# Patient Record
Sex: Female | Born: 1990 | Race: Black or African American | Hispanic: No | Marital: Single | Smoking: Never smoker
Health system: Southern US, Community
[De-identification: ages and names within clinical notes are randomized; demographics above are authoritative.]

## PROBLEM LIST (undated history)

## (undated) ENCOUNTER — Inpatient Hospital Stay (HOSPITAL_COMMUNITY): Payer: Self-pay

## (undated) ENCOUNTER — Emergency Department (HOSPITAL_COMMUNITY)

## (undated) DIAGNOSIS — F32A Depression, unspecified: Secondary | ICD-10-CM

## (undated) DIAGNOSIS — R51 Headache: Secondary | ICD-10-CM

## (undated) DIAGNOSIS — F419 Anxiety disorder, unspecified: Secondary | ICD-10-CM

## (undated) DIAGNOSIS — R55 Syncope and collapse: Secondary | ICD-10-CM

## (undated) DIAGNOSIS — D649 Anemia, unspecified: Secondary | ICD-10-CM

## (undated) DIAGNOSIS — G4700 Insomnia, unspecified: Secondary | ICD-10-CM

## (undated) DIAGNOSIS — Z3490 Encounter for supervision of normal pregnancy, unspecified, unspecified trimester: Secondary | ICD-10-CM

## (undated) DIAGNOSIS — R519 Headache, unspecified: Secondary | ICD-10-CM

## (undated) DIAGNOSIS — S62109A Fracture of unspecified carpal bone, unspecified wrist, initial encounter for closed fracture: Secondary | ICD-10-CM

## (undated) DIAGNOSIS — F329 Major depressive disorder, single episode, unspecified: Secondary | ICD-10-CM

## (undated) DIAGNOSIS — B999 Unspecified infectious disease: Secondary | ICD-10-CM

## (undated) DIAGNOSIS — G43909 Migraine, unspecified, not intractable, without status migrainosus: Secondary | ICD-10-CM

## (undated) HISTORY — PX: NO PAST SURGERIES: SHX2092

## (undated) HISTORY — DX: Anxiety disorder, unspecified: F41.9

## (undated) HISTORY — DX: Syncope and collapse: R55

## (undated) HISTORY — DX: Anemia, unspecified: D64.9

## (undated) HISTORY — DX: Fracture of unspecified carpal bone, unspecified wrist, initial encounter for closed fracture: S62.109A

## (undated) HISTORY — DX: Migraine, unspecified, not intractable, without status migrainosus: G43.909

## (undated) HISTORY — DX: Insomnia, unspecified: G47.00

## (undated) HISTORY — DX: Unspecified infectious disease: B99.9

---
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
## 2010-05-31 ENCOUNTER — Emergency Department (HOSPITAL_COMMUNITY)
Admission: EM | Admit: 2010-05-31 | Discharge: 2010-05-31 | Payer: Self-pay | Source: Home / Self Care | Admitting: Emergency Medicine

## 2010-06-07 LAB — POCT PREGNANCY, URINE: Preg Test, Ur: NEGATIVE

## 2011-02-15 LAB — POCT PREGNANCY, URINE
Operator id: 235561
Preg Test, Ur: NEGATIVE

## 2012-03-09 ENCOUNTER — Encounter (HOSPITAL_COMMUNITY): Payer: Self-pay | Admitting: *Deleted

## 2012-03-09 ENCOUNTER — Emergency Department (HOSPITAL_COMMUNITY)
Admission: EM | Admit: 2012-03-09 | Discharge: 2012-03-09 | Disposition: A | Discharge status: Home / Self Care | Payer: Medicaid Other | Attending: Emergency Medicine | Admitting: Emergency Medicine

## 2012-03-09 ENCOUNTER — Emergency Department (HOSPITAL_COMMUNITY): Payer: Medicaid Other

## 2012-03-09 DIAGNOSIS — O200 Threatened abortion: Secondary | ICD-10-CM

## 2012-03-09 DIAGNOSIS — O4690 Antepartum hemorrhage, unspecified, unspecified trimester: Secondary | ICD-10-CM | POA: Insufficient documentation

## 2012-03-09 HISTORY — DX: Encounter for supervision of normal pregnancy, unspecified, unspecified trimester: Z34.90

## 2012-03-09 LAB — POCT I-STAT, CHEM 8
BUN: 10 mg/dL (ref 6–23)
Calcium, Ion: 1.24 mmol/L — ABNORMAL HIGH (ref 1.12–1.23)
Chloride: 106 mEq/L (ref 96–112)
Creatinine, Ser: 0.9 mg/dL (ref 0.50–1.10)
Glucose, Bld: 90 mg/dL (ref 70–99)
HCT: 36 % (ref 36.0–46.0)
Hemoglobin: 12.2 g/dL (ref 12.0–15.0)
Potassium: 3.7 mEq/L (ref 3.5–5.1)
Sodium: 140 mEq/L (ref 135–145)
TCO2: 22 mmol/L (ref 0–100)

## 2012-03-09 LAB — WET PREP, GENITAL
Trich, Wet Prep: NONE SEEN
WBC, Wet Prep HPF POC: NONE SEEN
Yeast Wet Prep HPF POC: NONE SEEN

## 2012-03-09 LAB — CBC WITH DIFFERENTIAL/PLATELET
Basophils Absolute: 0 10*3/uL (ref 0.0–0.1)
Basophils Relative: 0 % (ref 0–1)
Eosinophils Absolute: 0.1 10*3/uL (ref 0.0–0.7)
Eosinophils Relative: 1 % (ref 0–5)
HCT: 32.1 % — ABNORMAL LOW (ref 36.0–46.0)
Hemoglobin: 10.4 g/dL — ABNORMAL LOW (ref 12.0–15.0)
Lymphocytes Relative: 30 % (ref 12–46)
Lymphs Abs: 1.8 10*3/uL (ref 0.7–4.0)
MCH: 24.4 pg — ABNORMAL LOW (ref 26.0–34.0)
MCHC: 32.4 g/dL (ref 30.0–36.0)
MCV: 75.2 fL — ABNORMAL LOW (ref 78.0–100.0)
Monocytes Absolute: 0.6 10*3/uL (ref 0.1–1.0)
Monocytes Relative: 11 % (ref 3–12)
Neutro Abs: 3.3 10*3/uL (ref 1.7–7.7)
Neutrophils Relative %: 57 % (ref 43–77)
Platelets: 307 10*3/uL (ref 150–400)
RBC: 4.27 MIL/uL (ref 3.87–5.11)
RDW: 13.7 % (ref 11.5–15.5)
WBC: 5.8 10*3/uL (ref 4.0–10.5)

## 2012-03-09 LAB — URINALYSIS, ROUTINE W REFLEX MICROSCOPIC
Bilirubin Urine: NEGATIVE
Glucose, UA: NEGATIVE mg/dL
Ketones, ur: NEGATIVE mg/dL
Nitrite: NEGATIVE
Protein, ur: NEGATIVE mg/dL
Specific Gravity, Urine: 1.024 (ref 1.005–1.030)
Urobilinogen, UA: 1 mg/dL (ref 0.0–1.0)
pH: 6.5 (ref 5.0–8.0)

## 2012-03-09 LAB — ABO/RH: ABO/RH(D): A POS

## 2012-03-09 LAB — HCG, QUANTITATIVE, PREGNANCY: hCG, Beta Chain, Quant, S: 14650 m[IU]/mL — ABNORMAL HIGH (ref <5)

## 2012-03-09 LAB — URINE MICROSCOPIC-ADD ON

## 2012-03-09 LAB — RPR: RPR Ser Ql: NONREACTIVE

## 2012-03-09 LAB — POCT PREGNANCY, URINE: Preg Test, Ur: POSITIVE — AB

## 2012-03-09 MED ORDER — PRENATAL VITAMINS (DIS) PO TABS: 1.0000 | ORAL_TABLET | Freq: Every morning | ORAL | Status: DC

## 2012-03-09 NOTE — ED Notes (Signed)
Took an at home pregnancy test on Monday and found out she was pregnant. Tonight noticed spotting when she wiped. LMP "beginning of September". Also reports some back aches. (Denies: fever, nvd, fever, weakness, sob, dizziness or other sx), has an appt with OBGYN tomorrow.

## 2012-03-09 NOTE — ED Provider Notes (Signed)
History     CSN: [id number]  Arrival date & time 03/09/12  0030   First MD Initiated Contact with Patient 03/09/12 [phone]      Chief Complaint  Patient presents with  . Vaginal Bleeding    (Consider location/radiation/quality/duration/timing/severity/associated sxs/prior treatment) HPI Complains of vaginal bleeding onset tonight. Slight amount, less than one pad. No other complaint no abdominal pain patient's: Pregnancy test 4 days ago which was positive. Last normal menstrual period approximately 6 weeks ago. No nausea or vomiting no abdominal pain no other complaint. No treatment prior to coming here no other associated symptoms Past Medical History  Diagnosis Date  . Pregnant    past medical history negative Past OB/GYN history gravida 2 para 0100 with one therapeutic abortion History reviewed. No pertinent past surgical history.  surgical history therapeutic abortion History reviewed. No pertinent family history.  History  Substance Use Topics  . Smoking status: Never Smoker   . Smokeless tobacco: Not on file  . Alcohol Use: No    OB History    Grav Para Term Preterm Abortions TAB SAB Ect Mult Living                  Review of Systems  Constitutional: Negative.   HENT: Negative.   Respiratory: Negative.   Cardiovascular: Negative.   Gastrointestinal: Negative.   Genitourinary: Positive for vaginal bleeding.  Musculoskeletal: Negative.   Skin: Negative.   Neurological: Negative.   Hematological: Negative.   Psychiatric/Behavioral: Negative.     Allergies  Review of patient's allergies indicates no known allergies.  Home Medications  No current outpatient prescriptions on file.  BP 123/75  Temp 98.7 F (37.1 C) (Oral)  Resp 18  SpO2 100%  LMP 01/25/2012  Physical Exam  Nursing note and vitals reviewed. Constitutional: She appears well-developed and well-nourished.  HENT:  Head: Normocephalic and atraumatic.  Eyes: Conjunctivae normal are normal.  Pupils are equal, round, and reactive to light.  Neck: Neck supple. No tracheal deviation present. No thyromegaly present.  Cardiovascular: Normal rate and regular rhythm.   No murmur heard. Pulmonary/Chest: Effort normal and breath sounds normal.  Abdominal: Soft. Bowel sounds are normal. She exhibits no distension. There is no tenderness.  Genitourinary:       No external lesion minimal blood in vault. No cervical motion tenderness. Cervical os closed. No adnexal masses or tenderness  Musculoskeletal: Normal range of motion. She exhibits no edema and no tenderness.  Neurological: She is alert. Coordination normal.  Skin: Skin is warm and dry. No rash noted.  Psychiatric: She has a normal mood and affect.    ED Course  Procedures (including critical care time)  Labs Reviewed  URINALYSIS, ROUTINE W REFLEX MICROSCOPIC - Abnormal; Notable for the following:    Hgb urine dipstick SMALL (*)     Leukocytes, UA SMALL (*)     All other components within normal limits  CBC WITH DIFFERENTIAL - Abnormal; Notable for the following:    Hemoglobin 10.4 (*)     HCT 32.1 (*)     MCV 75.2 (*)     MCH 24.4 (*)     All other components within normal limits  POCT PREGNANCY, URINE - Abnormal; Notable for the following:    Preg Test, Ur POSITIVE (*)     All other components within normal limits  POCT I-STAT, CHEM 8 - Abnormal; Notable for the following:    Calcium, Ion 1.24 (*)     All other components within  normal limits  URINE MICROSCOPIC-ADD ON   No results found.  Results for orders placed during the hospital encounter of 03/09/12  URINALYSIS, ROUTINE W REFLEX MICROSCOPIC      Component Value Range   Color, Urine YELLOW  YELLOW   APPearance CLEAR  CLEAR   Specific Gravity, Urine 1.024  1.005 - 1.030   pH 6.5  5.0 - 8.0   Glucose, UA NEGATIVE  NEGATIVE mg/dL   Hgb urine dipstick SMALL (*) NEGATIVE   Bilirubin Urine NEGATIVE  NEGATIVE   Ketones, ur NEGATIVE  NEGATIVE mg/dL   Protein,  ur NEGATIVE  NEGATIVE mg/dL   Urobilinogen, UA 1.0  0.0 - 1.0 mg/dL   Nitrite NEGATIVE  NEGATIVE   Leukocytes, UA SMALL (*) NEGATIVE  CBC WITH DIFFERENTIAL      Component Value Range   WBC 5.8  4.0 - 10.5 K/uL   RBC 4.27  3.87 - 5.11 MIL/uL   Hemoglobin 10.4 (*) 12.0 - 15.0 g/dL   HCT 16.1 (*) 09.6 - 04.5 %   MCV 75.2 (*) 78.0 - 100.0 fL   MCH 24.4 (*) 26.0 - 34.0 pg   MCHC 32.4  30.0 - 36.0 g/dL   RDW 40.9  81.1 - 91.4 %   Platelets 307  150 - 400 K/uL   Neutrophils Relative 57  43 - 77 %   Neutro Abs 3.3  1.7 - 7.7 K/uL   Lymphocytes Relative 30  12 - 46 %   Lymphs Abs 1.8  0.7 - 4.0 K/uL   Monocytes Relative 11  3 - 12 %   Monocytes Absolute 0.6  0.1 - 1.0 K/uL   Eosinophils Relative 1  0 - 5 %   Eosinophils Absolute 0.1  0.0 - 0.7 K/uL   Basophils Relative 0  0 - 1 %   Basophils Absolute 0.0  0.0 - 0.1 K/uL  POCT PREGNANCY, URINE      Component Value Range   Preg Test, Ur POSITIVE (*) NEGATIVE  POCT I-STAT, CHEM 8      Component Value Range   Sodium 140  135 - 145 mEq/L   Potassium 3.7  3.5 - 5.1 mEq/L   Chloride 106  96 - 112 mEq/L   BUN 10  6 - 23 mg/dL   Creatinine, Ser 7.82  0.50 - 1.10 mg/dL   Glucose, Bld 90  70 - 99 mg/dL   Calcium, Ion 9.56 (*) 1.12 - 1.23 mmol/L   TCO2 22  0 - 100 mmol/L   Hemoglobin 12.2  12.0 - 15.0 g/dL   HCT 21.3  08.6 - 57.8 %  URINE MICROSCOPIC-ADD ON      Component Value Range   Squamous Epithelial / LPF RARE  RARE   WBC, UA 3-6  <3 WBC/hpf   RBC / HPF 0-2  <3 RBC/hpf   Bacteria, UA RARE  RARE  WET PREP, GENITAL      Component Value Range   Yeast Wet Prep HPF POC NONE SEEN  NONE SEEN   Trich, Wet Prep NONE SEEN  NONE SEEN   Clue Cells Wet Prep HPF POC FEW (*) NONE SEEN   WBC, Wet Prep HPF POC NONE SEEN  NONE SEEN  ABO/RH      Component Value Range   ABO/RH(D) A POS     No rh immune globuloin NOT A RH IMMUNE GLOBULIN CANDIDATE, PT RH POSITIVE    HCG, QUANTITATIVE, PREGNANCY      Component Value Range  hCG, Beta Chain,  Quant, S 14650 (*) <5 mIU/mL   US Ob Comp Less 14 Wks  03/09/2012  *RADIOLOGY REPORT*  Clinical Data: Vaginal bleeding last night.  Positive urine pregnancy test.  Estimated gestational age by LMP is 6 weeks 3 days.  OBSTETRIC <14 WK [place] AND TRANSVAGINAL OB US  Technique:  Both transabdominal and transvaginal ultrasound examinations were performed for complete evaluation of the gestation as well as the maternal uterus, adnexal regions, and pelvic cul-de-sac.  Transvaginal technique was performed to assess early pregnancy.  Comparison:  None.  Intrauterine gestational sac:  A single intrauterine gestational sac is visualized. Yolk sac: Yolk sac is visualized. Embryo: Embryo is not visualized. Cardiac Activity: Not visualized Heart Rate: N/A bpm  MSD: 10 mm  5 w 5 d           [place] EDC: 11/04/2012  Maternal uterus/adnexae: The uterus is anteverted.  No myometrial masses are demonstrated. No subchorionic hemorrhage.  Both ovaries are visualized and appear normal in size and shape.  No abnormal adnexal masses.  IMPRESSION: Single intrauterine gestational sac.  Estimated gestational age by mean sac diameter is 5 weeks 5 days.  The yolk sac is visualized but the fetal pole is not visualized.  This is likely due to early gestational age.  Follow-up is recommended with serial quantitative beta HCG levels and / or short-term ultrasound in 10 days as clinically indicated.   Original Report Authenticated By: Marlon Pel, M.D.    US Ob Transvaginal  03/09/2012  *RADIOLOGY REPORT*  Clinical Data: Vaginal bleeding last night.  Positive urine pregnancy test.  Estimated gestational age by LMP is 6 weeks 3 days.  OBSTETRIC <14 WK [place] AND TRANSVAGINAL OB US  Technique:  Both transabdominal and transvaginal ultrasound examinations were performed for complete evaluation of the gestation as well as the maternal uterus, adnexal regions, and pelvic cul-de-sac.  Transvaginal technique was performed to assess early pregnancy.   Comparison:  None.  Intrauterine gestational sac:  A single intrauterine gestational sac is visualized. Yolk sac: Yolk sac is visualized. Embryo: Embryo is not visualized. Cardiac Activity: Not visualized Heart Rate: N/A bpm  MSD: 10 mm  5 w 5 d           [place] EDC: 11/04/2012  Maternal uterus/adnexae: The uterus is anteverted.  No myometrial masses are demonstrated. No subchorionic hemorrhage.  Both ovaries are visualized and appear normal in size and shape.  No abnormal adnexal masses.  IMPRESSION: Single intrauterine gestational sac.  Estimated gestational age by mean sac diameter is 5 weeks 5 days.  The yolk sac is visualized but the fetal pole is not visualized.  This is likely due to early gestational age.  Follow-up is recommended with serial quantitative beta HCG levels and / or short-term ultrasound in 10 days as clinically indicated.   Original Report Authenticated By: Marlon Pel, M.D.     No diagnosis found.  5:50 AM patient resting comfortably, asymptomatic  MDM  Patient has her first prenatal visit scheduled within the next day. Plan prescription prenatal vitamins Diagnosis threatened miscarriage        Doug Sou, MD 03/09/12 [id number]

## 2012-03-10 LAB — GC/CHLAMYDIA PROBE AMP, GENITAL
Chlamydia, DNA Probe: POSITIVE — AB
GC Probe Amp, Genital: NEGATIVE

## 2012-03-11 NOTE — ED Notes (Signed)
+  Chlamydia. Chart sent to EDP office for review. DHHS attached. 

## 2012-03-13 NOTE — ED Notes (Signed)
Prescription for azithromycin gm 1 po single dose, no refills per [facility], pa-c called in to [facility] at [id number].

## 2012-05-07 ENCOUNTER — Ambulatory Visit (INDEPENDENT_AMBULATORY_CARE_PROVIDER_SITE_OTHER): Payer: Medicaid Other | Admitting: Obstetrics and Gynecology

## 2012-05-07 DIAGNOSIS — Z331 Pregnant state, incidental: Secondary | ICD-10-CM

## 2012-05-07 DIAGNOSIS — Z3689 Encounter for other specified antenatal screening: Secondary | ICD-10-CM

## 2012-05-07 LAB — POCT URINALYSIS DIPSTICK
Bilirubin, UA: NEGATIVE
Blood, UA: NEGATIVE
Glucose, UA: NEGATIVE
Ketones, UA: NEGATIVE
Spec Grav, UA: <=1.005
Urobilinogen, UA: NEGATIVE
pH, UA: 8

## 2012-05-07 MED ORDER — PROMETHAZINE HCL 25 MG PO TABS: 25.0000 mg | ORAL_TABLET | Freq: Four times a day (QID) | ORAL | Status: DC | PRN

## 2012-05-07 NOTE — Progress Notes (Signed)
NOB interview. U/S from [facility]  03/09/12 results reviewed by phone with VL.  Advised FHT check.  FHT audible at 144 BPM. Pt requesting Rx for nausea.

## 2012-05-08 LAB — PRENATAL PANEL VII
Antibody Screen: NEGATIVE
Basophils Absolute: 0 10*3/uL (ref 0.0–0.1)
Basophils Relative: 0 % (ref 0–1)
Eosinophils Absolute: 0.1 10*3/uL (ref 0.0–0.7)
Eosinophils Relative: 1 % (ref 0–5)
HCT: 35.3 % — ABNORMAL LOW (ref 36.0–46.0)
HIV: NONREACTIVE
Hemoglobin: 11.2 g/dL — ABNORMAL LOW (ref 12.0–15.0)
Hepatitis B Surface Ag: NEGATIVE
Lymphocytes Relative: 22 % (ref 12–46)
Lymphs Abs: 1.3 10*3/uL (ref 0.7–4.0)
MCH: 24.4 pg — ABNORMAL LOW (ref 26.0–34.0)
MCHC: 31.7 g/dL (ref 30.0–36.0)
MCV: 76.9 fL — ABNORMAL LOW (ref 78.0–100.0)
Monocytes Absolute: 0.6 10*3/uL (ref 0.1–1.0)
Monocytes Relative: 10 % (ref 3–12)
Neutro Abs: 4 10*3/uL (ref 1.7–7.7)
Neutrophils Relative %: 67 % (ref 43–77)
Platelets: 309 10*3/uL (ref 150–400)
RBC: 4.59 MIL/uL (ref 3.87–5.11)
RDW: 14.9 % (ref 11.5–15.5)
Rh Type: POSITIVE
Rubella: 3.65 Index — ABNORMAL HIGH (ref <0.90)
WBC: 6 10*3/uL (ref 4.0–10.5)

## 2012-05-09 LAB — CULTURE, OB URINE
Colony Count: NO GROWTH
Organism ID, Bacteria: NO GROWTH

## 2012-05-09 LAB — HEMOGLOBINOPATHY EVALUATION
Hemoglobin Other: 0 %
Hgb A2 Quant: 2.5 % (ref 2.2–3.2)
Hgb A: 97.5 % (ref 96.8–97.8)
Hgb F Quant: 0 % (ref 0.0–2.0)
Hgb S Quant: 0 %

## 2012-05-15 ENCOUNTER — Encounter (HOSPITAL_COMMUNITY): Payer: Self-pay | Admitting: *Deleted

## 2012-05-15 ENCOUNTER — Telehealth: Payer: Self-pay | Admitting: Obstetrics and Gynecology

## 2012-05-15 ENCOUNTER — Emergency Department (HOSPITAL_COMMUNITY)
Admission: EM | Admit: 2012-05-15 | Discharge: 2012-05-15 | Disposition: A | Discharge status: Home / Self Care | Payer: Medicaid Other | Source: Home / Self Care | Attending: Emergency Medicine | Admitting: Emergency Medicine

## 2012-05-15 DIAGNOSIS — J111 Influenza due to unidentified influenza virus with other respiratory manifestations: Secondary | ICD-10-CM

## 2012-05-15 LAB — POCT RAPID STREP A: Streptococcus, Group A Screen (Direct): NEGATIVE

## 2012-05-15 MED ORDER — OSELTAMIVIR PHOSPHATE 75 MG PO CAPS
75.0000 mg | ORAL_CAPSULE | Freq: Two times a day (BID) | ORAL | Status: DC
Start: 2012-05-15 — End: 2012-08-05

## 2012-05-15 MED ORDER — CHLORPHENIRAMINE TANNATE 12 MG PO TABS: 1.0000 | ORAL_TABLET | Freq: Two times a day (BID) | ORAL | Status: DC

## 2012-05-15 NOTE — ED Provider Notes (Signed)
Chief Complaint  Patient presents with  . Sore Throat    History of Present Illness:   The patient is a 21 year old female who is [redacted] weeks pregnant. She presents today with a three-day history of dry cough, aching in her chest, fever of up to 101.3, myalgias, sore throat, nasal congestion, and rhinorrhea. She has had no pregnancy complications. She's been followed by [facility] and Gynecology. Her only medication is prenatal vitamins.  Review of Systems:  Other than noted above, the patient denies any of the following symptoms. Systemic:  No fever, chills, sweats, fatigue, myalgias, headache, or anorexia. Eye:  No redness, pain or drainage. ENT:  No earache, ear congestion, nasal congestion, sneezing, rhinorrhea, sinus pressure, sinus pain, post nasal drip, or sore throat. Lungs:  No cough, sputum production, wheezing, shortness of breath, or chest pain. GI:  No abdominal pain, nausea, vomiting, or diarrhea.  PMFSH:  Past medical history, family history, social history, meds, and allergies were reviewed.  Physical Exam:   Vital signs:  BP 120/80  Pulse 98  Temp 98.6 F (37 C) (Oral)  SpO2 100%  LMP 01/29/2012 General:  Alert, in no distress. Eye:  No conjunctival injection or drainage. Lids were normal. ENT:  TMs and canals were normal, without erythema or inflammation.  Nasal mucosa was clear and uncongested, without drainage.  Mucous membranes were moist.  Pharynx was clear, without exudate or drainage.  There were no oral ulcerations or lesions. Neck:  Supple, no adenopathy, tenderness or mass. Lungs:  No respiratory distress.  Lungs were clear to auscultation, without wheezes, rales or rhonchi.  Breath sounds were clear and equal bilaterally.  Heart:  Regular rhythm, without gallops, murmers or rubs. Skin:  Clear, warm, and dry, without rash or lesions.  Labs:   Results for orders placed during the hospital encounter of 05/15/12  POCT RAPID STREP A ([facility] URG CARE  ONLY)      Component Value Range   Streptococcus, Group A Screen (Direct) NEGATIVE  NEGATIVE    Assessment:  The encounter diagnosis was Influenza-like illness.  Plan:   1.  The following meds were prescribed:   New Prescriptions   CHLORPHENIRAMINE TANNATE 12 MG TABS    Take 1 tablet (12 mg total) by mouth 2 (two) times daily at 10 AM and 5 PM.   OSELTAMIVIR (TAMIFLU) 75 MG CAPSULE    Take 1 capsule (75 mg total) by mouth every 12 (twelve) hours.   2.  The patient was instructed in symptomatic care and handouts were given. 3.  The patient was told to return if becoming worse in any way, if no better in 3 or 4 days, and given some red flag symptoms that would indicate earlier return.   Reuben Likes, MD 05/15/12 1311

## 2012-05-15 NOTE — ED Notes (Signed)
Pt  Reports  Symptoms  Of  sorethroat   Cough          Congested       Symptoms  X  4 days       Pt  Is  pregnanat  About 4  Months     -  She  Is   Awake  And  Alert  And  Oriented  And  Is  Speaking in  Complete  sentances

## 2012-05-15 NOTE — Telephone Encounter (Signed)
Returned pt's call. States 04/2312 had T-101.3 and today is 100.2. Questioning if OK to take Mucinex. Advised is OK. May also take Delsym for cough. Pt states has sore throat which makes it difficult to eat but is drinking fluids. Suggested saline gargles, Tylenol or Ibuprofen. Consult with ML. Pt advised to be seen at [facility] today. Pt verbalizes comprehension.

## 2012-05-23 NOTE — L&D Delivery Note (Signed)
Delivery Note At 4:44 AM a viable female, "Anne Baxter", was delivered via Vaginal, Spontaneous Delivery (Presentation: ; Occiput Anterior).  APGAR: 8, 9; weight .   Placenta status: Intact, Spontaneous.  Cord:  with the following complications: None.  Cord pH: NA  Anesthesia: Epidural  Episiotomy: None Lacerations: Periurethral Suture Repair: 3.0 monocryl Est. Blood Loss (mL):  250 cc  Mom to postpartum.  Baby to skin to skin.Marland Kitchen  Filipe Greathouse 10/31/2012, 5:09 AM

## 2012-06-07 ENCOUNTER — Encounter: Payer: Self-pay | Admitting: Obstetrics and Gynecology

## 2012-06-07 ENCOUNTER — Ambulatory Visit: Payer: Medicaid Other | Admitting: Obstetrics and Gynecology

## 2012-06-07 ENCOUNTER — Ambulatory Visit: Payer: Medicaid Other

## 2012-06-07 VITALS — BP 130/70 | Wt 126.0 lb

## 2012-06-07 DIAGNOSIS — Z331 Pregnant state, incidental: Secondary | ICD-10-CM

## 2012-06-07 DIAGNOSIS — Z3689 Encounter for other specified antenatal screening: Secondary | ICD-10-CM

## 2012-06-07 LAB — POCT WET PREP (WET MOUNT): Clue Cells Wet Prep Whiff POC: POSITIVE

## 2012-06-07 MED ORDER — METRONIDAZOLE 0.75 % VA GEL: 1.0000 | Freq: Every day | VAGINAL | Status: DC

## 2012-06-07 NOTE — Progress Notes (Signed)
[facility] NEW OB EXAMINATION   Anne Baxter is a 22 y.o. female, G2P0010, who presents at [redacted]w[redacted]d gestation for a new obstetrical examination. The patient was treated with Tamiflu for influenza symptoms.  She is better now.  The patient had an ultrasound performed on March 09, 2012 at which time she was 5 weeks and 5 days pregnant.  Her due date is November 04, 2012.  The patient complains of headaches.  She has not taken medication.  She did not have headaches prior to her pregnancy.  The following portions of the patient's history were reviewed and updated as appropriate: allergies, current medications, past family history, past medical history, past social history, past surgical history and problem list.  OB History    Grav Para Term Preterm Abortions TAB SAB Ect Mult Living   2    1           Past Medical History  Diagnosis Date  . Pregnant   . Infection 02/2017    CHLAMYDIA  . Anemia 2010    FE  . Wrist fracture 2009 X2    No past surgical history on file.  Family History  Problem Relation Age of Onset  . Kidney disease Mother     FAILURE  . Heart disease Mother     CHF  . Early death Mother 35  . Hypertension Mother   . Thrombophlebitis Mother   . Hyperlipidemia Maternal Grandmother   . Hypertension Maternal Grandmother     Social History:  reports that she has never smoked. She has never used smokeless tobacco. She reports that she drinks alcohol. She reports that she does not use illicit drugs.  Allergies: No Known Allergies  Medications: prenatal vitamins   Objective:    BP 130/70  Wt 126 lb (57.153 kg)  LMP 01/29/2012    Weight:  Wt Readings from Last 1 Encounters:  06/07/12 126 lb (57.153 kg)          BMI: There is no height on file to calculate BMI.  General Appearance: Alert, appropriate appearance for age. No acute distress HEENT: Grossly normal Neck / Thyroid: Supple, no masses, nodes or enlargement Lungs: clear to auscultation  bilaterally Back: No CVA tenderness Breast Exam: No masses or nodes.No dimpling, nipple retraction or discharge. Cardiovascular: Regular rate and rhythm. S1, S2, no murmur Gastrointestinal: Soft, non-tender, no masses or organomegaly.                               Fundal height: 18 weeks                               Fetal heart tones audible: yes  ++++++++++++++++++++++++++++++++++++++++++++++++++++++++  Pelvic Exam: External genitalia: normal general appearance Vaginal: normal without tenderness, induration or masses and relaxation: No Cervix: normal appearance Adnexa: normal bimanual exam Uterus: gravid, nontender, 18 weeks size  ++++++++++++++++++++++++++++++++++++++++++++++++++++++++  Lymphatic Exam: Non-palpable nodes in neck, clavicular, axillary, or inguinal regions Neurologic: Normal speech, no tremor  Psychiatric: Alert and oriented, appropriate affect.  Prenatal labs: ABO, Rh: A/POS/-- (12/16 0932) Antibody: NEG (12/16 0932) Rubella:  immune RPR: NON REAC (12/16 0932)  HBsAg: NEGATIVE (12/16 0932)  HIV: NON REACTIVE (12/16 0932)  GBS:   pending until the third trimester Hemoglobin: 11.2 Platelets: 309,000 Sickle cell screen: normal adult hemoglobin Urine culture: Negative  Anatomy ultrasound: Single gestation, normal fluid, normal anatomy, female  infant, 240 g (43rd percentile), cervix 3.43 cm.  Wet Prep:           If Whiff:                     Positive                               Clue cells:             yes                              PH:                        4.5                              Yeast:                    no                              Trichomoniasis:    no   Assessment:   22 y.o. female G2P0010 at [redacted]w[redacted]d gestation ( EDC is November 04, 2012 ) by: Normal Last menstrual period: yes Ultrasound:                               yes                                Late prenatal care  Bacterial vaginosis   Plan:    GC and Chlamydia sent.  Pap  smear sent  MetroGel vaginal each night for 5 nights  We discussed routine pregnancy issues:  Toxoplasmosis was reviewed.  The patient was told to avoid cat liter boxes and feces.  The patient was told to avoid predator fish including tuna because of our concerns for mercury consumption.  The patient was told to avoid soft cheeses.  The patient was told to be sure that all lunch meats are well cooked.  Genetic screening was discussed. Quad screen today  Our model for pregnancy management was reviewed.  Proper diet and exercise reviewed.  Return to office in 4 weeks.  Medications include:  Prenatal vitamins MetroGel vaginal  Leonard Schwartz M.D.

## 2012-06-07 NOTE — Progress Notes (Signed)
Pt c/o daily headaches ?

## 2012-06-08 LAB — AFP, QUAD SCREEN
AFP: 36.2 IU/mL
Age Alone: 1:1150 {titer}
Curr Gest Age: 18.4 wks.days
Down Syndrome Scr Risk Est: 1:4150 {titer}
HCG, Total: 22929 m[IU]/mL
INH: 158.9 pg/mL
Interpretation-AFP: NEGATIVE
MoM for AFP: 0.74
MoM for INH: 0.84
MoM for hCG: 1.17
Open Spina bifida: NEGATIVE
Osb Risk: <1:54600 {titer}
Tri 18 Scr Risk Est: NEGATIVE
Trisomy 18 (Edward) Syndrome Interp.: 1:63400 {titer}
uE3 Mom: 0.97
uE3 Value: 1 ng/mL

## 2012-06-08 LAB — US OB COMP + 14 WK

## 2012-06-11 LAB — PAP IG, CT-NG, RFX HPV ASCU
Chlamydia Probe Amp: NEGATIVE
GC Probe Amp: NEGATIVE

## 2012-06-14 ENCOUNTER — Encounter: Payer: Self-pay | Admitting: Obstetrics and Gynecology

## 2012-06-14 DIAGNOSIS — IMO0002 Reserved for concepts with insufficient information to code with codable children: Secondary | ICD-10-CM | POA: Insufficient documentation

## 2012-07-05 ENCOUNTER — Encounter: Payer: Medicaid Other | Admitting: Obstetrics and Gynecology

## 2012-07-16 ENCOUNTER — Ambulatory Visit: Payer: Medicaid Other | Admitting: Obstetrics and Gynecology

## 2012-07-16 ENCOUNTER — Encounter: Payer: Self-pay | Admitting: Obstetrics and Gynecology

## 2012-07-16 VITALS — BP 128/60 | Wt 134.0 lb

## 2012-07-16 DIAGNOSIS — Z3490 Encounter for supervision of normal pregnancy, unspecified, unspecified trimester: Secondary | ICD-10-CM

## 2012-07-16 DIAGNOSIS — Z331 Pregnant state, incidental: Secondary | ICD-10-CM

## 2012-07-16 DIAGNOSIS — Z23 Encounter for immunization: Secondary | ICD-10-CM

## 2012-07-16 NOTE — Progress Notes (Signed)
[redacted]w[redacted]d Pt would like rx to help her sleep. Can use otc benadryl Glucola given today by CMA.  Pt opts to get blood drawn today rather than at 28 wks.  Will not repeat Hgb or RPR today.  Will do both after 30 weeks Flu vaccine given.  Pt had flu sx earlier in preg and took Tamiflu.  Explained that vaccination can still be useful.  She accepts flu vaccine today.

## 2012-07-17 LAB — GLUCOSE TOLERANCE, 1 HOUR (50G) W/O FASTING: Glucose, 1 Hour GTT: 96 mg/dL (ref 70–140)

## 2012-08-05 ENCOUNTER — Inpatient Hospital Stay (HOSPITAL_COMMUNITY)
Admission: AD | Admit: 2012-08-05 | Discharge: 2012-08-05 | Disposition: A | Discharge status: Home / Self Care | Payer: Medicaid Other | Source: Ambulatory Visit | Attending: Obstetrics and Gynecology | Admitting: Obstetrics and Gynecology

## 2012-08-05 ENCOUNTER — Encounter (HOSPITAL_COMMUNITY): Payer: Self-pay | Admitting: *Deleted

## 2012-08-05 ENCOUNTER — Inpatient Hospital Stay (HOSPITAL_COMMUNITY): Payer: Medicaid Other

## 2012-08-05 DIAGNOSIS — R51 Headache: Secondary | ICD-10-CM | POA: Insufficient documentation

## 2012-08-05 DIAGNOSIS — M549 Dorsalgia, unspecified: Secondary | ICD-10-CM | POA: Insufficient documentation

## 2012-08-05 DIAGNOSIS — O368190 Decreased fetal movements, unspecified trimester, not applicable or unspecified: Secondary | ICD-10-CM | POA: Insufficient documentation

## 2012-08-05 LAB — URINALYSIS, ROUTINE W REFLEX MICROSCOPIC
Bilirubin Urine: NEGATIVE
Glucose, UA: NEGATIVE mg/dL
Hgb urine dipstick: NEGATIVE
Ketones, ur: NEGATIVE mg/dL
Leukocytes, UA: NEGATIVE
Nitrite: NEGATIVE
Protein, ur: NEGATIVE mg/dL
Specific Gravity, Urine: 1.015 (ref 1.005–1.030)
Urobilinogen, UA: 0.2 mg/dL (ref 0.0–1.0)
pH: 7.5 (ref 5.0–8.0)

## 2012-08-05 MED ORDER — ACETAMINOPHEN 500 MG PO TABS
1000.0000 mg | ORAL_TABLET | Freq: Once | ORAL | Status: AC
  Administered 2012-08-05: 1000 mg via ORAL
  Filled 2012-08-05: qty 2

## 2012-08-05 NOTE — MAU Note (Signed)
Pt presents with back pain, headache, and decrease in fetal movement. States she has been really stressed and not really felt movement since last night

## 2012-08-05 NOTE — MAU Provider Note (Signed)
History     CSN: [id number]  Arrival date and time: 08/05/12 1241   None     Chief Complaint  Patient presents with  . Decreased Fetal Movement  . Headache  . Back Pain   HPI Comments: Pt is a G2P0 at 27wks that arrived unannounced w cc decreased FM, reports not feeling any movement today. About 30 minutes after EFM initiated, pt stated movement now feeling like usual. She also c/o headache today,  has not tried any OTC tx, last meal "a couple hours"  before arrival. Also c/o backache, points to mid upper back and between shoulder blades. Denies any ctx, VB or LOF.   Headache  Associated symptoms include back pain. Pertinent negatives include no abdominal pain, blurred vision, dizziness, nausea, photophobia or vomiting.  Back Pain Associated symptoms include headaches. Pertinent negatives include no abdominal pain or chest pain.      Past Medical History  Diagnosis Date  . Pregnant   . Infection 02/2017    CHLAMYDIA  . Anemia 2010    FE  . Wrist fracture 2009 X2    History reviewed. No pertinent past surgical history.  Family History  Problem Relation Age of Onset  . Kidney disease Mother     FAILURE  . Heart disease Mother     CHF  . Early death Mother 85  . Hypertension Mother   . Thrombophlebitis Mother   . Hyperlipidemia Maternal Grandmother   . Hypertension Maternal Grandmother     History  Substance Use Topics  . Smoking status: Never Smoker   . Smokeless tobacco: Never Used  . Alcohol Use: Yes     Comment: RARELY    Allergies: No Known Allergies  Prescriptions prior to admission  Medication Sig Dispense Refill  . Prenatal Vitamins (DIS) TABS Take 1 tablet by mouth every morning.  30 tablet  0    Review of Systems  Eyes: Negative for blurred vision, double vision and photophobia.  Respiratory: Negative for shortness of breath.   Cardiovascular: Negative for chest pain.  Gastrointestinal: Negative for heartburn, nausea, vomiting and abdominal  pain.  Musculoskeletal: Positive for back pain.  Neurological: Positive for headaches. Negative for dizziness.  All other systems reviewed and are negative.   Physical Exam   Blood pressure 121/76, pulse 76, temperature 98.2 F (36.8 C), resp. rate 18, height 5' 4.5" (1.638 m), weight 138 lb (62.596 kg), last menstrual period 01/29/2012.  Physical Exam  Nursing note and vitals reviewed. Constitutional: She is oriented to [place], place, and time. She appears well-developed and well-nourished.  HENT:  Head: Normocephalic.  Eyes: Pupils are equal, round, and reactive to light.  Neck: Normal range of motion.  Cardiovascular: Normal rate.   Respiratory: Effort normal.  GI: Soft. There is no tenderness.  Genitourinary:  Deferred   Musculoskeletal: Normal range of motion. She exhibits no edema and no tenderness.  Neurological: She is alert and oriented to [place], place, and time. She has normal reflexes.  Skin: Skin is warm and dry.  Psychiatric: She has a normal mood and affect. Her behavior is normal.   ? decel upon initiation of EFM, FHR baseline 150, several spontaneous variables noted w lowest nadir to 130, lasting approx 40-60 sec.  Mod variability, non-reactive Just Prior to [place] and after maternal position change, variables resolved and FHR reactive cat 1 toco - quiet  [place] - S=D Breech  BPP 6/8, fetal breathing noted, but not-sustained AFI-11cm - 19%  MAU Course  Procedures  Assessment and Plan  IUP at 27wks FHR initially cat 2 BPP 6/8  Headache  PO tylenol   Kailin Leu M 08/05/2012, 5:22 PM   Addendum: at 1815  S: pt denies headache, reports normal fetal movements since arrival   O: FHR cat 1, reactive for gestational age toco - quiet  A: IUP at 27wks FHR overall reassuring w BPP 6/8 No evidence of pre-eclampsia  P: c/w Dr Normand Sloop Pt offered further monitoring w repeat of BPP at 6 hours, she declined rv'd [facility]KC, sx's pre-eclampsia and PTL Pt dc'd  home in stable condition  Instructions for routine follow up as scheduled   S.Antwion Carpenter, CNM

## 2012-09-15 ENCOUNTER — Inpatient Hospital Stay (HOSPITAL_COMMUNITY)
Admission: AD | Admit: 2012-09-15 | Discharge: 2012-09-15 | Disposition: A | Discharge status: Home / Self Care | Payer: Medicaid Other | Source: Ambulatory Visit | Attending: Obstetrics and Gynecology | Admitting: Obstetrics and Gynecology

## 2012-09-15 ENCOUNTER — Encounter (HOSPITAL_COMMUNITY): Payer: Self-pay

## 2012-09-15 DIAGNOSIS — A499 Bacterial infection, unspecified: Secondary | ICD-10-CM | POA: Insufficient documentation

## 2012-09-15 DIAGNOSIS — R109 Unspecified abdominal pain: Secondary | ICD-10-CM | POA: Insufficient documentation

## 2012-09-15 DIAGNOSIS — N760 Acute vaginitis: Secondary | ICD-10-CM | POA: Insufficient documentation

## 2012-09-15 DIAGNOSIS — O4700 False labor before 37 completed weeks of gestation, unspecified trimester: Secondary | ICD-10-CM | POA: Insufficient documentation

## 2012-09-15 DIAGNOSIS — O2390 Unspecified genitourinary tract infection in pregnancy, unspecified trimester: Secondary | ICD-10-CM | POA: Insufficient documentation

## 2012-09-15 DIAGNOSIS — B9689 Other specified bacterial agents as the cause of diseases classified elsewhere: Secondary | ICD-10-CM | POA: Insufficient documentation

## 2012-09-15 LAB — OB RESULTS CONSOLE GC/CHLAMYDIA: Chlamydia: NEGATIVE

## 2012-09-15 LAB — URINALYSIS, ROUTINE W REFLEX MICROSCOPIC
Bilirubin Urine: NEGATIVE
Glucose, UA: NEGATIVE mg/dL
Hgb urine dipstick: NEGATIVE
Ketones, ur: 15 mg/dL — AB
Nitrite: NEGATIVE
Protein, ur: NEGATIVE mg/dL
Specific Gravity, Urine: >1.03 — ABNORMAL HIGH (ref 1.005–1.030)
Urobilinogen, UA: 1 mg/dL (ref 0.0–1.0)
pH: 6 (ref 5.0–8.0)

## 2012-09-15 LAB — WET PREP, GENITAL
Trich, Wet Prep: NONE SEEN
Yeast Wet Prep HPF POC: NONE SEEN

## 2012-09-15 LAB — URINE MICROSCOPIC-ADD ON

## 2012-09-15 LAB — FETAL FIBRONECTIN: Fetal Fibronectin: NEGATIVE

## 2012-09-15 MED ORDER — METRONIDAZOLE 500 MG PO TABS: 500.0000 mg | ORAL_TABLET | Freq: Two times a day (BID) | ORAL | Status: DC

## 2012-09-15 NOTE — MAU Provider Note (Signed)
History    CSN: [id number]  Arrival date and time: 09/15/12 2020   None    Chief Complaint  Patient presents with  . Abdominal Cramping   HPI Pt presents to MAU at 32w 6d with c/o lower abdominal cramping and vaginal pressure which began earlier in the evening.  She had also noted some during the day as well.  Reports the pain lasts for a few seconds to a minute and has occurred intermittently but denies occuring more than 6x/hr.  Denies ROM or bldg.  Reports active fetus.  Denies any intercourse over the past 48hrs.  Denies any significantly increased vaginal discharge, itching, burning or odor.  Denies any recent change in sexual partners.  No UTI s/s.    OB History   Grav Para Term Preterm Abortions TAB SAB Ect Mult Living   2    1     0      Past Medical History  Diagnosis Date  . Pregnant   . Infection 02/2017    CHLAMYDIA  . Anemia 2010    FE  . Wrist fracture 2009 X2    History reviewed. No pertinent past surgical history.  Family History  Problem Relation Age of Onset  . Kidney disease Mother     FAILURE  . Heart disease Mother     CHF  . Early death Mother 24  . Hypertension Mother   . Thrombophlebitis Mother   . Hyperlipidemia Maternal Grandmother   . Hypertension Maternal Grandmother     History  Substance Use Topics  . Smoking status: Never Smoker   . Smokeless tobacco: Never Used  . Alcohol Use: Yes     Comment: RARELY    Allergies: No Known Allergies  Prescriptions prior to admission  Medication Sig Dispense Refill  . Prenatal Vitamins (DIS) TABS Take 1 tablet by mouth every morning.  30 tablet  0    Review of Systems  Constitutional: Negative.   HENT: Negative.   Eyes: Negative.   Respiratory: Negative.   Cardiovascular: Negative.   Gastrointestinal: Positive for nausea and constipation. Negative for heartburn, vomiting, abdominal pain, diarrhea, blood in stool and melena.       Occas nausea but no vomiting.  Genitourinary: Negative.    Musculoskeletal: Negative.   Skin: Negative.   Neurological: Negative.   Endo/Heme/Allergies: Negative.   Psychiatric/Behavioral: Negative.    Physical Exam   Blood pressure 128/72, pulse 106, temperature 98 F (36.7 C), temperature source Oral, resp. rate 20, height 5' 6.5" (1.689 m), weight 146 lb 9.6 oz (66.497 kg), last menstrual period 01/29/2012.  Physical Exam  Constitutional: She is oriented to [place], place, and time. She appears well-developed and well-nourished.  HENT:  Head: Normocephalic and atraumatic.  Right Ear: External ear normal.  Left Ear: External ear normal.  Eyes: Conjunctivae and EOM are normal. Pupils are equal, round, and reactive to light.  Neck: Normal range of motion. Neck supple.  Cardiovascular: Normal rate and intact distal pulses.   Respiratory: Effort normal and breath sounds normal.  GI: Soft. Bowel sounds are normal.  Genitourinary: Vagina normal and uterus normal.  Ext genitalia WNL.  BUS neg.  Speculum with sm amt of creamy white d/c in vault.  Cx without lesions or discharge but is quite friable and sm amt of bleeding when touched.  Neg CMT.  Ut, soft, mobile, NT and FH 33cms.  SVE Closed/20% effaced/-3 station/posterior  Musculoskeletal: Normal range of motion.  Neurological: She is alert and oriented  to [place], place, and time. She has normal reflexes.  Skin: Skin is warm and dry.  Psychiatric: She has a normal mood and affect. Her behavior is normal.   FHR baseline 130bpm Variability: Moderate Accels: Present Decels: Absent FHR Cat 1 Rare uterine irritability on toco.  No UCs noted.    MAU Course  Procedures Results for orders placed during the hospital encounter of 09/15/12 (from the past 24 hour(s))  URINALYSIS, ROUTINE W REFLEX MICROSCOPIC     Status: Abnormal   Collection Time    09/15/12  8:35 PM      Result Value Range   Color, Urine YELLOW  YELLOW   APPearance HAZY (*) CLEAR   Specific Gravity, Urine >1.030 (*) 1.005 -  1.030   pH 6.0  5.0 - 8.0   Glucose, UA NEGATIVE  NEGATIVE mg/dL   Hgb urine dipstick NEGATIVE  NEGATIVE   Bilirubin Urine NEGATIVE  NEGATIVE   Ketones, ur 15 (*) NEGATIVE mg/dL   Protein, ur NEGATIVE  NEGATIVE mg/dL   Urobilinogen, UA 1.0  0.0 - 1.0 mg/dL   Nitrite NEGATIVE  NEGATIVE   Leukocytes, UA TRACE (*) NEGATIVE  URINE MICROSCOPIC-ADD ON     Status: Abnormal   Collection Time    09/15/12  8:35 PM      Result Value Range   Squamous Epithelial / LPF MANY (*) RARE   WBC, UA 0-2  <3 WBC/hpf   RBC / HPF 0-2  <3 RBC/hpf   Urine-Other CA OXALATE CRYSTALS    WET PREP, GENITAL     Status: Abnormal   Collection Time    09/15/12  9:13 PM      Result Value Range   Yeast Wet Prep HPF POC NONE SEEN  NONE SEEN   Trich, Wet Prep NONE SEEN  NONE SEEN   Clue Cells Wet Prep HPF POC FEW (*) NONE SEEN   WBC, Wet Prep HPF POC FEW (*) NONE SEEN  FETAL FIBRONECTIN     Status: None   Collection Time    09/15/12  9:13 PM      Result Value Range   Fetal Fibronectin NEGATIVE  NEGATIVE    Assessment and Plan  IUP at 32w 6d Abdominal cramping Bacterial vaginosis  Pt reports cramping improved after po fluids.  Results of labs d/w pt.   Will discharge to home. Revd s/s preterm labor. Revd fetal kick counts. Will tx BV with Metronidazole 500mg  x 7 days.  Printed rx given. RTO May 9th as previously scheduled for f/u or prn.    Lennix Kneisel O. 09/15/2012, 10:23 PM

## 2012-09-15 NOTE — MAU Note (Signed)
Cramping all day today. No bleeding or d/c

## 2012-09-17 LAB — GC/CHLAMYDIA PROBE AMP
CT Probe RNA: NEGATIVE
GC Probe RNA: NEGATIVE

## 2012-10-12 LAB — OB RESULTS CONSOLE GBS: GBS: POSITIVE

## 2012-10-23 ENCOUNTER — Inpatient Hospital Stay (HOSPITAL_COMMUNITY)
Admission: AD | Admit: 2012-10-23 | Discharge: 2012-10-23 | Disposition: A | Discharge status: Home / Self Care | Payer: Medicaid Other | Source: Ambulatory Visit | Attending: Obstetrics and Gynecology | Admitting: Obstetrics and Gynecology

## 2012-10-23 DIAGNOSIS — O471 False labor at or after 37 completed weeks of gestation: Secondary | ICD-10-CM

## 2012-10-23 DIAGNOSIS — O479 False labor, unspecified: Secondary | ICD-10-CM | POA: Insufficient documentation

## 2012-10-23 MED ORDER — ZOLPIDEM TARTRATE 5 MG PO TABS
5.0000 mg | ORAL_TABLET | Freq: Once | ORAL | Status: AC
  Administered 2012-10-23: 5 mg via ORAL
  Filled 2012-10-23: qty 1

## 2012-10-23 MED ORDER — ZOLPIDEM TARTRATE 5 MG PO TABS: 5.0000 mg | ORAL_TABLET | Freq: Once | ORAL | Status: DC

## 2012-10-23 NOTE — MAU Provider Note (Signed)
  History   Anne Baxter is a 22 y.o. SBF who presents unannounced at [redacted]w[redacted]d for labor check w/ CC of ctxs for about 1.5 hrs.  No LOF or VB.  GFM.  No UTI or PIH s/s.  "1cm" at last week's exam.  Accompanied by a female visitor.  No resp or GI c/o's.  .. Patient Active Problem List   Diagnosis Date Noted  . Normal pregnancy 07/16/2012  . LGSIL (low grade squamous intraepithelial dysplasia) 06/14/2012    CSN: [id number]  Arrival date and time: 10/23/12 1191   First Provider Initiated Contact with Patient 10/23/12 0159      No chief complaint on file.  HPI  OB History   Grav Para Term Preterm Abortions TAB SAB Ect Mult Living   2    1     0      Past Medical History  Diagnosis Date  . Pregnant   . Infection 02/2017    CHLAMYDIA  . Anemia 2010    FE  . Wrist fracture 2009 X2    No past surgical history on file.  Family History  Problem Relation Age of Onset  . Kidney disease Mother     FAILURE  . Heart disease Mother     CHF  . Early death Mother 71  . Hypertension Mother   . Thrombophlebitis Mother   . Hyperlipidemia Maternal Grandmother   . Hypertension Maternal Grandmother     History  Substance Use Topics  . Smoking status: Never Smoker   . Smokeless tobacco: Never Used  . Alcohol Use: Yes     Comment: RARELY    Allergies: No Known Allergies  Prescriptions prior to admission  Medication Sig Dispense Refill  . Prenatal Vitamins (DIS) TABS Take 1 tablet by mouth every morning.  30 tablet  0  . metroNIDAZOLE (FLAGYL) 500 MG tablet Take 1 tablet (500 mg total) by mouth 2 (two) times daily with a meal.  14 tablet  0    ROS--see HPI Physical Exam   Blood pressure 126/71, pulse 90, resp. rate 16, height 5\' 6"  (1.676 m), weight 149 lb (67.586 kg), last menstrual period 01/29/2012.  Physical Exam  Constitutional: She is oriented to [place], place, and time. She appears well-developed and well-nourished. No distress.  HENT:  Head: Normocephalic and  atraumatic.  Eyes: Pupils are equal, round, and reactive to light.  Cardiovascular: Normal rate.   Respiratory: Effort normal.  GI: Soft.  gravid  Genitourinary:  Cx: 1/70/-2, very, very posterior  Musculoskeletal: She exhibits no edema.  Neurological: She is alert and oriented to [place], place, and time.  Skin: Skin is warm and dry.    MAU Course  Procedures 1. NST: 130, reactive, no decels, moderate variability; TOCO: irregular ctxs, q 8-10 min, mild to mod. 2. Ambien 5mg  po x1 Assessment and Plan  1. [redacted]w[redacted]d 2. No cervical change since last exam at office last week 3. occ'l ctxs 4. MOPS/READY  5. Cat I FHT  1. D/c'd home after given 5mg  po Ambien; Rx also given to pt for #30, no RF 2. F/u at office for ROB 10/25/12, or prn 3. Labor precautions and FKC rev'd  Darcey Cardy H 10/23/2012, 2:05 AM

## 2012-10-30 ENCOUNTER — Inpatient Hospital Stay (HOSPITAL_COMMUNITY): Payer: Medicaid Other | Admitting: Anesthesiology

## 2012-10-30 ENCOUNTER — Encounter (HOSPITAL_COMMUNITY): Payer: Self-pay | Admitting: *Deleted

## 2012-10-30 ENCOUNTER — Encounter (HOSPITAL_COMMUNITY): Payer: Self-pay | Admitting: Anesthesiology

## 2012-10-30 ENCOUNTER — Inpatient Hospital Stay (HOSPITAL_COMMUNITY)
Admission: AD | Admit: 2012-10-30 | Discharge: 2012-11-02 | DRG: 775 | Disposition: A | Discharge status: Home / Self Care | Payer: Medicaid Other | Source: Ambulatory Visit | Attending: Obstetrics and Gynecology | Admitting: Obstetrics and Gynecology

## 2012-10-30 DIAGNOSIS — O99892 Other specified diseases and conditions complicating childbirth: Secondary | ICD-10-CM | POA: Diagnosis present

## 2012-10-30 DIAGNOSIS — Z22330 Carrier of Group B streptococcus: Secondary | ICD-10-CM

## 2012-10-30 DIAGNOSIS — IMO0001 Reserved for inherently not codable concepts without codable children: Secondary | ICD-10-CM

## 2012-10-30 LAB — CBC
HCT: 33.1 % — ABNORMAL LOW (ref 36.0–46.0)
Hemoglobin: 10.7 g/dL — ABNORMAL LOW (ref 12.0–15.0)
MCH: 24.3 pg — ABNORMAL LOW (ref 26.0–34.0)
MCHC: 32.3 g/dL (ref 30.0–36.0)
MCV: 75.1 fL — ABNORMAL LOW (ref 78.0–100.0)
Platelets: 289 10*3/uL (ref 150–400)
RBC: 4.41 MIL/uL (ref 3.87–5.11)
RDW: 14.4 % (ref 11.5–15.5)
WBC: 7.4 10*3/uL (ref 4.0–10.5)

## 2012-10-30 LAB — RPR: RPR Ser Ql: NONREACTIVE

## 2012-10-30 MED ORDER — EPHEDRINE 5 MG/ML INJ
10.0000 mg | INTRAVENOUS | Status: DC | PRN
  Filled 2012-10-30: qty 2
  Filled 2012-10-30: qty 4

## 2012-10-30 MED ORDER — LACTATED RINGERS IV SOLN
500.0000 mL | Freq: Once | INTRAVENOUS | Status: AC
  Administered 2012-10-30: 500 mL via INTRAVENOUS

## 2012-10-30 MED ORDER — LIDOCAINE HCL (PF) 1 % IJ SOLN
30.0000 mL | INTRAMUSCULAR | Status: DC | PRN
  Administered 2012-10-31: 30 mL via SUBCUTANEOUS
  Filled 2012-10-30 (×2): qty 30

## 2012-10-30 MED ORDER — FENTANYL 2.5 MCG/ML BUPIVACAINE 1/10 % EPIDURAL INFUSION (WH - ANES)
14.0000 mL/h | INTRAMUSCULAR | Status: DC | PRN
  Administered 2012-10-30: 14 mL/h via EPIDURAL
  Filled 2012-10-30: qty 125

## 2012-10-30 MED ORDER — IBUPROFEN 600 MG PO TABS
600.0000 mg | ORAL_TABLET | Freq: Four times a day (QID) | ORAL | Status: DC | PRN
  Administered 2012-10-31: 600 mg via ORAL
  Filled 2012-10-30: qty 1

## 2012-10-30 MED ORDER — LACTATED RINGERS IV SOLN
INTRAVENOUS | Status: DC
  Administered 2012-10-30 (×2): via INTRAVENOUS

## 2012-10-30 MED ORDER — BUTORPHANOL TARTRATE 1 MG/ML IJ SOLN
1.0000 mg | INTRAMUSCULAR | Status: DC | PRN
  Administered 2012-10-30: 1 mg via INTRAVENOUS
  Filled 2012-10-30: qty 1

## 2012-10-30 MED ORDER — OXYCODONE-ACETAMINOPHEN 5-325 MG PO TABS: 1.0000 | ORAL_TABLET | ORAL | Status: DC | PRN

## 2012-10-30 MED ORDER — FLEET ENEMA 7-19 GM/118ML RE ENEM: 1.0000 | ENEMA | RECTAL | Status: DC | PRN

## 2012-10-30 MED ORDER — CITRIC ACID-SODIUM CITRATE 334-500 MG/5ML PO SOLN: 30.0000 mL | ORAL | Status: DC | PRN

## 2012-10-30 MED ORDER — EPHEDRINE 5 MG/ML INJ
10.0000 mg | INTRAVENOUS | Status: DC | PRN
  Filled 2012-10-30: qty 2

## 2012-10-30 MED ORDER — OXYTOCIN BOLUS FROM INFUSION
500.0000 mL | INTRAVENOUS | Status: DC
  Administered 2012-10-31: 500 mL via INTRAVENOUS

## 2012-10-30 MED ORDER — PENICILLIN G POTASSIUM 5000000 UNITS IJ SOLR
2.5000 10*6.[IU] | INTRAVENOUS | Status: DC
  Administered 2012-10-30 – 2012-10-31 (×2): 2.5 10*6.[IU] via INTRAVENOUS
  Filled 2012-10-30 (×5): qty 2.5

## 2012-10-30 MED ORDER — PENICILLIN G POTASSIUM 5000000 UNITS IJ SOLR
5.0000 10*6.[IU] | Freq: Once | INTRAVENOUS | Status: AC
  Administered 2012-10-30: 5 10*6.[IU] via INTRAVENOUS
  Filled 2012-10-30: qty 5

## 2012-10-30 MED ORDER — SODIUM BICARBONATE 8.4 % IV SOLN
INTRAVENOUS | Status: DC | PRN
  Administered 2012-10-30: 5 mL via EPIDURAL

## 2012-10-30 MED ORDER — ACETAMINOPHEN 325 MG PO TABS
650.0000 mg | ORAL_TABLET | ORAL | Status: DC | PRN
  Administered 2012-10-30: 650 mg via ORAL
  Filled 2012-10-30: qty 2

## 2012-10-30 MED ORDER — ONDANSETRON HCL 4 MG/2ML IJ SOLN: 4.0000 mg | Freq: Four times a day (QID) | INTRAMUSCULAR | Status: DC | PRN

## 2012-10-30 MED ORDER — LACTATED RINGERS IV SOLN: 500.0000 mL | INTRAVENOUS | Status: DC | PRN

## 2012-10-30 MED ORDER — DIPHENHYDRAMINE HCL 50 MG/ML IJ SOLN: 12.5000 mg | INTRAMUSCULAR | Status: DC | PRN

## 2012-10-30 MED ORDER — PHENYLEPHRINE 40 MCG/ML (10ML) SYRINGE FOR IV PUSH (FOR BLOOD PRESSURE SUPPORT)
80.0000 ug | PREFILLED_SYRINGE | INTRAVENOUS | Status: DC | PRN
  Filled 2012-10-30: qty 5
  Filled 2012-10-30: qty 2

## 2012-10-30 MED ORDER — OXYTOCIN 40 UNITS IN LACTATED RINGERS INFUSION - SIMPLE MED
62.5000 mL/h | INTRAVENOUS | Status: DC
  Filled 2012-10-30: qty 1000

## 2012-10-30 MED ORDER — PHENYLEPHRINE 40 MCG/ML (10ML) SYRINGE FOR IV PUSH (FOR BLOOD PRESSURE SUPPORT)
80.0000 ug | PREFILLED_SYRINGE | INTRAVENOUS | Status: DC | PRN
  Filled 2012-10-30: qty 2

## 2012-10-30 NOTE — Anesthesia Preprocedure Evaluation (Signed)

## 2012-10-30 NOTE — Anesthesia Procedure Notes (Signed)

## 2012-10-30 NOTE — H&P (Signed)
Anne Baxter is a 22 y.o. female presenting for active labor at 39w 2d gestation. History Pt presented to MAU early afternoon with complaints of regular uterine contractions.  Pt ambulated x 1 hr after initial exam and reactive FHR tracing obtained.  SVE changed from 2cm/70% to 3cm/80% after ambulation.  Pt unable to walk or talk through contractions.  OB History   Grav Para Term Preterm Abortions TAB SAB Ect Mult Living   2    1     0    Pt entered care [redacted]wks gestation.  EDC established by ultrasound at [redacted]wks gestation.  Anatomic ultrasound performed at approx 20wks and no anatomic abn were identified.  Pt with neg 1hr GTT.  Most recent ultrasound done due to size/dates discrepancy with EFW at 64th%ile and normal AFI.  GBS positive.  Remainder of pregnancy course unremarkable.    Past Medical History  Diagnosis Date  . Pregnant   . Infection 02/2017    CHLAMYDIA  . Anemia 2010    FE  . Wrist fracture 2009 X2   Past Surgical History  Procedure Laterality Date  . No past surgeries     Family History: family history includes Early death (age of onset: 35) in her mother; Heart disease in her mother; Hyperlipidemia in her maternal grandmother; Hypertension in her maternal grandmother and mother; Kidney disease in her mother; and Thrombophlebitis in her mother. Social History:  reports that she has never smoked. She has never used smokeless tobacco. She reports that  drinks alcohol. She reports that she does not use illicit drugs.   Prenatal Transfer Tool  Maternal Diabetes: No Genetic Screening: Normal Maternal Ultrasounds/Referrals: Normal Fetal Ultrasounds or other Referrals:  None Maternal Substance Abuse:  No Significant Maternal Medications:  None Significant Maternal Lab Results:  None Other Comments:  None  Review of Systems  Constitutional: Negative.   HENT: Negative.   Eyes: Negative.   Respiratory: Negative.   Cardiovascular: Negative.   Gastrointestinal: Negative.    Genitourinary: Negative.   Musculoskeletal: Negative.   Skin: Negative.   Neurological: Negative.   Endo/Heme/Allergies: Negative.   Psychiatric/Behavioral: Negative.     Dilation: 3 Effacement (%): 80 Station: -1 Exam by:: N. Fina Heizer CNM Blood pressure 115/75, pulse 107, temperature 97.5 F (36.4 C), temperature source Oral, resp. rate 18, height 5\' 6"  (1.676 m), weight 68.312 kg (150 lb 9.6 oz), last menstrual period 01/29/2012. Maternal Exam:  Uterine Assessment: Contraction strength is moderate.  Contraction frequency is regular.   Abdomen: Patient reports no abdominal tenderness. Fundal height is 38.   Estimated fetal weight is 7# 0.   Fetal presentation: vertex  Introitus: Normal vulva. Normal vagina.  Ferning test: not done.  Nitrazine test: not done. Amniotic fluid character: not assessed.  Pelvis: adequate for delivery.   Cervix: Cervix evaluated by digital exam.     Fetal Exam Fetal Monitor Review: Mode: ultrasound.   Baseline rate: 140.  Pattern: accelerations present and no decelerations.    Fetal State Assessment: Category I - tracings are normal.     Physical Exam  Constitutional: She is oriented to [place], place, and time. She appears well-developed and well-nourished.  HENT:  Head: Normocephalic and atraumatic.  Right Ear: External ear normal.  Left Ear: External ear normal.  Nose: Nose normal.  Eyes: Conjunctivae are normal. Pupils are equal, round, and reactive to light.  Neck: Normal range of motion. Neck supple.  Cardiovascular: Normal rate, regular rhythm and intact distal pulses.   Respiratory:  Effort normal and breath sounds normal.  GI: Soft. Bowel sounds are normal. She exhibits no distension. There is no rebound.  Genitourinary: Vagina normal and uterus normal.  Musculoskeletal: Normal range of motion.  Neurological: She is alert and oriented to [place], place, and time. She has normal reflexes.  Skin: Skin is warm and dry.  Psychiatric:  She has a normal mood and affect. Her behavior is normal.    Prenatal labs: ABO, Rh: A/POS/-- (12/16 0932) Antibody: NEG (12/16 0932) Rubella: 3.65 (12/16 0932) RPR: NON REAC (12/16 0932)  HBsAg: NEGATIVE (12/16 0932)  HIV: NON REACTIVE (12/16 0932)  GBS:  Positive   Assessment/Plan: IUP at 39w 2d Early labor Positive GBS  Admit to [facility] per consult with Dr. Su Hilt. Routine admission orders. PCN G rx for pos GBS. Pt desires epidural placement when in [facility].  IV pain medication ordered for use in MAU until transfer completed.    Enjoli Tidd O. 10/30/2012, 4:17 PM

## 2012-10-30 NOTE — Progress Notes (Signed)
  Subjective: Comfortable with epidural.  Objective: BP 129/83  Pulse 89  Temp(Src) 97.9 F (36.6 C) (Oral)  Resp 18  Ht 5\' 6"  (1.676 m)  Wt 150 lb (68.04 kg)  BMI 24.22 kg/m2  SpO2 100%  LMP 01/29/2012     FHT:  Category 1 UC:   regular, every 5 minutes SVE:   Cx 3.5, 90%, vtx, -1 AROM--clear fluid.  Assessment / Plan: Progressive labor Will observe progress--augment prn.  Nigel Bridgeman 10/30/2012, 11:12 PM

## 2012-10-30 NOTE — MAU Provider Note (Signed)
History    CSN: [id number]  Arrival date and time: 10/30/12 1409   Chief Complaint  Patient presents with  . Labor Eval   HPI Pt presents unannounced to MAU with c/o uterine contractions which began at approx 0500 10/30/12 with frequency of approx every 3-5 minutes.  She states they decreased in frequency during the midmorning and then have increased in frequency again.  Denies ROM or bldg.  Reports active fetus.  States she is unable to walk and talk through contractions.   OB History   Grav Para Term Preterm Abortions TAB SAB Ect Mult Living   2    1     0      Past Medical History  Diagnosis Date  . Pregnant   . Infection 02/2017    CHLAMYDIA  . Anemia 2010    FE  . Wrist fracture 2009 X2    Past Surgical History  Procedure Laterality Date  . No past surgeries      Family History  Problem Relation Age of Onset  . Kidney disease Mother     FAILURE  . Heart disease Mother     CHF  . Early death Mother 17  . Hypertension Mother   . Thrombophlebitis Mother   . Hyperlipidemia Maternal Grandmother   . Hypertension Maternal Grandmother     History  Substance Use Topics  . Smoking status: Never Smoker   . Smokeless tobacco: Never Used  . Alcohol Use: Yes     Comment: RARELY    Allergies: No Known Allergies  Prescriptions prior to admission  Medication Sig Dispense Refill  . metroNIDAZOLE (FLAGYL) 500 MG tablet Take 1 tablet (500 mg total) by mouth 2 (two) times daily with a meal.  14 tablet  0  . Prenatal Vitamins (DIS) TABS Take 1 tablet by mouth every morning.  30 tablet  0  . zolpidem (AMBIEN) 5 MG tablet Take 1 tablet (5 mg total) by mouth once.  30 tablet  0    Review of Systems  Constitutional: Negative.   HENT: Negative.   Eyes: Negative.   Respiratory: Negative.   Cardiovascular: Negative.   Gastrointestinal: Negative.   Genitourinary: Negative.   Musculoskeletal: Negative.   Skin: Negative.   Neurological: Negative.   Endo/Heme/Allergies:  Negative.   Psychiatric/Behavioral: Negative.    Physical Exam   Blood pressure 115/75, pulse 107, temperature 97.5 F (36.4 C), temperature source Oral, resp. rate 18, height 5\' 6"  (1.676 m), weight 68.312 kg (150 lb 9.6 oz), last menstrual period 01/29/2012.  Physical Exam  Constitutional: She is oriented to [place], place, and time. She appears well-developed and well-nourished.  HENT:  Head: Normocephalic and atraumatic.  Right Ear: External ear normal.  Left Ear: External ear normal.  Nose: Nose normal.  Eyes: Conjunctivae are normal. Pupils are equal, round, and reactive to light.  Neck: Normal range of motion. Neck supple.  Cardiovascular: Normal rate, regular rhythm and intact distal pulses.   Respiratory: Effort normal and breath sounds normal.  GI: Soft. Bowel sounds are normal. There is no tenderness.  Gravid  Genitourinary: Vagina normal and uterus normal.  Speculum exam deferred.  Musculoskeletal: Normal range of motion.  Neurological: She is alert and oriented to [place], place, and time. She has normal reflexes.  Skin: Skin is warm and dry.  Psychiatric: She has a normal mood and affect. Her behavior is normal.   SVE: 2cm/70%/-2/soft/posterior/vtx  FHR baseline: Baseline 130bpm; Variability moderate; Accels present; Decels absent.  FHR  Cat 1 Toco:  UCs every 3-7 mins, mild to mod to palpation.             MAU Course  Procedures  Assessment and Plan  IUP at 39w 2d Ut contractions  Will ambulate x 1 hr and reeval SVE.    Tahtiana Rozier O. 10/30/2012, 3:10 PM   After ambulating for approx 1hr, pt returned to MAU and reported UCs approx every 2-3 mins and much stronger than previously.  Active fetus.  Denies ROM or bldg.    Repeat SVE 3/80/-2/vtx/contd posterior  Will admit to [facility] per consult with Dr. Su Hilt Routine admission orders. Pt plans epidural anesthesia Pos GBS and PCN ordered.

## 2012-10-30 NOTE — MAU Note (Signed)
Patient states she has been having contractions 2-5 minutes since this am. Denies bleeding or leaking and reports good fetal movement.

## 2012-10-30 NOTE — Progress Notes (Signed)
  Subjective: Still sleepy from pain meds, but breathing with contractions.  Planning epidural soon.  Objective: BP 130/72  Pulse 93  Temp(Src) 97.9 F (36.6 C) (Oral)  Resp 18  Ht 5\' 6"  (1.676 m)  Wt 150 lb (68.04 kg)  BMI 24.22 kg/m2  LMP 01/29/2012      FHT:  Category 1 UC:   irregular, every 4-6 minutes SVE:   Deferred at present  Assessment / Plan: Early labor Will place epidural and recheck cervix then  Nigel Bridgeman 10/30/2012, 8:58 PM

## 2012-10-31 ENCOUNTER — Encounter (HOSPITAL_COMMUNITY): Payer: Self-pay | Admitting: *Deleted

## 2012-10-31 MED ORDER — ONDANSETRON HCL 4 MG PO TABS: 4.0000 mg | ORAL_TABLET | ORAL | Status: DC | PRN

## 2012-10-31 MED ORDER — PRENATAL MULTIVITAMIN CH
1.0000 | ORAL_TABLET | Freq: Every day | ORAL | Status: DC
  Administered 2012-10-31 – 2012-11-02 (×3): 1 via ORAL
  Filled 2012-10-31 (×4): qty 1

## 2012-10-31 MED ORDER — TETANUS-DIPHTH-ACELL PERTUSSIS 5-2.5-18.5 LF-MCG/0.5 IM SUSP
0.5000 mL | Freq: Once | INTRAMUSCULAR | Status: AC
  Administered 2012-11-02: 0.5 mL via INTRAMUSCULAR

## 2012-10-31 MED ORDER — SIMETHICONE 80 MG PO CHEW: 80.0000 mg | CHEWABLE_TABLET | ORAL | Status: DC | PRN

## 2012-10-31 MED ORDER — ZOLPIDEM TARTRATE 5 MG PO TABS: 5.0000 mg | ORAL_TABLET | Freq: Every evening | ORAL | Status: DC | PRN

## 2012-10-31 MED ORDER — ONDANSETRON HCL 4 MG/2ML IJ SOLN: 4.0000 mg | INTRAMUSCULAR | Status: DC | PRN

## 2012-10-31 MED ORDER — DIBUCAINE 1 % RE OINT: 1.0000 "application " | TOPICAL_OINTMENT | RECTAL | Status: DC | PRN

## 2012-10-31 MED ORDER — WITCH HAZEL-GLYCERIN EX PADS: 1.0000 "application " | MEDICATED_PAD | CUTANEOUS | Status: DC | PRN

## 2012-10-31 MED ORDER — OXYCODONE-ACETAMINOPHEN 5-325 MG PO TABS
1.0000 | ORAL_TABLET | ORAL | Status: DC | PRN
  Administered 2012-11-01: 1 via ORAL
  Filled 2012-10-31: qty 1

## 2012-10-31 MED ORDER — DIPHENHYDRAMINE HCL 25 MG PO CAPS: 25.0000 mg | ORAL_CAPSULE | Freq: Four times a day (QID) | ORAL | Status: DC | PRN

## 2012-10-31 MED ORDER — BENZOCAINE-MENTHOL 20-0.5 % EX AERO
1.0000 "application " | INHALATION_SPRAY | CUTANEOUS | Status: DC | PRN
  Administered 2012-10-31: 1 via TOPICAL
  Filled 2012-10-31: qty 56

## 2012-10-31 MED ORDER — IBUPROFEN 600 MG PO TABS
600.0000 mg | ORAL_TABLET | Freq: Four times a day (QID) | ORAL | Status: DC
  Administered 2012-10-31 – 2012-11-02 (×9): 600 mg via ORAL
  Filled 2012-10-31 (×10): qty 1

## 2012-10-31 MED ORDER — SENNOSIDES-DOCUSATE SODIUM 8.6-50 MG PO TABS
2.0000 | ORAL_TABLET | Freq: Every day | ORAL | Status: DC
  Administered 2012-11-01 (×2): 2 via ORAL

## 2012-10-31 MED ORDER — LANOLIN HYDROUS EX OINT: TOPICAL_OINTMENT | CUTANEOUS | Status: DC | PRN

## 2012-10-31 NOTE — Progress Notes (Signed)
  Subjective: Comfortable with epidural.  Objective: BP 127/75  Pulse 80  Temp(Src) 98.5 F (36.9 C) (Oral)  Resp 18  Ht 5\' 6"  (1.676 m)  Wt 150 lb (68.04 kg)  BMI 24.22 kg/m2  SpO2 100%  LMP 01/29/2012   Total I/O In: -  Out: 375 [Urine:375]  FHT:  Category 1 UC:   regular, every 3-4 minutes SVE:    7 cm, 100%, vtx, +1  Assessment / Plan: Progressive labor Will CTO.  Maude Gloor 10/31/2012, 0130

## 2012-10-31 NOTE — Progress Notes (Signed)
UR chart review completed.  

## 2012-10-31 NOTE — Anesthesia Postprocedure Evaluation (Signed)
  Anesthesia Post-op Note  Patient: Anne Baxter  Procedure(s) Performed: * No procedures listed *  Patient Location: Mother/Baby  Anesthesia Type:Epidural  Level of Consciousness: awake  Airway and Oxygen Therapy: Patient Spontanous Breathing  Post-op Pain: mild  Post-op Assessment: Patient's Cardiovascular Status Stable and Respiratory Function Stable  Post-op Vital Signs: stable  Complications: No apparent anesthesia complications

## 2012-10-31 NOTE — Progress Notes (Signed)
  Subjective: Now with urge to push.  Objective: BP 127/75  Pulse 80  Temp(Src) 98.8 F (37.1 C) (Oral)  Resp 18  Ht 5\' 6"  (1.676 m)  Wt 150 lb (68.04 kg)  BMI 24.22 kg/m2  SpO2 100%  LMP 01/29/2012   Total I/O In: -  Out: 375 [Urine:375]  FHT:  Reassuring, no decels UC:   regular, every 3-4 minutes SVE:   Dilation: 10 Effacement (%): 100 Station: +1 Exam by:: Emilee Hero CNM  Assessment / Plan: 2nd stage labor Will initiate active pushing now that patient has urge. Dr. Dion Body updated.  Anne Baxter 10/31/2012, 4:23 AM

## 2012-11-01 LAB — CBC
HCT: 27.7 % — ABNORMAL LOW (ref 36.0–46.0)
Hemoglobin: 9.2 g/dL — ABNORMAL LOW (ref 12.0–15.0)
MCH: 24.5 pg — ABNORMAL LOW (ref 26.0–34.0)
MCHC: 33.2 g/dL (ref 30.0–36.0)
MCV: 73.7 fL — ABNORMAL LOW (ref 78.0–100.0)
Platelets: 249 10*3/uL (ref 150–400)
RBC: 3.76 MIL/uL — ABNORMAL LOW (ref 3.87–5.11)
RDW: 14 % (ref 11.5–15.5)
WBC: 10.8 10*3/uL — ABNORMAL HIGH (ref 4.0–10.5)

## 2012-11-01 NOTE — Lactation Note (Signed)
This note was copied from the chart of Girl Zachary Tompson. Lactation Consultation Note  Patient Name: Girl Mackena Minney Today's Date: 11/01/2012 Reason for consult: Follow-up assessment   Maternal Data Formula Feeding for Exclusion: No  Feeding   LATCH Score/Interventions Latch: Grasps breast easily, tongue down, lips flanged, rhythmical sucking.  Audible Swallowing: A few with stimulation  Type of Nipple: Everted at rest and after stimulation  Comfort (Breast/Nipple): Soft / non-tender     Hold (Positioning): No assistance needed to correctly position infant at breast. Intervention(s): Breastfeeding basics reviewed;Support Pillows  LATCH Score: 9  Lactation Tools Discussed/Used     Consult Status Consult Status: Follow-up Date: 11/02/12 Follow-up type: In-patient  Mom reports that BF is getting easier. Reports that she almost gave formula yesterday but baby has been doing better today. Baby awake and sucking on pacifier. Encouraged to put baby to the breast. Mom reports that she just fed for 15 minutes. Baby latches easily- opens wide and deep onto the breast. Encouragement given. No questions at present. To call prn   Pamelia Hoit 11/01/2012, 1:05 PM

## 2012-11-01 NOTE — Progress Notes (Signed)
Patient ID: Anne Baxter, female   DOB: 1991/01/01, 22 y.o.   MRN: [id number] Post Partum Day 1  Subjective: no complaints, up ad lib without syncope, voiding, tolerating PO,  Pain well controlled with po meds,  BF started Mood stable, bonding well Contraception: undecided    Objective: Blood pressure 121/77, pulse 69, temperature 98.5 F (36.9 C), temperature source Oral, resp. rate 17, height 5\' 6"  (1.676 m), weight 150 lb (68.04 kg), last menstrual period 01/29/2012, SpO2 100.00%, unknown if currently breastfeeding.  Physical Exam:  General: NAD  Lochia: appropriate Uterine Fundus: firm Perineum: healing  DVT Evaluation: No evidence of DVT seen on physical exam. Negative Homan's sign. No significant calf/ankle edema.   Recent Labs  10/30/12 1725 11/01/12 0610  HGB 10.7* 9.2*  HCT 33.1* 27.7*    Assessment/Plan: Plan for discharge tomorrow, Breastfeeding, Lactation consult and Contraception to be discussed        LOS: 2 days   Anne Baxter M 11/01/2012, 11:28 AM

## 2012-11-02 MED ORDER — IBUPROFEN 600 MG PO TABS: 600.0000 mg | ORAL_TABLET | Freq: Four times a day (QID) | ORAL | Status: DC

## 2012-11-02 MED ORDER — MEDROXYPROGESTERONE ACETATE 150 MG/ML IM SUSP
150.0000 mg | Freq: Once | INTRAMUSCULAR | Status: AC
  Administered 2012-11-02: 150 mg via INTRAMUSCULAR
  Filled 2012-11-02: qty 1

## 2012-11-02 MED ORDER — OXYCODONE-ACETAMINOPHEN 5-325 MG PO TABS: 1.0000 | ORAL_TABLET | ORAL | Status: DC | PRN

## 2012-11-02 MED ORDER — FERROUS SULFATE 325 (65 FE) MG PO TABS: 325.0000 mg | ORAL_TABLET | Freq: Every day | ORAL | Status: DC

## 2012-11-02 NOTE — Discharge Summary (Signed)
Obstetric Discharge Summary Reason for Admission: onset of labor Prenatal Procedures: ultrasound Intrapartum Procedures: spontaneous vaginal delivery Postpartum Procedures: none Complications-Operative and Postpartum: periurethral laceration Hemoglobin  Date Value Range Status  11/01/2012 9.2* 12.0 - 15.0 g/dL Final     HCT  Date Value Range Status  11/01/2012 27.7* 36.0 - 46.0 % Final    Physical Exam:  General: alert, cooperative and no distress Lochia: appropriate Uterine Fundus: firm Incision: healing well DVT Evaluation: No evidence of DVT seen on physical exam. Negative Homan's sign. No significant calf/ankle edema.  Discharge Diagnoses: Term Pregnancy-delivered        Asymptomatic anemia  Discharge Information: Date: 11/02/2012 Activity: unrestricted Diet: routine Medications: PNV, Ibuprofen, Iron and Percocet Condition: stable Instructions: refer to practice specific booklet Discharge to: home Follow-up Information   Follow up with [facility] In 6 weeks.   Contact information:   [address] [place] [address]     Contraception:  Undecided.  Newborn Data: Live born female  Birth Weight: 6 lb 6.1 oz (2895 g) APGAR: 8, 9  Home with mother.  Anne Baxter O. 11/02/2012, 1:18 PM

## 2012-11-02 NOTE — Discharge Instructions (Signed)
See practice brochure °

## 2012-11-26 NOTE — Progress Notes (Signed)
Post Partum Day 2 Subjective: Feels well.  Ambulating, voiding and tol po liquids and solids without difficulty.  Denies weakness or dizziness.    Objective: Blood pressure 116/52, pulse 76, temperature 98.5 F (36.9 C), temperature source Oral, resp. rate 18, height 5\' 6"  (1.676 m), weight 68.04 kg (150 lb), last menstrual period 01/29/2012, SpO2 100.00%, unknown if currently breastfeeding.  Physical Exam:  General: alert, cooperative and no distress Lochia: appropriate Uterine Fundus: firm Incision: healing well DVT Evaluation: No evidence of DVT seen on physical exam. Negative Homan's sign. No significant edema  Assessment/Plan: Stable s/p vaginal delivery Asymptomatic anemia  Discharge instructions reviewed. RTO 6wks for f/u Rx Motrin, Percocet and Ferrous Sulfate Contraception:  Undecided.    LOS: 3 days   Maxten Shuler O. 11/26/2012, 8:01 PM

## 2014-03-24 ENCOUNTER — Encounter (HOSPITAL_COMMUNITY): Payer: Self-pay | Admitting: *Deleted

## 2014-08-13 ENCOUNTER — Encounter (HOSPITAL_COMMUNITY): Payer: Self-pay | Admitting: Emergency Medicine

## 2014-08-13 ENCOUNTER — Emergency Department (HOSPITAL_COMMUNITY)
Admission: EM | Admit: 2014-08-13 | Discharge: 2014-08-13 | Disposition: A | Discharge status: Home / Self Care | Payer: BLUE CROSS/BLUE SHIELD | Attending: Emergency Medicine | Admitting: Emergency Medicine

## 2014-08-13 DIAGNOSIS — Z8619 Personal history of other infectious and parasitic diseases: Secondary | ICD-10-CM | POA: Diagnosis not present

## 2014-08-13 DIAGNOSIS — Z8781 Personal history of (healed) traumatic fracture: Secondary | ICD-10-CM | POA: Insufficient documentation

## 2014-08-13 DIAGNOSIS — H6092 Unspecified otitis externa, left ear: Secondary | ICD-10-CM | POA: Diagnosis not present

## 2014-08-13 DIAGNOSIS — Z79899 Other long term (current) drug therapy: Secondary | ICD-10-CM | POA: Diagnosis not present

## 2014-08-13 DIAGNOSIS — J339 Nasal polyp, unspecified: Secondary | ICD-10-CM | POA: Insufficient documentation

## 2014-08-13 DIAGNOSIS — D649 Anemia, unspecified: Secondary | ICD-10-CM | POA: Diagnosis not present

## 2014-08-13 DIAGNOSIS — J330 Polyp of nasal cavity: Secondary | ICD-10-CM

## 2014-08-13 DIAGNOSIS — H9202 Otalgia, left ear: Secondary | ICD-10-CM | POA: Diagnosis present

## 2014-08-13 MED ORDER — NEOMYCIN-POLYMYXIN-HC 3.5-10000-1 OT SUSP: 4.0000 [drp] | Freq: Four times a day (QID) | OTIC | Status: DC

## 2014-08-13 MED ORDER — ANTIPYRINE-BENZOCAINE 5.4-1.4 % OT SOLN: 3.0000 [drp] | OTIC | Status: DC | PRN

## 2014-08-13 MED ORDER — ACETAMINOPHEN 325 MG PO TABS
650.0000 mg | ORAL_TABLET | Freq: Once | ORAL | Status: AC
  Administered 2014-08-13: 650 mg via ORAL
  Filled 2014-08-13: qty 2

## 2014-08-13 NOTE — ED Notes (Signed)
Pt c/o left ear pain over the past couple of days. Pt states that when she opens her mouth her left ear stings.  Pt states that she will clean her ear out with a towel and then she wil constantly continue to have wax.

## 2014-08-13 NOTE — Discharge Instructions (Signed)
,Read the information below.  Use the prescribed medication as directed.  Please discuss all new medications with your pharmacist.  You may return to the Emergency Department at any time for worsening condition or any new symptoms that concern you.  If you develop fevers, uncontrolled ear pain, bleeding or discharge from your ear, see your doctor or return for a recheck.     Otitis Externa Otitis externa is a bacterial or fungal infection of the outer ear canal. This is the area from the eardrum to the outside of the ear. Otitis externa is sometimes called "swimmer's ear." CAUSES  Possible causes of infection include:  Swimming in dirty water.  Moisture remaining in the ear after swimming or bathing.  Mild injury (trauma) to the ear.  Objects stuck in the ear (foreign body).  Cuts or scrapes (abrasions) on the outside of the ear. SIGNS AND SYMPTOMS  The first symptom of infection is often itching in the ear canal. Later signs and symptoms may include swelling and redness of the ear canal, ear pain, and yellowish-white fluid (pus) coming from the ear. The ear pain may be worse when pulling on the earlobe. DIAGNOSIS  Your health care provider will perform a physical exam. A sample of fluid may be taken from the ear and examined for bacteria or fungi. TREATMENT  Antibiotic ear drops are often given for 10 to 14 days. Treatment may also include pain medicine or corticosteroids to reduce itching and swelling. HOME CARE INSTRUCTIONS   Apply antibiotic ear drops to the ear canal as prescribed by your health care provider.  Take medicines only as directed by your health care provider.  If you have diabetes, follow any additional treatment instructions from your health care provider.  Keep all follow-up visits as directed by your health care provider. PREVENTION   Keep your ear dry. Use the corner of a towel to absorb water out of the ear canal after swimming or bathing.  Avoid scratching  or putting objects inside your ear. This can damage the ear canal or remove the protective wax that lines the canal. This makes it easier for bacteria and fungi to grow.  Avoid swimming in lakes, polluted water, or poorly chlorinated pools.  You may use ear drops made of rubbing alcohol and vinegar after swimming. Combine equal parts of white vinegar and alcohol in a bottle. Put 3 or 4 drops into each ear after swimming. SEEK MEDICAL CARE IF:   You have a fever.  Your ear is still red, swollen, painful, or draining pus after 3 days.  Your redness, swelling, or pain gets worse.  You have a severe headache.  You have redness, swelling, pain, or tenderness in the area behind your ear. MAKE SURE YOU:   Understand these instructions.  Will watch your condition.  Will get help right away if you are not doing well or get worse. Document Released: 05/09/2005 Document Revised: 09/23/2013 Document Reviewed: 05/26/2011 [facility] Patient Information 2015 [place], [place]. This information is not intended to replace advice given to you by your health care provider. Make sure you discuss any questions you have with your health care provider.   Emergency Department Resource Guide 1) Find a Doctor and Pay Out of Pocket Although you won't have to find out who is covered by your insurance plan, it is a good idea to ask around and get recommendations. You will then need to call the office and see if the doctor you have chosen will accept you  as a new patient and what types of options they offer for patients who are self-pay. Some doctors offer discounts or will set up payment plans for their patients who do not have insurance, but you will need to ask so you aren't surprised when you get to your appointment.  2) Contact Your Local Health Department Not all health departments have doctors that can see patients for sick visits, but many do, so it is worth a call to see if yours does. If you don't know where  your local health department is, you can check in your phone book. The CDC also has a tool to help you locate your state's health department, and many state websites also have listings of all of their local health departments.  3) Find a Walk-in Clinic If your illness is not likely to be very severe or complicated, you may want to try a walk in clinic. These are popping up all over the country in pharmacies, drugstores, and shopping centers. They're usually staffed by nurse practitioners or physician assistants that have been trained to treat common illnesses and complaints. They're usually fairly quick and inexpensive. However, if you have serious medical issues or chronic medical problems, these are probably not your best option.  No Primary Care Doctor: - Call [facility] Connect at  [phone] - they can help you locate a primary care doctor that  accepts your insurance, provides certain services, etc. - Physician Referral Service- [phone]  Chronic Pain Problems: Organization         Address  Phone   Notes  [facility]  [phone] Patients need to be referred by their primary care doctor.   Medication Assistance: Organization         Address  Phone   Notes  [facility] Medication [facility] [address] [place], [place] [address] [phone] --Must be a resident of [facility] -- Must have NO insurance coverage whatsoever (no Medicaid/ Medicare, etc.) -- The pt. MUST have a primary care doctor that directs their care regularly and follows them in the community   [facility]  [phone]   [employer]  [phone]    Agencies that provide inexpensive medical care: Organization         Address  Phone   Notes  [facility]  [phone]   [facility]    [phone]   [facility] [address] [place], [place] [address] [phone]   [facility] [address]  [place] [place] [phone]   [facility]    [phone]   [facility]    [phone]   [facility]  [address], [place] Phone:  [phone], Fax:  [phone] Hours of Operation:  9 am - 6 pm, M-F.  Also accepts Medicaid/Medicare and self-pay.  [facility]  [address], [place] Phone: [phone], Fax: [phone]. Hours of Operation:  8:30 am - 5:30 pm, M-F.  Also accepts Medicaid and self-pay.  [facility] [address], [place] Phone: [phone]   [facility] [address] Natasha Bence [place], [place] [phone], Ext. [phone] Mondays & Thursdays: 7-9 AM.  First 15 patients are seen on a first come, first serve basis.

## 2014-08-13 NOTE — ED Provider Notes (Signed)
CSN: [id number]     Arrival date & time 08/13/14  1111 History   First MD Initiated Contact with Patient 08/13/14 1130     Chief Complaint  Patient presents with  . Otalgia    left     (Consider location/radiation/quality/duration/timing/severity/associated sxs/prior Treatment) HPI   Patient reports left ear discomfort that began this morning, approximately 3 hours ago.   States it is throbbing and feels full, hearing is not as clear as she thinks it should be, worse with swallowing.  Denies fevers, nasal congestion, rhinorrhea, sinus pressure, sore throat, difficulty swallowing or breathing.  Denies ear trauma, ear discharge.  Has cleaned it out with a towel but feels like it has filled with wax again.   Past Medical History  Diagnosis Date  . Pregnant   . Infection 02/2017    CHLAMYDIA  . Anemia 2010    FE  . Wrist fracture 2009 X2   Past Surgical History  Procedure Laterality Date  . No past surgeries     Family History  Problem Relation Age of Onset  . Kidney disease Mother     FAILURE  . Heart disease Mother     CHF  . Early death Mother 68  . Hypertension Mother   . Thrombophlebitis Mother   . Hyperlipidemia Maternal Grandmother   . Hypertension Maternal Grandmother    History  Substance Use Topics  . Smoking status: Never Smoker   . Smokeless tobacco: Never Used  . Alcohol Use: Yes     Comment: RARELY   OB History    Gravida Para Term Preterm AB TAB SAB Ectopic Multiple Living   Review of Systems  Constitutional: Negative for fever and chills.  HENT: Positive for ear pain. Negative for ear discharge, facial swelling, rhinorrhea, sinus pressure, sore throat and trouble swallowing.   Respiratory: Negative for cough and shortness of breath.   Musculoskeletal: Negative for neck pain and neck stiffness.  Skin: Negative for color change and wound.  Allergic/Immunologic: Negative for immunocompromised state.  Neurological: Positive for  headaches. Negative for syncope and weakness.  Psychiatric/Behavioral: Negative for self-injury.      Allergies  Review of patient's allergies indicates no known allergies.  Home Medications   Prior to Admission medications   Medication Sig Start Date End Date Taking? Authorizing Provider  ferrous sulfate 325 (65 FE) MG tablet Take 1 tablet (325 mg total) by mouth daily with breakfast. 11/02/12   Elsie Ra, CNM  ibuprofen (ADVIL,MOTRIN) 600 MG tablet Take 1 tablet (600 mg total) by mouth every 6 (six) hours. 11/02/12   Elsie Ra, CNM  oxyCODONE-acetaminophen (PERCOCET/ROXICET) 5-325 MG per tablet Take 1-2 tablets by mouth every 4 (four) hours as needed. 11/02/12   Elsie Ra, CNM  Prenatal Vitamins (DIS) TABS Take 1 tablet by mouth every morning. 03/09/12   Doug Sou, MD   BP 126/76 mmHg  Pulse 106  Temp(Src) 98.3 F (36.8 C) (Oral)  Resp 16  Ht  (1.676 m)  Wt 125 lb (56.7 kg)  BMI 20.19 kg/m2  SpO2 100% Physical Exam  Constitutional: She appears well-developed and well-nourished. No distress.  HENT:  Head: Normocephalic and atraumatic.  Right Ear: Tympanic membrane and ear canal normal.  Left Ear: Tympanic membrane normal. There is tenderness.  Nose: Mucosal edema present. No rhinorrhea, nose lacerations or sinus tenderness.  No foreign bodies.  Mouth/Throat: Oropharynx is clear and moist. No  oropharyngeal exudate.  Left nare with nasal polyp.  Left ear canal with erythema, tenderness on exam, tenderness with pressure on tragus.  No discharge.    Eyes: Conjunctivae are normal.  Neck: Normal range of motion. Neck supple.  Pulmonary/Chest: Effort normal.  Neurological: She is alert.  Skin: She is not diaphoretic.  Nursing note and vitals reviewed.   ED Course  Procedures (including critical care time) Labs Review Labs Reviewed - No data to display  Imaging Review No results found.   EKG Interpretation None      MDM   Final diagnoses:  Otitis  externa, left  Nasal polyp - anterior    Afebrile, nontoxic patient with left ear pain x 3 hours.  Clinically c/w otitis externa.  Incidental nasal polyp found on exam.   D/C home with cortisporin otic, auralgan.  PCP follow up.   Discussed result, findings, treatment, and follow up  with patient.  Pt given return precautions.  Pt verbalizes understanding and agrees with plan.        Trixie Dredgemily Carmyn Hamm, PA-C 08/13/14 1201  Purvis SheffieldForrest Harrison, MD 08/13/14 2214

## 2015-02-04 ENCOUNTER — Ambulatory Visit (INDEPENDENT_AMBULATORY_CARE_PROVIDER_SITE_OTHER): Payer: BLUE CROSS/BLUE SHIELD | Admitting: Neurology

## 2015-02-04 ENCOUNTER — Encounter: Payer: Self-pay | Admitting: Neurology

## 2015-02-04 VITALS — BP 118/81 | HR 100 | Ht 66.0 in | Wt 118.0 lb

## 2015-02-04 DIAGNOSIS — R55 Syncope and collapse: Secondary | ICD-10-CM

## 2015-02-04 DIAGNOSIS — G43009 Migraine without aura, not intractable, without status migrainosus: Secondary | ICD-10-CM

## 2015-02-04 DIAGNOSIS — IMO0002 Reserved for concepts with insufficient information to code with codable children: Secondary | ICD-10-CM

## 2015-02-04 MED ORDER — SUMATRIPTAN SUCCINATE 50 MG PO TABS: 50.0000 mg | ORAL_TABLET | ORAL | Status: DC | PRN

## 2015-02-04 NOTE — Patient Instructions (Signed)
No driving until episode free for 6 months, last episode was January 18 2015

## 2015-02-04 NOTE — Progress Notes (Signed)
PATIENT: Anne Baxter DOB: [birth year]  Chief Complaint  Patient presents with  . Passing Out    She has been experiencing occasional "passing out" spells.  Says she tends have stomach pain, light-headedness, and hyperventilation prior to passing out.  She never remembers passing out but it has been reported to her.  She had some of these events in high school and they resolved without explanation.  She started having them again after her daughter was born in June 2014. She has never had any testing.  She was instructed to stop her Depo-Provera for now.  Her last injection was August 2016.  Marland Kitchen Headache    Reports getting at least one severe headache per week.     HISTORICAL  Karson Chicas Beverley is a 24 years old right-handed female, seen in refer by her primary care Cyndia Bent for evaluation of passing out episode and headaches  She reported a history of passing out since high school, initial spell was contributed to low blood pressure also was related to her heavy menstrual bleeding Her passing out episode has changed since 2014, her childbirth, she seems to have increased episode over the past 2 years, had 2 episodes since August 2016, most recent one was January 18 2015, it was Sunday morning, she was relaxing watching TV, suddenly felt stomach ache, urge to have a bowel movement, she was able to use bathroom, at the same time, she felt heart racing fast, sweaty, nauseous, fainting, she was able to was for help, was able to lie down in her bed, partial loss of consciousness, she was able to hear her family talking, but was not able to respond episode last about 20 to 30 minutes, gradually recovered, there was no witnessed seizure-like activity.  She had a stressful couple years, complains of increased headaches since 2014, over the past few months, she she has headaches once or twice each week, lateralized severe pounding headaches, with associated light noise sensitivity, nauseous, lasting  for couple hours, relieved by sleep, worsening by movement.   REVIEW OF SYSTEMS: Full 14 system review of systems performed and notable only for headaches, dizziness, passing out, sleepiness, depression, decreased energy  ALLERGIES: No Known Allergies  HOME MEDICATIONS: No current outpatient prescriptions on file.   No current facility-administered medications for this visit.    PAST MEDICAL HISTORY: Past Medical History  Diagnosis Date  . Pregnant   . Infection 02/2017    CHLAMYDIA  . Anemia 2010    FE  . Wrist fracture 2009 X2  . Syncope     PAST SURGICAL HISTORY: Past Surgical History  Procedure Laterality Date  . No past surgeries      FAMILY HISTORY: Family History  Problem Relation Age of Onset  . Kidney disease Mother     FAILURE  . Heart disease Mother     CHF  . Early death Mother 75  . Hypertension Mother   . Thrombophlebitis Mother   . Hyperlipidemia Maternal Grandmother   . Hypertension Maternal Grandmother     SOCIAL HISTORY:  Social History   Social History  . Marital Status: Single    Spouse Name: N/A  . Number of Children: 1  . Years of Education: 15   Occupational History  . CUSTOMER SERVICE    Social History Main Topics  . Smoking status: Never Smoker   . Smokeless tobacco: Never Used  . Alcohol Use: No     Comment: RARELY  . Drug Use: No  .  Sexual Activity:    Partners: Male    Pharmacist, hospital Protection: None   Other Topics Concern  . Not on file   Social History Narrative   Lives at home with boyfriend and daughter.   Right-handed.   No caffeine use.     PHYSICAL EXAM   Filed Vitals:   02/04/15 1529  BP: 118/81  Pulse: 100  Height:  (1.676 m)  Weight: 118 lb (53.524 kg)    Not recorded      Body mass index is 19.05 kg/(m^2).  PHYSICAL EXAMNIATION:  Gen: NAD, conversant, well nourised, obese, well groomed                     Cardiovascular: Regular rate rhythm, no peripheral edema, warm,  nontender. Eyes: Conjunctivae clear without exudates or hemorrhage Neck: Supple, no carotid bruise. Pulmonary: Clear to auscultation bilaterally   NEUROLOGICAL EXAM:  MENTAL STATUS: Speech:    Speech is normal; fluent and spontaneous with normal comprehension.  Cognition:     Orientation to time, place and [place]     Normal recent and remote memory     Normal Attention span and concentration     Normal Language, naming, repeating,spontaneous speech     Fund of knowledge   CRANIAL NERVES: CN II: Visual fields are full to confrontation. Fundoscopic exam is normal with sharp discs and no vascular changes. Pupils are round equal and briskly reactive to light. CN III, IV, VI: extraocular movement are normal. No ptosis. CN V: Facial sensation is intact to pinprick in all 3 divisions bilaterally. Corneal responses are intact.  CN VII: Face is symmetric with normal eye closure and smile. CN VIII: Hearing is normal to rubbing fingers CN IX, X: Palate elevates symmetrically. Phonation is normal. CN XI: Head turning and shoulder shrug are intact CN XII: Tongue is midline with normal movements and no atrophy.  MOTOR: There is no pronator drift of out-stretched arms. Muscle bulk and tone are normal. Muscle strength is normal.  REFLEXES: Reflexes are 2+ and symmetric at the biceps, triceps, knees, and ankles. Plantar responses are flexor.  SENSORY: Intact to light touch, pinprick, position sense, and vibration sense are intact in fingers and toes.  COORDINATION: Rapid alternating movements and fine finger movements are intact. There is no dysmetria on finger-to-nose and heel-knee-shin.    GAIT/STANCE: Posture is normal. Gait is steady with normal steps, base, arm swing, and turning. Heel and toe walking are normal. Tandem gait is normal.  Romberg is absent.   DIAGNOSTIC DATA (LABS, IMAGING, TESTING) - I reviewed patient records, labs, notes, testing and imaging myself where  available.   ASSESSMENT AND PLAN  Rashawna L Heuer is a 24 y.o. female    Passout episodes  Differentiation diagnosis includes partial seizure, versus syncope  Proceed with MRI of the brain with and without contrast,  EEG  May consider cardiac monitoring Chronic migraine  Imitrex as needed    Levert Feinstein, M.D. Ph.D.  [facility] [address] [place], [place] [address] Ph: [phone] Fax: [phone]  CC: Elane Fritz, FNP

## 2015-02-27 ENCOUNTER — Other Ambulatory Visit: Payer: BLUE CROSS/BLUE SHIELD

## 2015-03-03 ENCOUNTER — Encounter: Payer: Self-pay | Admitting: Neurology

## 2015-04-27 ENCOUNTER — Encounter: Payer: Self-pay | Admitting: Neurology

## 2015-05-06 ENCOUNTER — Ambulatory Visit: Payer: BLUE CROSS/BLUE SHIELD | Admitting: Neurology

## 2015-11-25 ENCOUNTER — Emergency Department (HOSPITAL_COMMUNITY)
Admission: EM | Admit: 2015-11-25 | Discharge: 2015-11-25 | Disposition: A | Discharge status: Home / Self Care | Payer: Medicaid Other | Attending: Emergency Medicine | Admitting: Emergency Medicine

## 2015-11-25 ENCOUNTER — Encounter (HOSPITAL_COMMUNITY): Payer: Self-pay | Admitting: Emergency Medicine

## 2015-11-25 DIAGNOSIS — J069 Acute upper respiratory infection, unspecified: Secondary | ICD-10-CM

## 2015-11-25 DIAGNOSIS — J029 Acute pharyngitis, unspecified: Secondary | ICD-10-CM | POA: Diagnosis present

## 2015-11-25 DIAGNOSIS — Z3A01 Less than 8 weeks gestation of pregnancy: Secondary | ICD-10-CM | POA: Insufficient documentation

## 2015-11-25 DIAGNOSIS — O99511 Diseases of the respiratory system complicating pregnancy, first trimester: Secondary | ICD-10-CM | POA: Insufficient documentation

## 2015-11-25 DIAGNOSIS — J0100 Acute maxillary sinusitis, unspecified: Secondary | ICD-10-CM | POA: Insufficient documentation

## 2015-11-25 LAB — RAPID STREP SCREEN (MED CTR MEBANE ONLY): Streptococcus, Group A Screen (Direct): NEGATIVE

## 2015-11-25 NOTE — Discharge Instructions (Signed)
Please read and follow all provided instructions.  Your diagnoses today include:  1. Acute maxillary sinusitis, recurrence not specified   2. Upper respiratory infection     You appear to have an upper respiratory infection (URI). An upper respiratory tract infection, or cold, is a viral infection of the air passages leading to the lungs. It should improve gradually after 5-7 days. You may have a lingering cough that lasts for 2- 4 weeks after the infection.  Tests performed today include:  Vital signs. See below for your results today.   Medications prescribed:   Tylenol   Sinus irrigation - there are several types sold at any pharmacy  Take any prescribed medications only as directed. Treatment for your infection is aimed at treating the symptoms. There are no medications, such as antibiotics, that will cure your infection.   Home care instructions:  Follow any educational materials contained in this packet.   Your illness is contagious and can be spread to others, especially during the first 3 or 4 days. It cannot be cured by antibiotics or other medicines. Take basic precautions such as washing your hands often, covering your mouth when you cough or sneeze, and avoiding public places where you could spread your illness to others.   Please continue drinking plenty of fluids.  Use over-the-counter medicines as needed as directed on packaging for symptom relief.  You may also use ibuprofen or tylenol as directed on packaging for pain or fever.  Do not take multiple medicines containing Tylenol or acetaminophen to avoid taking too much of this medication.  Follow-up instructions: Please follow-up with your primary care provider in the next 3 days for further evaluation of your symptoms if you are not feeling better.   Return instructions:   Please return to the Emergency Department if you experience worsening symptoms.   RETURN IMMEDIATELY IF you develop shortness of breath,  confusion or altered mental status, a new rash, become dizzy, faint, or poorly responsive, or are unable to be cared for at home.  Please return if you have persistent vomiting and cannot keep down fluids or develop a fever that is not controlled by tylenol or motrin.    Please return if you have any other emergent concerns.  Additional Information:  Your vital signs today were: BP 126/69 mmHg   Pulse 100   Temp(Src) 98.8 F (37.1 C) (Oral)   Resp 18   SpO2 100%   LMP 09/16/2015 If your blood pressure (BP) was elevated above 135/85 this visit, please have this repeated by your doctor within one month. --------------

## 2015-11-25 NOTE — ED Provider Notes (Signed)
CSN: [id number]     Arrival date & time 11/25/15  1717 History   First MD Initiated Contact with Patient 11/25/15 1849     Chief Complaint  Patient presents with  . Facial Pain  . Sore Throat     (Consider location/radiation/quality/duration/timing/severity/associated sxs/prior Treatment) HPI Comments: Patient who is approximately [redacted] weeks pregnant presents with five-day history of URI symptoms including nasal congestion and sore throat. Patient has not had any fevers, ear pain, vomiting, or diarrhea. She has not had any cough or chest pains. Yesterday the patient developed right-sided sinus tenderness and fullness. She is not taking any medications because she is pregnant and doesn't know what to take. No known sick contacts. No other complaints.  The history is provided by the patient.    Past Medical History  Diagnosis Date  . Pregnant   . Infection 02/2017    CHLAMYDIA  . Anemia 2010    FE  . Wrist fracture 2009 X2  . Syncope    Past Surgical History  Procedure Laterality Date  . No past surgeries     Family History  Problem Relation Age of Onset  . Kidney disease Mother     FAILURE  . Heart disease Mother     CHF  . Early death Mother 2231  . Hypertension Mother   . Thrombophlebitis Mother   . Hyperlipidemia Maternal Grandmother   . Hypertension Maternal Grandmother    Social History  Substance Use Topics  . Smoking status: Never Smoker   . Smokeless tobacco: Never Used  . Alcohol Use: No     Comment: RARELY   OB History    Gravida Para Term Preterm AB TAB SAB Ectopic Multiple Living   3 1 1  1     1      Review of Systems  Constitutional: Negative for fever, chills and fatigue.  HENT: Positive for congestion, rhinorrhea, sinus pressure and sore throat. Negative for ear pain.   Eyes: Negative for redness.  Respiratory: Negative for cough and wheezing.   Gastrointestinal: Negative for nausea, vomiting, abdominal pain and diarrhea.  Genitourinary: Negative  for dysuria.  Musculoskeletal: Negative for myalgias and neck stiffness.  Skin: Negative for rash.  Neurological: Negative for headaches.  Hematological: Negative for adenopathy.    Allergies  Review of patient's allergies indicates no known allergies.  Home Medications   Prior to Admission medications   Medication Sig Start Date End Date Taking? Authorizing Provider  SUMAtriptan (IMITREX) 50 MG tablet Take 1 tablet (50 mg total) by mouth every 2 (two) hours as needed for migraine. May repeat in 2 hours if headache persists or recurs. 02/04/15   Levert FeinsteinYijun Yan, MD   BP 126/69 mmHg  Pulse 100  Temp(Src) 98.8 F (37.1 C) (Oral)  Resp 18  SpO2 100%  LMP 09/16/2015   Physical Exam  Constitutional: She appears well-developed and well-nourished.  HENT:  Head: Normocephalic and atraumatic.  Right Ear: Tympanic membrane, external ear and ear canal normal.  Left Ear: Tympanic membrane, external ear and ear canal normal.  Nose: Mucosal edema and rhinorrhea present. Right sinus exhibits maxillary sinus tenderness. Right sinus exhibits no frontal sinus tenderness. Left sinus exhibits no maxillary sinus tenderness and no frontal sinus tenderness.    Mouth/Throat: Uvula is midline, oropharynx is clear and moist and mucous membranes are normal. Mucous membranes are not dry. No oral lesions. No trismus in the jaw. No uvula swelling. No oropharyngeal exudate, posterior oropharyngeal edema, posterior oropharyngeal erythema or tonsillar  abscesses.  Eyes: Conjunctivae are normal. Right eye exhibits no discharge. Left eye exhibits no discharge.  Neck: Normal range of motion. Neck supple.  Cardiovascular: Normal rate, regular rhythm and normal heart sounds.   Pulmonary/Chest: Effort normal and breath sounds normal. No respiratory distress. She has no wheezes. She has no rales.  Abdominal: Soft. There is no tenderness.  Lymphadenopathy:    She has no cervical adenopathy.  Neurological: She is alert.    Skin: Skin is warm and dry.  Psychiatric: She has a normal mood and affect.  Nursing note and vitals reviewed.   ED Course  Procedures (including critical care time)  7:55 PM Patient seen and examined.   Vital signs reviewed and are as follows: BP 126/69 mmHg  Pulse 100  Temp(Src) 98.8 F (37.1 C) (Oral)  Resp 18  SpO2 100%  LMP 09/16/2015  Counseled on use of Tylenol and nasal saline flushes. Inform patient of negative strep test.  Patient counseled on supportive care for viral URI and s/s to return including worsening symptoms, persistent fever, persistent vomiting, or if they have any other concerns. Urged to see PCP if symptoms persist for more than 3 days. Patient verbalizes understanding and agrees with plan.    MDM   Final diagnoses:  Acute maxillary sinusitis, recurrence not specified  Upper respiratory infection   Sinusitis/URI: Patient with signs and symptoms consistent with sinusitis. Given that she is pregnant, will avoid decongestant medications. Will give Tylenol and encouraged nasal saline flushes. Patient otherwise appears well. No chest pain, abdominal pain, shortness of breath or cough. No indication for further testing at this time.    Renne CriglerJoshua Taraoluwa Thakur, PA-C 11/25/15 1957  Richardean Canalavid H Yao, MD 11/26/15 [phone]

## 2015-11-25 NOTE — ED Notes (Signed)
Pt states that she has been having a sore throat and facial pain when she blows her nose since Friday. States she is ten weeks pregnant and doesn't know what medications she can take. Alert and oriented.

## 2015-11-28 LAB — CULTURE, GROUP A STREP (THRC)

## 2015-12-14 ENCOUNTER — Encounter: Payer: Self-pay | Admitting: Obstetrics and Gynecology

## 2015-12-14 ENCOUNTER — Ambulatory Visit (INDEPENDENT_AMBULATORY_CARE_PROVIDER_SITE_OTHER): Payer: Medicaid Other | Admitting: Obstetrics and Gynecology

## 2015-12-14 VITALS — BP 114/77 | HR 105 | Wt 128.0 lb

## 2015-12-14 DIAGNOSIS — Z3490 Encounter for supervision of normal pregnancy, unspecified, unspecified trimester: Secondary | ICD-10-CM | POA: Insufficient documentation

## 2015-12-14 DIAGNOSIS — Z3491 Encounter for supervision of normal pregnancy, unspecified, first trimester: Secondary | ICD-10-CM | POA: Diagnosis not present

## 2015-12-14 DIAGNOSIS — R87612 Low grade squamous intraepithelial lesion on cytologic smear of cervix (LGSIL): Secondary | ICD-10-CM

## 2015-12-14 DIAGNOSIS — IMO0002 Reserved for concepts with insufficient information to code with codable children: Secondary | ICD-10-CM

## 2015-12-14 NOTE — Progress Notes (Signed)
New OB Note  12/14/2015   Clinic: [facility]  Chief Complaint: NOB  Transfer of Care Patient: no  History of Present Illness: Ms. Martinsen is a 25 y.o. G3P1011 @ 12/5 weeks (EDC 1/31, based on Patient's last menstrual period was 09/16/2015.=bedside u/s 13wks today and 7wk u/s at an outside facility), with the above CC. Preg complicated by has LGSIL (low grade squamous intraepithelial dysplasia) and Supervision of low-risk pregnancy on her problem list.  Her periods were: regular She was using no method when she conceived.  She has Positive signs or symptoms of nausea/vomiting of pregnancy (mild) She has Negative signs or symptoms of miscarriage or preterm labor She identifies Negative Zika risk factors for her and her partner  ROS: A 12-point review of systems was performed and negative, except as stated in the above HPI.  OBGYN History: As per HPI. OB History  Gravida Para Term Preterm AB Living  3 1 1  0 1 1  SAB TAB Ectopic Multiple Live Births  0 1 0 0 1    # Outcome Date GA Lbr Len/2nd Weight Sex Delivery Anes PTL Lv  3 Current           2 Term 10/31/12 [redacted]w[redacted]d 13:30 / 01:44 6 lb 6.1 oz (2.895 kg) F Vag-Spont EPI  LIV  1 TAB 10/2011 [redacted]w[redacted]d    TAB         Any prior children are healthy, doing well, without any problems or issues: yes History of pap smears: Yes. Last pap smear 2015. Abnormal: yes LSIL History of STIs: Yes   Past Medical History: Past Medical History:  Diagnosis Date  . Anemia 2010   FE  . Infection 02/2017   CHLAMYDIA  . Pregnant   . Syncope   . Wrist fracture 2009 X2    Past Surgical History: Past Surgical History:  Procedure Laterality Date  . NO PAST SURGERIES      Family History:  Family History  Problem Relation Age of Onset  . Kidney disease Mother     FAILURE  . Heart disease Mother     CHF  . Early death Mother 23  . Hypertension Mother   . Thrombophlebitis Mother   . Hyperlipidemia Maternal Grandmother   . Hypertension Maternal  Grandmother     She denies any history of mental retardation, birth defects or genetic disorders in her or the FOB's history  Social History:  Social History   Social History  . Marital status: Single    Spouse name: N/A  . Number of children: 1  . Years of education: 20   Occupational History  . 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 Smoking status: Never Smoker  . Smokeless tobacco: Never Used  . Alcohol use No     Comment: RARELY  . Drug use: No  . Sexual activity: Yes    Partners: Male    Birth control/ protection: None   Other Topics Concern  . Not on file   Social History Narrative   Lives at home with boyfriend and daughter.   Right-handed.   No caffeine use.    Allergy: No Known Allergies  Health Maintenance:  Mammogram Up to Date: not applicable  Current Outpatient Medications: PNV  Physical Exam:   BP 114/77   Pulse (!) 105   Wt 128 lb (58.1 kg)   LMP 09/16/2015   BMI 20.66 kg/m  Body mass index is 20.66 kg/m. Fundal height: not applicable FHTs: normal  by bedside u/s  General appearance: Well nourished, well developed female in no acute distress.  Neck:  Supple, normal appearance, and no thyromegaly  Cardiovascular: S1, S2 normal, no murmur, rub or gallop, regular rate and rhythm Respiratory:  Clear to auscultation bilateral. Normal respiratory effort Abdomen: positive bowel sounds and no masses, hernias; diffusely non tender to palpation, non distended Breasts: not examined. Neuro/Psych:  Normal mood and affect.  Skin:  Warm and dry.  Lymphatic:  No inguinal lymphadenopathy.   Pelvic exam: is not limited by body habitus EGBUS: within normal limits, Vagina: within normal limits and with no blood in the vault, Cervix: normal appearing cervix without discharge or lesions, closed/long/high, Uterus:  enlarged, c/w 12-14 week size, and Adnexa:  normal adnexa and no mass, fullness, tenderness  Laboratory: neg  Imaging:   Beside u/s c/w 13/1 by [facility], SLI[facility]  Assessment: patient doing well  Plan: 1. Supervision of low-risk pregnancy, first trimester Routine NOB labs today. Anatomy u/s ordered for 19wks. Amenable to genetic screening-->do quad screen. Prior [place] testing negative.  - Prenatal Profile I - Culture, OB Urine - Cystic fibrosis gene test - HIV antibody (with reflex) - US OB Comp + 14 Wk; Future   2. Low grade squamous intraepith lesion on cytologic smear cervix (lgsil). - Pap Lb, Ct-Ng, rfx HPV ASCU   Problem list reviewed and updated.  Follow up in 2-3 weeks.  >50% of 25 min visit spent on counseling and coordination of care.     Cornelia Copa MD Attending Center for [facility])

## 2015-12-14 NOTE — Addendum Note (Signed)
Addended by: Arne [place] on: 12/14/2015 11:18 AM   Modules accepted: Orders

## 2015-12-14 NOTE — Progress Notes (Signed)
Pt here for initial prenatal visit. She states she has had [place] and labs previously but nothing in our file. Bedside US shows single IUP measuring [redacted]w[redacted]d by HC and FHR 154

## 2015-12-15 LAB — GC/CHLAMYDIA PROBE AMP
Chlamydia trachomatis, NAA: NEGATIVE
Neisseria gonorrhoeae by PCR: NEGATIVE

## 2015-12-16 LAB — PAP IG W/ RFLX HPV ASCU: PAP Smear Comment: 0

## 2015-12-16 LAB — CULTURE, OB URINE

## 2015-12-16 LAB — URINE CULTURE, OB REFLEX

## 2015-12-21 LAB — PRENATAL PROFILE I(LABCORP)
Antibody Screen: NEGATIVE
Basophils Absolute: 0 10*3/uL (ref 0.0–0.2)
Basos: 0 %
EOS (ABSOLUTE): 0.1 10*3/uL (ref 0.0–0.4)
Eos: 1 %
Hematocrit: 35.1 % (ref 34.0–46.6)
Hemoglobin: 11 g/dL — ABNORMAL LOW (ref 11.1–15.9)
Hepatitis B Surface Ag: NEGATIVE
Immature Grans (Abs): 0 10*3/uL (ref 0.0–0.1)
Immature Granulocytes: 0 %
Lymphocytes Absolute: 1.4 10*3/uL (ref 0.7–3.1)
Lymphs: 26 %
MCH: 24.1 pg — ABNORMAL LOW (ref 26.6–33.0)
MCHC: 31.3 g/dL — ABNORMAL LOW (ref 31.5–35.7)
MCV: 77 fL — ABNORMAL LOW (ref 79–97)
Monocytes Absolute: 0.4 10*3/uL (ref 0.1–0.9)
Monocytes: 8 %
Neutrophils Absolute: 3.4 10*3/uL (ref 1.4–7.0)
Neutrophils: 65 %
Platelets: 370 10*3/uL (ref 150–379)
RBC: 4.57 x10E6/uL (ref 3.77–5.28)
RDW: 15.2 % (ref 12.3–15.4)
RPR Ser Ql: NONREACTIVE
Rh Factor: POSITIVE
Rubella Antibodies, IGG: 4.51 index (ref >0.99)
WBC: 5.3 10*3/uL (ref 3.4–10.8)

## 2015-12-21 LAB — HIV ANTIBODY (ROUTINE TESTING W REFLEX): HIV Screen 4th Generation wRfx: NONREACTIVE

## 2015-12-21 LAB — CYSTIC FIBROSIS GENE TEST

## 2016-01-13 ENCOUNTER — Encounter (HOSPITAL_COMMUNITY): Payer: Self-pay

## 2016-01-13 ENCOUNTER — Inpatient Hospital Stay (HOSPITAL_COMMUNITY)
Admission: AD | Admit: 2016-01-13 | Discharge: 2016-01-14 | Disposition: A | Discharge status: Home / Self Care | Payer: Medicaid Other | Source: Ambulatory Visit | Attending: Obstetrics & Gynecology | Admitting: Obstetrics & Gynecology

## 2016-01-13 DIAGNOSIS — G43009 Migraine without aura, not intractable, without status migrainosus: Secondary | ICD-10-CM | POA: Diagnosis present

## 2016-01-13 DIAGNOSIS — Z3A17 17 weeks gestation of pregnancy: Secondary | ICD-10-CM | POA: Insufficient documentation

## 2016-01-13 DIAGNOSIS — O26892 Other specified pregnancy related conditions, second trimester: Secondary | ICD-10-CM | POA: Insufficient documentation

## 2016-01-13 HISTORY — DX: Headache, unspecified: R51.9

## 2016-01-13 HISTORY — DX: Headache: R51

## 2016-01-13 MED ORDER — SODIUM CHLORIDE 0.9 % IV SOLN
INTRAVENOUS | Status: DC
  Administered 2016-01-13: via INTRAVENOUS

## 2016-01-13 MED ORDER — DIPHENHYDRAMINE HCL 50 MG/ML IJ SOLN
25.0000 mg | Freq: Once | INTRAMUSCULAR | Status: AC
  Administered 2016-01-14: 25 mg via INTRAVENOUS
  Filled 2016-01-13: qty 1

## 2016-01-13 MED ORDER — METOCLOPRAMIDE HCL 5 MG/ML IJ SOLN
10.0000 mg | Freq: Once | INTRAMUSCULAR | Status: AC
  Administered 2016-01-14: 10 mg via INTRAVENOUS
  Filled 2016-01-13: qty 2

## 2016-01-13 MED ORDER — DEXAMETHASONE SODIUM PHOSPHATE 10 MG/ML IJ SOLN
10.0000 mg | Freq: Once | INTRAMUSCULAR | Status: AC
  Administered 2016-01-14: 10 mg via INTRAVENOUS
  Filled 2016-01-13: qty 1

## 2016-01-13 NOTE — MAU Note (Addendum)
Pt c/o HA every since Saturday. States that she has taken tylenol but it does not help. States she last took 1 regular strength tylenol at 1400. Has hx of migraines and was recently told to stop taking medication prescribed for them.

## 2016-01-13 NOTE — MAU Provider Note (Signed)
Chief Complaint:  No chief complaint on file.   None    HPI: Anne Baxter is a 25 y.o. G3P1011 at 9253w0d who presents to maternity admissions reporting migraine.  Patient has known migraines, was seen in past by neurologist, and put on a medication. Per patient, due to being pregnant, she was told to not take the medication. Medication: sumatriptan. Her history of migraines involved LOC in past.    Migraine started around noon, stopped around 3PM, started around 6PM again and has not gone away. She took tylenol (325 mg) at [facility], did not really help. Pain is currently right sided, throbbing and sharp, associated with nausea. Nothing makes it worse. Laying down in dark rooms help. +Photophobia. No vision changes.  Denies contractions, leakage of fluid or vaginal bleeding. Good fetal movement.   Pregnancy Course:  Uncomplicated.   Past Medical History: Past Medical History:  Diagnosis Date  . Anemia 2010   FE  . Headache   . Infection 02/2017   CHLAMYDIA  . Pregnant   . Syncope   . Wrist fracture 2009 X2    Past obstetric history: OB History  Gravida Para Term Preterm AB Living  3 1 1  0 1 1  SAB TAB Ectopic Multiple Live Births  0 1 0 0 1    # Outcome Date GA Lbr Len/2nd Weight Sex Delivery Anes PTL Lv  3 Current           2 Term 10/31/12 6821w3d 13:30 / 01:44 6 lb 6.1 oz (2.895 kg) F Vag-Spont EPI  LIV  1 TAB 10/2011 654w0d    TAB         Past Surgical History: Past Surgical History:  Procedure Laterality Date  . NO PAST SURGERIES       Family History: Family History  Problem Relation Age of Onset  . Kidney disease Mother     FAILURE  . Heart disease Mother     CHF  . Early death Mother 7631  . Hypertension Mother   . Thrombophlebitis Mother   . Hyperlipidemia Maternal Grandmother   . Hypertension Maternal Grandmother     Social History: Social History  Substance Use Topics  . Smoking status: Never Smoker  . Smokeless tobacco: Never Used  . Alcohol use No     Comment: RARELY    Allergies: No Known Allergies  Meds:  Prescriptions Prior to Admission  Medication Sig Dispense Refill Last Dose  . acetaminophen (TYLENOL) 325 MG tablet Take 650 mg by mouth every 6 (six) hours as needed.   01/13/2016 at 1400    I have reviewed patient's Past Medical Hx, Surgical Hx, Family Hx, Social Hx, medications and allergies.   ROS:  Review of Systems  Comprehensive ROS was normal except stated in HPI.   Physical Exam  Patient Vitals for the past 24 hrs:  BP Temp Temp src Pulse Resp SpO2 Height Weight  01/13/16 2036 113/68 98.9 F (37.2 C) Oral 98 16 100 % 5\' 6"  (1.676 m) 132 lb (59.9 kg)   Constitutional: Well-developed, well-nourished female in no acute distress.  Cardiovascular: normal rate Respiratory: normal effort GI: Abd soft, non-tender, gravid appropriate for gestational age. Pos BS x 4 MS: Extremities nontender, no edema, normal ROM Neurologic: Alert and oriented x 4.  GU: Neg CVAT.   FHR: 131 bpm doppler  Labs: No results found for this or any previous visit (from the past 24 hour(s)).  Imaging:  No results found.  MAU Course: Migraine  cocktail  00:32 - Migraine is gone. Ready to go home.    MDM: Plan of care reviewed with patient, including labs and tests ordered and medical treatment. To start new migraine medication.   Assessment: 1. Migraine without aura and without status migrainosus, not intractable     Plan: Discharge home in stable condition.  Start new migraine medication To follow up with neurologist, Dr. Terrace ArabiaYan OB follow up per routine visits.     Medication List    TAKE these medications   acetaminophen 325 MG tablet Commonly known as:  TYLENOL Take 650 mg by mouth every 6 (six) hours as needed.   propranolol ER 80 MG 24 hr capsule Commonly known as:  INDERAL LA Take 1 capsule (80 mg total) by mouth daily.       [address] [place], [place] 01/13/2016 11:27 PM

## 2016-01-13 NOTE — MAU Note (Signed)
Urine in lab 

## 2016-01-13 NOTE — MAU Note (Signed)
Pt reports she has been having "real bad migraines"for the last 3 days. States she has medicine for migraines but they had told her not to take it because of the pregnancy. Also reports right lower abd pain for the last 2 hours.

## 2016-01-14 DIAGNOSIS — G43009 Migraine without aura, not intractable, without status migrainosus: Secondary | ICD-10-CM

## 2016-01-14 MED ORDER — PROPRANOLOL HCL ER 80 MG PO CP24: 80.0000 mg | ORAL_CAPSULE | Freq: Every day | ORAL | 0 refills | Status: DC

## 2016-01-14 NOTE — Discharge Instructions (Signed)
Recurrent Migraine Headache °A migraine headache is very bad, throbbing pain on one or both sides of your head. Recurrent migraines keep coming back. Talk to your doctor about what things may bring on (trigger) your migraine headaches. °HOME CARE °· Only take medicines as told by your doctor. °· Lie down in a dark, quiet room when you have a migraine. °· Keep a journal to find out if certain things bring on migraine headaches. For example, write down: °¨ What you eat and drink. °¨ How much sleep you get. °¨ Any change to your diet or medicines. °· Lessen how much alcohol you drink. °· Quit smoking if you smoke. °· Get enough sleep. °· Lessen any stress in your life. °· Keep lights dim if bright lights bother you or make your migraines worse. °GET HELP IF: °· Medicine does not help your migraines. °· Your pain keeps coming back. °· You have a fever. °GET HELP RIGHT AWAY IF:  °· Your migraine becomes really bad. °· You have a stiff neck. °· You have trouble seeing. °· Your muscles are weak, or you lose muscle control. °· You lose your balance or have trouble walking. °· You feel like you will pass out (faint), or you pass out. °· You have really bad symptoms that are different than your first symptoms. °MAKE SURE YOU:  °· Understand these instructions. °· Will watch your condition. °· Will get help right away if you are not doing well or get worse. °  °This information is not intended to replace advice given to you by your health care provider. Make sure you discuss any questions you have with your health care provider. °  °Document Released: 02/16/2008 Document Revised: 05/14/2013 Document Reviewed: 01/14/2013 °Elsevier Interactive Patient Education ©2016 Elsevier Inc. ° °

## 2016-01-18 ENCOUNTER — Ambulatory Visit (INDEPENDENT_AMBULATORY_CARE_PROVIDER_SITE_OTHER): Payer: Medicaid Other | Admitting: Obstetrics & Gynecology

## 2016-01-18 VITALS — BP 110/72 | HR 104 | Temp 98.5°F | Wt 132.9 lb

## 2016-01-18 DIAGNOSIS — Z3492 Encounter for supervision of normal pregnancy, unspecified, second trimester: Secondary | ICD-10-CM | POA: Diagnosis not present

## 2016-01-18 NOTE — Patient Instructions (Signed)

## 2016-01-18 NOTE — Progress Notes (Signed)
Subjective:  Anne Baxter is a 25 y.o. G3P1011 at 4540w5d being seen today for ongoing prenatal care.  She is currently monitored for the following issues for this high-risk pregnancy and has Supervision of low-risk pregnancy on her problem list.  Patient reports no complaints.  Contractions: Not present. Vag. Bleeding: None.  Movement: Absent. Denies leaking of fluid.   The following portions of the patient's history were reviewed and updated as appropriate: allergies, current medications, past family history, past medical history, past social history, past surgical history and problem list. Problem list updated.  Objective:   Vitals:   01/18/16 1030  BP: 110/72  Pulse: (!) 104  Temp: 98.5 F (36.9 C)  Weight: 132 lb 14.4 oz (60.3 kg)    Fetal Status: Fetal Heart Rate (bpm): 150 Fundal Height: 18 cm Movement: Absent     General:  Alert, oriented and cooperative. Patient is in no acute distress.  Skin: Skin is warm and dry. No rash noted.   Cardiovascular: Normal heart rate noted  Respiratory: Normal respiratory effort, no problems with respiration noted  Abdomen: Soft, gravid, appropriate for gestational age. Pain/Pressure: Present     Pelvic:  Cervical exam deferred        Extremities: Normal range of motion.  Edema: None  Mental Status: Normal mood and affect. Normal behavior. Normal judgment and thought content.   Urinalysis:      Assessment and Plan:  Pregnancy: G3P1011 at 8240w5d  1. Supervision of low-risk pregnancy, second trimester US is scheduled  Preterm labor symptoms and general obstetric precautions including but not limited to vaginal bleeding, contractions, leaking of fluid and fetal movement were reviewed in detail with the patient. Please refer to After Visit Summary for other counseling recommendations.  Return in about 4 weeks (around 02/15/2016). Quad screen  Adam PhenixJames G Menachem Urbanek, MD

## 2016-01-22 ENCOUNTER — Other Ambulatory Visit: Payer: Self-pay

## 2016-01-22 DIAGNOSIS — Z3490 Encounter for supervision of normal pregnancy, unspecified, unspecified trimester: Secondary | ICD-10-CM

## 2016-01-28 ENCOUNTER — Other Ambulatory Visit: Payer: Medicaid Other

## 2016-02-01 ENCOUNTER — Ambulatory Visit (HOSPITAL_COMMUNITY)
Admission: RE | Admit: 2016-02-01 | Discharge: 2016-02-01 | Disposition: A | Discharge status: Home / Self Care | Payer: Medicaid Other | Source: Ambulatory Visit | Attending: Obstetrics & Gynecology | Admitting: Obstetrics & Gynecology

## 2016-02-01 DIAGNOSIS — Z3490 Encounter for supervision of normal pregnancy, unspecified, unspecified trimester: Secondary | ICD-10-CM

## 2016-02-01 DIAGNOSIS — Z36 Encounter for antenatal screening of mother: Secondary | ICD-10-CM | POA: Diagnosis not present

## 2016-02-01 DIAGNOSIS — Z3A19 19 weeks gestation of pregnancy: Secondary | ICD-10-CM | POA: Insufficient documentation

## 2016-02-16 ENCOUNTER — Ambulatory Visit (INDEPENDENT_AMBULATORY_CARE_PROVIDER_SITE_OTHER): Payer: Medicaid Other | Admitting: Obstetrics & Gynecology

## 2016-02-16 VITALS — BP 104/71 | HR 82 | Temp 98.7°F | Wt 140.2 lb

## 2016-02-16 DIAGNOSIS — Z3492 Encounter for supervision of normal pregnancy, unspecified, second trimester: Secondary | ICD-10-CM | POA: Diagnosis not present

## 2016-02-16 NOTE — Progress Notes (Signed)
   PRENATAL VISIT NOTE  Subjective:  Anne Baxter is a 25 y.o. G3P1011 at 3472w6d being seen today for ongoing prenatal care.  She is currently monitored for the following issues for this low-risk pregnancy and has Supervision of low-risk pregnancy on her problem list.  Patient reports RUQ pain at rib cage.  Contractions: Not present. Vag. Bleeding: None.  Movement: Present. Denies leaking of fluid.   The following portions of the patient's history were reviewed and updated as appropriate: allergies, current medications, past family history, past medical history, past social history, past surgical history and problem list. Problem list updated.  Objective:   Vitals:   02/16/16 1625  BP: 104/71  Pulse: 82  Temp: 98.7 F (37.1 C)  Weight: 140 lb 3.2 oz (63.6 kg)    Fetal Status: Fetal Heart Rate (bpm): 139 Fundal Height: 22 cm Movement: Present     General:  Alert, oriented and cooperative. Patient is in no acute distress.  Skin: Skin is warm and dry. No rash noted.   Cardiovascular: Normal heart rate noted  Respiratory: Normal respiratory effort, no problems with respiration noted  Abdomen: Soft, gravid, appropriate for gestational age. Pain/Pressure: Present   Not tender, no Murphy's sign  Pelvic:  Cervical exam deferred        Extremities: Normal range of motion.  Edema: None  Mental Status: Normal mood and affect. Normal behavior. Normal judgment and thought content.   Urinalysis: Urine Protein: Negative Urine Glucose: Negative  Assessment and Plan:  Pregnancy: G3P1011 at 1072w6d  1. Supervision of low-risk pregnancy, second trimester MS RUQ pain, Tylenol and heat, ice ok  Preterm labor symptoms and general obstetric precautions including but not limited to vaginal bleeding, contractions, leaking of fluid and fetal movement were reviewed in detail with the patient. Please refer to After Visit Summary for other counseling recommendations.  Return in about 4 weeks (around  03/15/2016). 2 hr GTT next  Adam PhenixJames G Arnold, MD

## 2016-02-16 NOTE — Progress Notes (Signed)
Patient states that she has constant pain on the upper right side of abdomen, but other than that she is doing well.

## 2016-02-17 LAB — AFP, QUAD SCREEN
DIA Mom Value: 1
DIA Value (EIA): 188.29 pg/mL
DSR (By Age)    1 IN: 1008
DSR (Second Trimester) 1 IN: 752
Gestational Age: 17.7 WEEKS
MSAFP Mom: 0.64
MSAFP: 31.4 ng/mL
MSHCG Mom: 1.64
MSHCG: 52176 m[IU]/mL
Maternal Age At EDD: 25.5 YEARS
Osb Risk: 10000
T18 (By Age): 1:3926 {titer}
Test Results:: NEGATIVE
Weight: 132 [lb_av]
uE3 Mom: 0.7
uE3 Value: 0.91 ng/mL

## 2016-03-07 ENCOUNTER — Encounter: Payer: Self-pay | Admitting: *Deleted

## 2016-03-16 ENCOUNTER — Other Ambulatory Visit: Payer: Medicaid Other

## 2016-03-16 ENCOUNTER — Ambulatory Visit (INDEPENDENT_AMBULATORY_CARE_PROVIDER_SITE_OTHER): Payer: Medicaid Other | Admitting: Obstetrics and Gynecology

## 2016-03-16 DIAGNOSIS — Z23 Encounter for immunization: Secondary | ICD-10-CM

## 2016-03-16 DIAGNOSIS — Z8669 Personal history of other diseases of the nervous system and sense organs: Secondary | ICD-10-CM

## 2016-03-16 DIAGNOSIS — Z3492 Encounter for supervision of normal pregnancy, unspecified, second trimester: Secondary | ICD-10-CM

## 2016-03-16 MED ORDER — PREPLUS 27-1 MG PO TABS: 1.0000 | ORAL_TABLET | Freq: Every day | ORAL | 13 refills | Status: DC

## 2016-03-16 NOTE — Progress Notes (Signed)
Patient is in the office states that she feels good other than feeling tired, reports good fetal movement.

## 2016-03-16 NOTE — Addendum Note (Signed)
Addended by: STALLING, BRITTANY D on: 03/16/2016 10:26 AM   Modules accepted: Orders  

## 2016-03-16 NOTE — Progress Notes (Signed)
Subjective:  Anne Baxter is a 25 y.o. G3P1011 at 369w0d being seen today for ongoing prenatal care.  She is currently monitored for the following issues for this low-risk pregnancy and has Supervision of low-risk pregnancy on her problem list.  Patient reports no complaints.  Contractions: Not present. Vag. Bleeding: None.  Movement: Present. Denies leaking of fluid.   The following portions of the patient's history were reviewed and updated as appropriate: allergies, current medications, past family history, past medical history, past social history, past surgical history and problem list. Problem list updated.  Objective:   Vitals:   03/16/16 0853  BP: 113/77  Pulse: 96  Temp: 97.9 F (36.6 C)  Weight: 147 lb 9.6 oz (67 kg)    Fetal Status: Fetal Heart Rate (bpm): 140 Fundal Height: 26 cm Movement: Present     General:  Alert, oriented and cooperative. Patient is in no acute distress.  Skin: Skin is warm and dry. No rash noted.   Cardiovascular: Normal heart rate noted  Respiratory: Normal respiratory effort, no problems with respiration noted  Abdomen: Soft, gravid, appropriate for gestational age. Pain/Pressure: Absent     Pelvic:  Cervical exam deferred        Extremities: Normal range of motion.  Edema: None  Mental Status: Normal mood and affect. Normal behavior. Normal judgment and thought content.   Urinalysis:      Assessment and Plan:  Pregnancy: G3P1011 at 419w0d  1. Encounter for supervision of low-risk pregnancy in second trimester Flu vaccine today 28 week labs today Considering PPBTL - US MFM OB FOLLOW UP; Future  Preterm labor symptoms and general obstetric precautions including but not limited to vaginal bleeding, contractions, leaking of fluid and fetal movement were reviewed in detail with the patient. Please refer to After Visit Summary for other counseling recommendations.  No Follow-up on file.   Hermina StaggersMichael L Sarah-Jane Nazario, MD

## 2016-03-17 LAB — GLUCOSE TOLERANCE, 2 HOURS W/ 1HR
Glucose, 1 hour: 148 mg/dL (ref 65–179)
Glucose, 2 hour: 100 mg/dL (ref 65–152)
Glucose, Fasting: 75 mg/dL (ref 65–91)

## 2016-03-17 LAB — CBC
Hematocrit: 28.9 % — ABNORMAL LOW (ref 34.0–46.6)
Hemoglobin: 9.1 g/dL — ABNORMAL LOW (ref 11.1–15.9)
MCH: 24.2 pg — ABNORMAL LOW (ref 26.6–33.0)
MCHC: 31.5 g/dL (ref 31.5–35.7)
MCV: 77 fL — ABNORMAL LOW (ref 79–97)
Platelets: 281 10*3/uL (ref 150–379)
RBC: 3.76 x10E6/uL — ABNORMAL LOW (ref 3.77–5.28)
RDW: 15.4 % (ref 12.3–15.4)
WBC: 5.7 10*3/uL (ref 3.4–10.8)

## 2016-03-17 LAB — RPR: RPR Ser Ql: NONREACTIVE

## 2016-03-17 LAB — HIV ANTIBODY (ROUTINE TESTING W REFLEX): HIV Screen 4th Generation wRfx: NONREACTIVE

## 2016-03-23 ENCOUNTER — Ambulatory Visit (HOSPITAL_COMMUNITY): Payer: Medicaid Other

## 2016-03-25 ENCOUNTER — Other Ambulatory Visit: Payer: Self-pay | Admitting: Obstetrics and Gynecology

## 2016-03-25 ENCOUNTER — Encounter (HOSPITAL_COMMUNITY): Payer: Self-pay

## 2016-03-25 ENCOUNTER — Ambulatory Visit (HOSPITAL_COMMUNITY)
Admission: RE | Admit: 2016-03-25 | Discharge: 2016-03-25 | Disposition: A | Discharge status: Home / Self Care | Payer: Medicaid Other | Source: Ambulatory Visit | Attending: Obstetrics and Gynecology | Admitting: Obstetrics and Gynecology

## 2016-03-25 DIAGNOSIS — Z3A27 27 weeks gestation of pregnancy: Secondary | ICD-10-CM

## 2016-03-25 DIAGNOSIS — Z362 Encounter for other antenatal screening follow-up: Secondary | ICD-10-CM

## 2016-03-25 DIAGNOSIS — Z3492 Encounter for supervision of normal pregnancy, unspecified, second trimester: Secondary | ICD-10-CM

## 2016-03-25 DIAGNOSIS — Z23 Encounter for immunization: Secondary | ICD-10-CM

## 2016-03-30 ENCOUNTER — Ambulatory Visit (HOSPITAL_COMMUNITY): Payer: Medicaid Other

## 2016-04-05 ENCOUNTER — Ambulatory Visit (INDEPENDENT_AMBULATORY_CARE_PROVIDER_SITE_OTHER): Payer: Medicaid Other | Admitting: Obstetrics & Gynecology

## 2016-04-05 DIAGNOSIS — Z3492 Encounter for supervision of normal pregnancy, unspecified, second trimester: Secondary | ICD-10-CM

## 2016-04-05 NOTE — Progress Notes (Signed)
Patient states that she feels good, reports good fetal movement. 

## 2016-04-05 NOTE — Progress Notes (Signed)
   PRENATAL VISIT NOTE  Subjective:  Anne Baxter is a 25 y.o. G3P1011 at 681w6d being seen today for ongoing prenatal care.  She is currently monitored for the following issues for this low-risk pregnancy and has Supervision of low-risk pregnancy and History of migraine headaches on her problem list.  Patient reports no complaints.  Contractions: Not present. Vag. Bleeding: None.  Movement: Present. Denies leaking of fluid.   The following portions of the patient's history were reviewed and updated as appropriate: allergies, current medications, past family history, past medical history, past social history, past surgical history and problem list. Problem list updated.  Objective:   Vitals:   04/05/16 1621  BP: 104/70  Pulse: (!) 101  Temp: 98.6 F (37 C)  Weight: 149 lb 12.8 oz (67.9 kg)    Fetal Status: Fetal Heart Rate (bpm): 143 Fundal Height: 29 cm Movement: Present     General:  Alert, oriented and cooperative. Patient is in no acute distress.  Skin: Skin is warm and dry. No rash noted.   Cardiovascular: Normal heart rate noted  Respiratory: Normal respiratory effort, no problems with respiration noted  Abdomen: Soft, gravid, appropriate for gestational age. Pain/Pressure: Absent     Pelvic:  Cervical exam deferred        Extremities: Normal range of motion.  Edema: None  Mental Status: Normal mood and affect. Normal behavior. Normal judgment and thought content.   Assessment and Plan:  Pregnancy: G3P1011 at 411w6d  1. Encounter for supervision of low-risk pregnancy in second trimester Normal screening  Preterm labor symptoms and general obstetric precautions including but not limited to vaginal bleeding, contractions, leaking of fluid and fetal movement were reviewed in detail with the patient. Please refer to After Visit Summary for other counseling recommendations.  Return in about 2 weeks (around 04/19/2016).   Adam PhenixJames G Danayah Smyre, MD

## 2016-04-20 ENCOUNTER — Encounter: Payer: Medicaid Other | Admitting: Obstetrics and Gynecology

## 2016-05-04 ENCOUNTER — Ambulatory Visit (INDEPENDENT_AMBULATORY_CARE_PROVIDER_SITE_OTHER): Payer: Medicaid Other | Admitting: Obstetrics & Gynecology

## 2016-05-04 VITALS — BP 113/69 | HR 103 | Wt 155.0 lb

## 2016-05-04 DIAGNOSIS — Z3490 Encounter for supervision of normal pregnancy, unspecified, unspecified trimester: Secondary | ICD-10-CM

## 2016-05-04 DIAGNOSIS — Z3493 Encounter for supervision of normal pregnancy, unspecified, third trimester: Secondary | ICD-10-CM

## 2016-05-04 MED ORDER — CYCLOBENZAPRINE HCL 5 MG PO TABS: 5.0000 mg | ORAL_TABLET | Freq: Three times a day (TID) | ORAL | 1 refills | Status: DC | PRN

## 2016-05-04 NOTE — Progress Notes (Signed)
Patient reports an increase in discharge- yellowish in color. Patient is having back pain and pressure in lower abdomen- constant- notices more in the morning

## 2016-05-04 NOTE — Progress Notes (Signed)
   PRENATAL VISIT NOTE  Subjective:  Anne Baxter is a 25 y.o. G3P1011 at 5464w0d being seen today for ongoing prenatal care.  She is currently monitored for the following issues for this low-risk pregnancy and has Supervision of low-risk pregnancy and History of migraine headaches on her problem list.  Patient reports backache and on right side.  Contractions: Irregular. Vag. Bleeding: None.  Movement: Present. Denies leaking of fluid.   The following portions of the patient's history were reviewed and updated as appropriate: allergies, current medications, past family history, past medical history, past social history, past surgical history and problem list. Problem list updated.  Objective:   Vitals:   05/04/16 1550  BP: 113/69  Pulse: (!) 103  Weight: 155 lb (70.3 kg)    Fetal Status: Fetal Heart Rate (bpm): 141   Movement: Present     General:  Alert, oriented and cooperative. Patient is in no acute distress.  Skin: Skin is warm and dry. No rash noted.   Cardiovascular: Normal heart rate noted  Respiratory: Normal respiratory effort, no problems with respiration noted  Abdomen: Soft, gravid, appropriate for gestational age. Pain/Pressure: Present     Pelvic:  Cervical exam deferred        Extremities: Normal range of motion.  Edema: None  Mental Status: Normal mood and affect. Normal behavior. Normal judgment and thought content.   Assessment and Plan:  Pregnancy: G3P1011 at 1664w0d  1. Encounter for supervision of low-risk pregnancy, antepartum No CVAT and urine clear, suspect MS right back pain - cyclobenzaprine (FLEXERIL) 5 MG tablet; Take 1 tablet (5 mg total) by mouth 3 (three) times daily as needed for muscle spasms.  Dispense: 20 tablet; Refill: 1  Preterm labor symptoms and general obstetric precautions including but not limited to vaginal bleeding, contractions, leaking of fluid and fetal movement were reviewed in detail with the patient. Please refer to After Visit  Summary for other counseling recommendations.  Return in about 2 weeks (around 05/18/2016).   Adam PhenixJames G Jaidalyn Schillo, MD

## 2016-05-04 NOTE — Patient Instructions (Signed)
Back Pain in Pregnancy Introduction Back pain during pregnancy is common. Back pain may be caused by several factors that are related to changes during your pregnancy. Follow these instructions at home: Managing pain, stiffness, and swelling  If directed, apply ice for sudden (acute) back pain.  Put ice in a plastic bag.  Place a towel between your skin and the bag.  Leave the ice on for 20 minutes, 2-3 times per day.  If directed, apply heat to the affected area before you exercise:  Place a towel between your skin and the heat pack or heating pad.  Leave the heat on for 20-30 minutes.  Remove the heat if your skin turns bright red. This is especially important if you are unable to feel pain, heat, or cold. You may have a greater risk of getting burned. Activity  Exercise as told by your health care provider. Exercising is the best way to prevent or manage back pain.  Listen to your body when lifting. If lifting hurts, ask for help or bend your knees. This uses your leg muscles instead of your back muscles.  Squat down when picking up something from the floor. Do not bend over.  Only use bed rest as told by your health care provider. Bed rest should only be used for the most severe episodes of back pain. Standing, Sitting, and Lying Down  Do not stand in one place for long periods of time.  Use good posture when sitting. Make sure your head rests over your shoulders and is not hanging forward. Use a pillow on your lower back if necessary.  Try sleeping on your side, preferably the left side, with a pillow or two between your legs. If you are sore after a night's rest, your bed may be too soft. A firm mattress may provide more support for your back during pregnancy. General instructions  Do not wear high heels.  Eat a healthy diet. Try to gain weight within your health care provider's recommendations.  Use a maternity girdle, elastic sling, or back brace as told by your  health care provider.  Take over-the-counter and prescription medicines only as told by your health care provider.  Keep all follow-up visits as told by your health care provider. This is important. This includes any visits with any specialists, such as a physical therapist. Contact a health care provider if:  Your back pain interferes with your daily activities.  You have increasing pain in other parts of your body. Get help right away if:  You develop numbness, tingling, weakness, or problems with the use of your arms or legs.  You develop severe back pain that is not controlled with medicine.  You have a sudden change in bowel or bladder control.  You develop shortness of breath, dizziness, or you faint.  You develop nausea, vomiting, or sweating.  You have back pain that is a rhythmic, cramping pain similar to labor pains. Labor pain is usually 1-2 minutes apart, lasts for about 1 minute, and involves a bearing down feeling or pressure in your pelvis.  You have back pain and your water breaks or you have vaginal bleeding.  You have back pain or numbness that travels down your leg.  Your back pain developed after you fell.  You develop pain on one side of your back.  You see blood in your urine.  You develop skin blisters in the area of your back pain. This information is not intended to replace advice given to   you by your health care provider. Make sure you discuss any questions you have with your health care provider. Document Released: 08/17/2005 Document Revised: 10/15/2015 Document Reviewed: 01/21/2015  2017 Elsevier  

## 2016-05-18 ENCOUNTER — Ambulatory Visit (INDEPENDENT_AMBULATORY_CARE_PROVIDER_SITE_OTHER): Payer: Medicaid Other | Admitting: Certified Nurse Midwife

## 2016-05-18 VITALS — BP 116/73 | HR 99 | Wt 158.0 lb

## 2016-05-18 DIAGNOSIS — Z23 Encounter for immunization: Secondary | ICD-10-CM | POA: Diagnosis not present

## 2016-05-18 DIAGNOSIS — Z3493 Encounter for supervision of normal pregnancy, unspecified, third trimester: Secondary | ICD-10-CM

## 2016-05-18 NOTE — Progress Notes (Signed)
Pt would like Tdap today °

## 2016-05-18 NOTE — Progress Notes (Signed)
Subjective:    Anne Baxter is a 25 y.o. female being seen today for her obstetrical visit. She is at 4239w0d gestation. Patient reports no complaints. Fetal movement: normal.  Problem List Items Addressed This Visit      Other   Supervision of low-risk pregnancy - Primary   Relevant Orders   Strep Gp B NAA   NuSwab VG+, Candida 6sp   Tdap vaccine greater than or equal to 25yo IM     Patient Active Problem List   Diagnosis Date Noted  . History of migraine headaches 03/16/2016  . Supervision of low-risk pregnancy 12/14/2015   Objective:    BP 116/73   Pulse 99   Wt 158 lb (71.7 kg)   LMP 09/16/2015   BMI 25.50 kg/m  FHT:  143 BPM  Uterine Size: 35 cm and size equals dates  Presentation: cephalic   Cervix: long, thick, closed and posterior.   Assessment:    Pregnancy @ 6639w0d weeks   Lumbardosis of pregnancy  Plan:    Rx: abdominal support belt given.    labs reviewed, problem list updated Consent signed for TDaP GBS sent TDAP offered & given  Pediatrician: discussed., Dr. Donnie Coffinubin Infant feeding: plans to breastfeed, plans to bottle feed. Maternity leave: discussed. Cigarette smoking: never smoked. Orders Placed This Encounter  Procedures  . Strep Gp B NAA  . Tdap vaccine greater than or equal to 25yo IM   No orders of the defined types were placed in this encounter.  Follow up in 1 Week.

## 2016-05-20 LAB — STREP GP B NAA: Strep Gp B NAA: NEGATIVE

## 2016-05-21 LAB — NUSWAB VG+, CANDIDA 6SP
Atopobium vaginae: HIGH Score — AB
Candida albicans, NAA: NEGATIVE
Candida glabrata, NAA: NEGATIVE
Candida krusei, NAA: NEGATIVE
Candida lusitaniae, NAA: NEGATIVE
Candida parapsilosis, NAA: NEGATIVE
Candida tropicalis, NAA: NEGATIVE
Chlamydia trachomatis, NAA: NEGATIVE
Megasphaera 1: HIGH Score — AB
Neisseria gonorrhoeae, NAA: NEGATIVE
Trich vag by NAA: NEGATIVE

## 2016-05-23 NOTE — L&D Delivery Note (Signed)
Delivery Note At 2:09 AM a viable female was delivered via Vaginal, Spontaneous Delivery (Presentation: vertex; LOA ).  APGAR: 8, 9; weight pending .   Placenta status: spontaneous ,intact .  Cord: 3 vessel with the following complications: none .  Cord pH: not collected  Anesthesia: None Episiotomy: None Lacerations: None Suture Repair: n/a Est. Blood Loss (mL): 250  Mom to postpartum.  Baby to Couplet care / Skin to Skin.  Beaulah DinningChristina M Gambino 06/01/2016, 2:24 AM   OB FELLOW DELIVERY ATTESTATION  I was gloved and present for the delivery in its entirety, and I agree with the above resident's note.    Jen MowElizabeth Naja Apperson, DO OB Fellow 4:17 AM

## 2016-05-24 DIAGNOSIS — Z3482 Encounter for supervision of other normal pregnancy, second trimester: Secondary | ICD-10-CM

## 2016-05-25 ENCOUNTER — Ambulatory Visit (INDEPENDENT_AMBULATORY_CARE_PROVIDER_SITE_OTHER): Payer: Medicaid Other | Admitting: Obstetrics and Gynecology

## 2016-05-25 VITALS — BP 114/74 | HR 103 | Wt 160.0 lb

## 2016-05-25 DIAGNOSIS — Z3483 Encounter for supervision of other normal pregnancy, third trimester: Secondary | ICD-10-CM

## 2016-05-25 DIAGNOSIS — Z3493 Encounter for supervision of normal pregnancy, unspecified, third trimester: Secondary | ICD-10-CM

## 2016-05-25 MED ORDER — ABDOMINAL BINDER/ELASTIC MED MISC: 0 refills | Status: DC

## 2016-05-25 NOTE — Progress Notes (Signed)
   PRENATAL VISIT NOTE  Subjective:  Anne Baxter is a [age] y.o. G3P1011 at 1032w0d being seen today for ongoing prenatal care.  She is currently monitored for the following issues for this low-risk pregnancy and has Supervision of low-risk pregnancy and History of migraine headaches on her problem list.  Patient reports no complaints.  Contractions: Not present. Vag. Bleeding: None.  Movement: Present. Denies leaking of fluid.   The following portions of the patient's history were reviewed and updated as appropriate: allergies, current medications, past family history, past medical history, past social history, past surgical history and problem list. Problem list updated.  Objective:   Vitals:   05/25/16 1551  BP: 114/74  Pulse: (!) 103  Weight: 160 lb (72.6 kg)    Fetal Status: Fetal Heart Rate (bpm): 142 Fundal Height: 36 cm Movement: Present     General:  Alert, oriented and cooperative. Patient is in no acute distress.  Skin: Skin is warm and dry. No rash noted.   Cardiovascular: Normal heart rate noted  Respiratory: Normal respiratory effort, no problems with respiration noted  Abdomen: Soft, gravid, appropriate for gestational age. Pain/Pressure: Absent     Pelvic:  Cervical exam deferred        Extremities: Normal range of motion.  Edema: None  Mental Status: Normal mood and affect. Normal behavior. Normal judgment and thought content.   Assessment and Plan:  Pregnancy: G3P1011 at 952w0d  1. Encounter for supervision of low-risk pregnancy in third trimester Patient is doing well without complaints.  rx for support belt provided as patient lost her other Reviewed culture results with the patient  Preterm labor symptoms and general obstetric precautions including but not limited to vaginal bleeding, contractions, leaking of fluid and fetal movement were reviewed in detail with the patient. Please refer to After Visit Summary for other counseling recommendations.  Return in  about 1 week (around 06/01/2016).   Catalina AntiguaPeggy Mackie Goon, MD

## 2016-05-26 ENCOUNTER — Other Ambulatory Visit: Payer: Self-pay | Admitting: Certified Nurse Midwife

## 2016-05-26 DIAGNOSIS — N760 Acute vaginitis: Principal | ICD-10-CM

## 2016-05-26 DIAGNOSIS — B9689 Other specified bacterial agents as the cause of diseases classified elsewhere: Secondary | ICD-10-CM

## 2016-05-26 MED ORDER — METRONIDAZOLE 500 MG PO TABS: 500.0000 mg | ORAL_TABLET | Freq: Two times a day (BID) | ORAL | 0 refills | Status: DC

## 2016-05-31 ENCOUNTER — Encounter (HOSPITAL_COMMUNITY): Payer: Self-pay

## 2016-05-31 ENCOUNTER — Inpatient Hospital Stay (HOSPITAL_COMMUNITY)
Admission: AD | Admit: 2016-05-31 | Discharge: 2016-06-02 | DRG: 775 | Disposition: A | Discharge status: Home / Self Care | Payer: Medicaid Other | Source: Ambulatory Visit | Attending: Obstetrics & Gynecology | Admitting: Obstetrics & Gynecology

## 2016-05-31 DIAGNOSIS — Z3A37 37 weeks gestation of pregnancy: Secondary | ICD-10-CM

## 2016-05-31 DIAGNOSIS — O9081 Anemia of the puerperium: Principal | ICD-10-CM | POA: Diagnosis present

## 2016-05-31 DIAGNOSIS — Z8249 Family history of ischemic heart disease and other diseases of the circulatory system: Secondary | ICD-10-CM

## 2016-05-31 DIAGNOSIS — D649 Anemia, unspecified: Secondary | ICD-10-CM | POA: Diagnosis present

## 2016-05-31 NOTE — MAU Note (Signed)
Pt presents with complaint of contractions. Denies bleeding or ROM 

## 2016-06-01 ENCOUNTER — Encounter: Payer: Medicaid Other | Admitting: Obstetrics & Gynecology

## 2016-06-01 ENCOUNTER — Encounter (HOSPITAL_COMMUNITY): Payer: Self-pay | Admitting: Anesthesiology

## 2016-06-01 DIAGNOSIS — O9081 Anemia of the puerperium: Secondary | ICD-10-CM | POA: Diagnosis present

## 2016-06-01 DIAGNOSIS — Z3493 Encounter for supervision of normal pregnancy, unspecified, third trimester: Secondary | ICD-10-CM | POA: Diagnosis present

## 2016-06-01 DIAGNOSIS — Z8249 Family history of ischemic heart disease and other diseases of the circulatory system: Secondary | ICD-10-CM | POA: Diagnosis not present

## 2016-06-01 DIAGNOSIS — D649 Anemia, unspecified: Secondary | ICD-10-CM | POA: Diagnosis present

## 2016-06-01 DIAGNOSIS — Z3A37 37 weeks gestation of pregnancy: Secondary | ICD-10-CM

## 2016-06-01 LAB — CBC
HCT: 27.3 % — ABNORMAL LOW (ref 36.0–46.0)
Hemoglobin: 8.7 g/dL — ABNORMAL LOW (ref 12.0–15.0)
MCH: 23.1 pg — ABNORMAL LOW (ref 26.0–34.0)
MCHC: 31.9 g/dL (ref 30.0–36.0)
MCV: 72.4 fL — ABNORMAL LOW (ref 78.0–100.0)
Platelets: 274 10*3/uL (ref 150–400)
RBC: 3.77 MIL/uL — ABNORMAL LOW (ref 3.87–5.11)
RDW: 14.9 % (ref 11.5–15.5)
WBC: 7.1 10*3/uL (ref 4.0–10.5)

## 2016-06-01 LAB — TYPE AND SCREEN
ABO/RH(D): A POS
Antibody Screen: NEGATIVE

## 2016-06-01 LAB — ABO/RH: ABO/RH(D): A POS

## 2016-06-01 MED ORDER — LIDOCAINE HCL (PF) 1 % IJ SOLN
30.0000 mL | INTRAMUSCULAR | Status: DC | PRN
  Filled 2016-06-01: qty 30

## 2016-06-01 MED ORDER — SOD CITRATE-CITRIC ACID 500-334 MG/5ML PO SOLN: 30.0000 mL | ORAL | Status: DC | PRN

## 2016-06-01 MED ORDER — PHENYLEPHRINE 40 MCG/ML (10ML) SYRINGE FOR IV PUSH (FOR BLOOD PRESSURE SUPPORT)
80.0000 ug | PREFILLED_SYRINGE | INTRAVENOUS | Status: DC | PRN
  Filled 2016-06-01: qty 10
  Filled 2016-06-01: qty 5

## 2016-06-01 MED ORDER — OXYCODONE-ACETAMINOPHEN 5-325 MG PO TABS: 2.0000 | ORAL_TABLET | ORAL | Status: DC | PRN

## 2016-06-01 MED ORDER — FENTANYL 2.5 MCG/ML BUPIVACAINE 1/10 % EPIDURAL INFUSION (WH - ANES)
14.0000 mL/h | INTRAMUSCULAR | Status: DC | PRN
  Filled 2016-06-01: qty 100

## 2016-06-01 MED ORDER — PRENATAL MULTIVITAMIN CH
1.0000 | ORAL_TABLET | Freq: Every day | ORAL | Status: DC
  Administered 2016-06-01 – 2016-06-02 (×2): 1 via ORAL
  Filled 2016-06-01 (×2): qty 1

## 2016-06-01 MED ORDER — OXYTOCIN BOLUS FROM INFUSION
500.0000 mL | Freq: Once | INTRAVENOUS | Status: AC
  Administered 2016-06-01: 500 mL via INTRAVENOUS

## 2016-06-01 MED ORDER — ONDANSETRON HCL 4 MG PO TABS: 4.0000 mg | ORAL_TABLET | ORAL | Status: DC | PRN

## 2016-06-01 MED ORDER — ACETAMINOPHEN 325 MG PO TABS
650.0000 mg | ORAL_TABLET | ORAL | Status: DC | PRN
  Administered 2016-06-01: 650 mg via ORAL
  Filled 2016-06-01: qty 2

## 2016-06-01 MED ORDER — SIMETHICONE 80 MG PO CHEW: 80.0000 mg | CHEWABLE_TABLET | ORAL | Status: DC | PRN

## 2016-06-01 MED ORDER — LACTATED RINGERS IV SOLN: INTRAVENOUS | Status: DC

## 2016-06-01 MED ORDER — ACETAMINOPHEN 325 MG PO TABS: 650.0000 mg | ORAL_TABLET | ORAL | Status: DC | PRN

## 2016-06-01 MED ORDER — OXYCODONE-ACETAMINOPHEN 5-325 MG PO TABS: 1.0000 | ORAL_TABLET | ORAL | Status: DC | PRN

## 2016-06-01 MED ORDER — WITCH HAZEL-GLYCERIN EX PADS: 1.0000 "application " | MEDICATED_PAD | CUTANEOUS | Status: DC | PRN

## 2016-06-01 MED ORDER — PHENYLEPHRINE 40 MCG/ML (10ML) SYRINGE FOR IV PUSH (FOR BLOOD PRESSURE SUPPORT)
80.0000 ug | PREFILLED_SYRINGE | INTRAVENOUS | Status: DC | PRN
Start: 2016-06-01 — End: 2016-06-01
  Filled 2016-06-01: qty 5

## 2016-06-01 MED ORDER — ONDANSETRON HCL 4 MG/2ML IJ SOLN: 4.0000 mg | INTRAMUSCULAR | Status: DC | PRN

## 2016-06-01 MED ORDER — DIPHENHYDRAMINE HCL 25 MG PO CAPS: 25.0000 mg | ORAL_CAPSULE | Freq: Four times a day (QID) | ORAL | Status: DC | PRN

## 2016-06-01 MED ORDER — TETANUS-DIPHTH-ACELL PERTUSSIS 5-2.5-18.5 LF-MCG/0.5 IM SUSP: 0.5000 mL | Freq: Once | INTRAMUSCULAR | Status: DC

## 2016-06-01 MED ORDER — DIPHENHYDRAMINE HCL 50 MG/ML IJ SOLN: 12.5000 mg | INTRAMUSCULAR | Status: DC | PRN

## 2016-06-01 MED ORDER — DIBUCAINE 1 % RE OINT: 1.0000 "application " | TOPICAL_OINTMENT | RECTAL | Status: DC | PRN

## 2016-06-01 MED ORDER — COCONUT OIL OIL: 1.0000 "application " | TOPICAL_OIL | Status: DC | PRN

## 2016-06-01 MED ORDER — BENZOCAINE-MENTHOL 20-0.5 % EX AERO
1.0000 "application " | INHALATION_SPRAY | CUTANEOUS | Status: DC | PRN
  Filled 2016-06-01: qty 56

## 2016-06-01 MED ORDER — ONDANSETRON HCL 4 MG/2ML IJ SOLN: 4.0000 mg | Freq: Four times a day (QID) | INTRAMUSCULAR | Status: DC | PRN

## 2016-06-01 MED ORDER — ZOLPIDEM TARTRATE 5 MG PO TABS: 5.0000 mg | ORAL_TABLET | Freq: Every evening | ORAL | Status: DC | PRN

## 2016-06-01 MED ORDER — LACTATED RINGERS IV SOLN: 500.0000 mL | INTRAVENOUS | Status: DC | PRN

## 2016-06-01 MED ORDER — EPHEDRINE 5 MG/ML INJ
10.0000 mg | INTRAVENOUS | Status: DC | PRN
  Filled 2016-06-01: qty 4

## 2016-06-01 MED ORDER — FENTANYL CITRATE (PF) 100 MCG/2ML IJ SOLN: 50.0000 ug | INTRAMUSCULAR | Status: DC | PRN

## 2016-06-01 MED ORDER — OXYTOCIN 40 UNITS IN LACTATED RINGERS INFUSION - SIMPLE MED
2.5000 [IU]/h | INTRAVENOUS | Status: DC
  Filled 2016-06-01: qty 1000

## 2016-06-01 MED ORDER — LACTATED RINGERS IV SOLN: 500.0000 mL | Freq: Once | INTRAVENOUS | Status: DC

## 2016-06-01 MED ORDER — IBUPROFEN 600 MG PO TABS
600.0000 mg | ORAL_TABLET | Freq: Four times a day (QID) | ORAL | Status: DC
  Administered 2016-06-01 – 2016-06-02 (×6): 600 mg via ORAL
  Filled 2016-06-01 (×6): qty 1

## 2016-06-01 MED ORDER — SENNOSIDES-DOCUSATE SODIUM 8.6-50 MG PO TABS
2.0000 | ORAL_TABLET | ORAL | Status: DC
  Administered 2016-06-01: 2 via ORAL
  Filled 2016-06-01: qty 2

## 2016-06-01 MED ORDER — FLEET ENEMA 7-19 GM/118ML RE ENEM: 1.0000 | ENEMA | Freq: Every day | RECTAL | Status: DC | PRN

## 2016-06-01 NOTE — H&P (Signed)
LABOR ADMISSION HISTORY AND PHYSICAL  Anne Baxter is a 26 y.o. female G3P1011 with IUP at [redacted]w[redacted]d by LMP and 7 wk U/S presenting for SOL. She reports +FM, + contractions since 2000 tonight, No LOF, no VB, no blurry vision, headaches or peripheral edema, and RUQ pain.  She plans on breast and bottle feeding. She request a tubal for birth control.  Dating: By LMP and 7 wk U/S --->  Estimated Date of Delivery: 06/22/16  Sono:  @[redacted]w[redacted]d , CWD, normal anatomy, cephalic presentation, placenta posterior, 326g, 53% EFW   Prenatal History/Complications: None  Past Medical History: Past Medical History:  Diagnosis Date  . Anemia 2010   FE  . Headache   . Infection 02/2017   CHLAMYDIA  . Pregnant   . Syncope   . Wrist fracture 2009 X2    Past Surgical History: Past Surgical History:  Procedure Laterality Date  . NO PAST SURGERIES      Obstetrical History: OB History    Gravida Para Term Preterm AB Living   3 1 1  0 1 1   SAB TAB Ectopic Multiple Live Births   0 1 0 0 1      Social History: Social History   Social History  . Marital status: Single    Spouse name: N/A  . Number of children: 1  . Years of education: 77   Occupational History  . 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 Smoking status: Never Smoker  . Smokeless tobacco: Never Used  . Alcohol use No     Comment: RARELY  . Drug use: No  . Sexual activity: Yes    Partners: Male    Birth control/ protection: None   Other Topics Concern  . None   Social History Narrative   Lives at home with boyfriend and daughter.   Right-handed.   No caffeine use.    Family History: Family History  Problem Relation Age of Onset  . Kidney disease Mother     FAILURE  . Heart disease Mother     CHF  . Early death Mother 67  . Hypertension Mother   . Thrombophlebitis Mother   . Hyperlipidemia Maternal Grandmother   . Hypertension Maternal Grandmother     Allergies: No Known  Allergies  Prescriptions Prior to Admission  Medication Sig Dispense Refill Last Dose  . acetaminophen (TYLENOL) 325 MG tablet Take 650 mg by mouth every 6 (six) hours as needed.   Taking  . cyclobenzaprine (FLEXERIL) 5 MG tablet Take 1 tablet (5 mg total) by mouth 3 (three) times daily as needed for muscle spasms. 20 tablet 1   . Elastic Bandages & Supports (ABDOMINAL BINDER/ELASTIC MED) MISC Apply as needed when ambulating 1 each 0   . metroNIDAZOLE (FLAGYL) 500 MG tablet Take 1 tablet (500 mg total) by mouth 2 (two) times daily. 14 tablet 0   . Prenatal Vit-Fe Fumarate-FA (PREPLUS) 27-1 MG TABS Take 1 tablet by mouth daily. 30 tablet 13 Taking  . propranolol ER (INDERAL LA) 80 MG 24 hr capsule Take 1 capsule (80 mg total) by mouth daily. (Patient not taking: Reported on 05/04/2016) 30 capsule 0 Not Taking     Review of Systems   All systems reviewed and negative except as stated in HPI  BP 110/66 (BP Location: Left Arm)   Pulse 93   Temp 98.6 F (37 C) (Oral)   Resp 20   Ht 5\' 6"  (1.676 m)   Wt  73.5 kg (162 lb)   LMP 09/16/2015   BMI 26.15 kg/m  General appearance: alert, cooperative, no distress and uncomfortable Lungs: normal work of breathing Heart: regular rate and rhythm Abdomen: soft, non-tender; bowel sounds normal Extremities: Homans sign is negative, no sign of DVT, edema DTR's normal Presentation: cephalic Fetal monitoringBaseline: 140 bpm, Variability: Good {> 6 bpm), Accelerations: Reactive and Decelerations: Absent Uterine activityFrequency: Every 2-5 minutes Dilation: 4 Effacement (%): 90 Station: -1 Exam by:: Ma Hillock. Neill RN   Prenatal labs: ABO, Rh: A/Positive/-- (07/24 1142) Antibody: Negative (07/24 1142) Rubella: Immune RPR: Non Reactive (10/25 0145)  HBsAg: Negative (07/24 1142)  HIV: Non Reactive (10/25 0145)  GBS: Negative (12/27 1635)  2 hr Glucola normal Genetic screening: Quad negative Anatomy US: normal  Prenatal Transfer Tool  Maternal  Diabetes: No Genetic Screening: Normal Quad Maternal Ultrasounds/Referrals: Normal Fetal Ultrasounds or other Referrals:  None Maternal Substance Abuse:  No Significant Maternal Medications:  None Significant Maternal Lab Results: Lab values include: Group B Strep negative  No results found for this or any previous visit (from the past 24 hour(s)).  Patient Active Problem List   Diagnosis Date Noted  . History of migraine headaches 03/16/2016  . Supervision of low-risk pregnancy 12/14/2015    Assessment: Anne Baxter is a 26 y.o. G3P1011 at 10657w0d here for SOL  #Labor: Progressing normally, will hold off on augmentation at this time #Pain: IV pain medications, epidural upon patient request #FWB: Cat 1  #ID: GBS neg #MOF: Breast and bottle #MOC: states she wants tubal but "won't get it soon" #Circ: yes, outpatient  Anders Simmondshristina Gambino, MD [facility], PGY-2   OB FELLOW HISTORY AND PHYSICAL ATTESTATION  I have seen and examined this patient; I agree with above documentation in the resident's note.    Jen MowElizabeth Amulya Quintin, DO OB Fellow 06/01/2016, 12:56 AM

## 2016-06-01 NOTE — Anesthesia Preprocedure Evaluation (Deleted)
Anesthesia Evaluation  Patient identified by MRN, date of birth, ID band Patient awake    Reviewed: Allergy & Precautions, Patient's Chart, lab work & pertinent test results, reviewed documented beta blocker date and time   Airway Mallampati: II  TM Distance: >3 FB Neck ROM: Full    Dental no notable dental hx. (+) Teeth Intact   Pulmonary neg pulmonary ROS,    Pulmonary exam normal breath sounds clear to auscultation       Cardiovascular hypertension, Pt. on medications and Pt. on home beta blockers Normal cardiovascular exam Rhythm:Regular Rate:Normal     Neuro/Psych  Headaches, negative psych ROS   GI/Hepatic negative GI ROS, Neg liver ROS,   Endo/Other  negative endocrine ROS  Renal/GU negative Renal ROS  negative genitourinary   Musculoskeletal negative musculoskeletal ROS (+)   Abdominal   Peds  Hematology  (+) anemia ,   Anesthesia Other Findings   Reproductive/Obstetrics (+) Pregnancy                             Anesthesia Physical Anesthesia Plan  ASA: II  Anesthesia Plan: Epidural   Post-op Pain Management:    Induction:   Airway Management Planned: Natural Airway  Additional Equipment:   Intra-op Plan:   Post-operative Plan:   Informed Consent: I have reviewed the patients History and Physical, chart, labs and discussed the procedure including the risks, benefits and alternatives for the proposed anesthesia with the patient or authorized representative who has indicated his/her understanding and acceptance.     Plan Discussed with: Anesthesiologist  Anesthesia Plan Comments:         Anesthesia Quick Evaluation

## 2016-06-01 NOTE — Progress Notes (Signed)
UR chart review completed.  

## 2016-06-02 LAB — RPR: RPR Ser Ql: NONREACTIVE

## 2016-06-02 MED ORDER — OXYCODONE-ACETAMINOPHEN 5-325 MG PO TABS: 1.0000 | ORAL_TABLET | ORAL | 0 refills | Status: DC | PRN

## 2016-06-02 MED ORDER — FERROUS SULFATE 325 (65 FE) MG PO TABS: 325.0000 mg | ORAL_TABLET | Freq: Three times a day (TID) | ORAL | 3 refills | Status: DC

## 2016-06-02 MED ORDER — IBUPROFEN 600 MG PO TABS: 600.0000 mg | ORAL_TABLET | Freq: Four times a day (QID) | ORAL | 2 refills | Status: DC

## 2016-06-02 MED ORDER — SENNOSIDES-DOCUSATE SODIUM 8.6-50 MG PO TABS: 2.0000 | ORAL_TABLET | ORAL | 2 refills | Status: DC

## 2016-06-02 MED ORDER — FERROUS SULFATE 325 (65 FE) MG PO TABS
325.0000 mg | ORAL_TABLET | Freq: Three times a day (TID) | ORAL | Status: DC
  Administered 2016-06-02: 325 mg via ORAL
  Filled 2016-06-02: qty 1

## 2016-06-02 NOTE — Discharge Summary (Signed)
Obstetric Discharge Summary Reason for Admission: onset of labor and at 6453w0d Prenatal Procedures: ultrasound Intrapartum Procedures: spontaneous vaginal delivery Postpartum Procedures: none Complications-Operative and Postpartum: none Hemoglobin  Date Value Ref Range Status  06/01/2016 8.7 (L) 12.0 - 15.0 g/dL Final   HCT  Date Value Ref Range Status  06/01/2016 27.3 (L) 36.0 - 46.0 % Final   Hematocrit  Date Value Ref Range Status  03/16/2016 28.9 (L) 34.0 - 46.6 % Final    Physical Exam:  General: alert, cooperative and no distress Lochia: appropriate Uterine Fundus: firm Incision: none DVT Evaluation: No evidence of DVT seen on physical exam. No cords or calf tenderness. No significant calf/ankle edema.  Discharge Diagnoses: Term Pregnancy-delivered  Discharge Information: Date: 06/02/2016 Activity: pelvic rest Diet: routine Medications: PNV, Ibuprofen, Colace, Iron and Percocet Condition: stable Instructions: refer to practice specific booklet Discharge to: home Follow-up Information    Liisa Picone A Yaacov Koziol, CNM Follow up in 4 week(s).   Specialty:  Certified Nurse Midwife Contact information: [address] [place] [id number] [phone]           Newborn Data: Live born female  Birth Weight: 6 lb 4.5 oz (2849 g) APGAR: 8, 9  Home with mother.  Roe Coombsachelle A Nalaya Wojdyla, CNM 06/02/2016, 7:43 AM

## 2016-06-02 NOTE — Progress Notes (Signed)
Post Partum Day #1 Subjective: up ad lib, voiding, tolerating PO and desires to be discharged home.   Objective: Blood pressure (!) 119/51, pulse 82, temperature 97.9 F (36.6 C), temperature source Oral, resp. rate 18, height 5\' 6"  (1.676 m), weight 162 lb (73.5 kg), last menstrual period 09/16/2015, SpO2 100 %, unknown if currently breastfeeding.  Physical Exam:  General: alert, cooperative and no distress Lochia: appropriate Uterine Fundus: firm Incision: none DVT Evaluation: No evidence of DVT seen on physical exam. No cords or calf tenderness. No significant calf/ankle edema.   Recent Labs  06/01/16 0025  HGB 8.7*  HCT 27.3*    Assessment/Plan: Discharge home and Contraception IUD versus Depo injections.   Anemia: asymptomatic, iron started.    LOS: 1 day   Roe CoombsRachelle A Achilles Baxter, CNM 06/02/2016, 7:48 AM

## 2016-06-29 ENCOUNTER — Ambulatory Visit: Payer: Medicaid Other | Admitting: Obstetrics & Gynecology

## 2016-07-06 ENCOUNTER — Ambulatory Visit: Payer: Medicaid Other | Admitting: Certified Nurse Midwife

## 2016-07-06 ENCOUNTER — Ambulatory Visit: Payer: Medicaid Other

## 2016-07-06 ENCOUNTER — Telehealth: Payer: Self-pay

## 2016-07-06 ENCOUNTER — Other Ambulatory Visit (HOSPITAL_COMMUNITY)
Admission: RE | Admit: 2016-07-06 | Discharge: 2016-07-06 | Disposition: A | Discharge status: Home / Self Care | Payer: Medicaid Other | Source: Ambulatory Visit | Attending: Certified Nurse Midwife | Admitting: Certified Nurse Midwife

## 2016-07-06 ENCOUNTER — Encounter: Payer: Self-pay | Admitting: Certified Nurse Midwife

## 2016-07-06 DIAGNOSIS — B3731 Acute candidiasis of vulva and vagina: Secondary | ICD-10-CM

## 2016-07-06 DIAGNOSIS — B9689 Other specified bacterial agents as the cause of diseases classified elsewhere: Secondary | ICD-10-CM

## 2016-07-06 DIAGNOSIS — B373 Candidiasis of vulva and vagina: Secondary | ICD-10-CM | POA: Insufficient documentation

## 2016-07-06 DIAGNOSIS — N760 Acute vaginitis: Secondary | ICD-10-CM | POA: Diagnosis not present

## 2016-07-06 MED ORDER — TERCONAZOLE 0.8 % VA CREA: 1.0000 | TOPICAL_CREAM | Freq: Every day | VAGINAL | 0 refills | Status: DC

## 2016-07-06 MED ORDER — METRONIDAZOLE 0.75 % VA GEL: 1.0000 | Freq: Two times a day (BID) | VAGINAL | 0 refills | Status: DC

## 2016-07-06 MED ORDER — MEDROXYPROGESTERONE ACETATE 150 MG/ML IM SUSP: 150.0000 mg | INTRAMUSCULAR | 3 refills | Status: DC

## 2016-07-06 MED ORDER — FLUCONAZOLE 200 MG PO TABS: 200.0000 mg | ORAL_TABLET | Freq: Once | ORAL | 0 refills | Status: AC

## 2016-07-06 NOTE — Addendum Note (Signed)
Addended by: Marya LandryFOSTER, SUZANNE D on: 07/06/2016 03:28 PM   Modules accepted: Orders

## 2016-07-06 NOTE — Progress Notes (Signed)
Post Partum Exam  Anne Baxter is a 26 y.o. 663P2012 female who presents for a postpartum visit. She is 4 weeks postpartum following a spontaneous vaginal delivery. I have fully reviewed the prenatal and intrapartum course. The delivery was at 37 gestational weeks.  Anesthesia: none. Postpartum course has been doing well. Baby's course has been doing well. Baby is feeding by bottle - Similac Advance. Bleeding no bleeding. Bowel function is normal. Bladder function is normal. Patient is not sexually active. Contraception method is abstinence. Pt would like to discuss BC, unsure of method.  Postpartum depression screening:neg, score 8. Pt states she feels overwhelmed at times, sad at times.   The following portions of the patient's history were reviewed and updated as appropriate: allergies, current medications, past family history, past medical history, past social history, past surgical history and problem list.  Review of Systems Pertinent items noted in HPI and remainder of comprehensive ROS otherwise negative.    Objective:    BP 116/78 mmHg  Pulse 78  Resp 16  Ht 5\' 5"  (1.651 m)  Wt 211 lb (95.709 kg)  BMI 35.11 kg/m2  Breastfeeding? Yes  General:  alert, cooperative and no distress   Breasts:  inspection negative, no nipple discharge or bleeding, no masses or nodularity palpable  Lungs: clear to auscultation bilaterally  Heart:  regular rate and rhythm, S1, S2 normal, no murmur, click, rub or gallop  Abdomen: soft, non-tender; bowel sounds normal; no masses,  no organomegaly   Vulva:  normal  Vagina: vagina positive for + white chuncky vaginal discharge, gray thin vaginal discharge with malodorous  Cervix:  no cervical motion tenderness  Corpus: normal size, contour, position, consistency, mobility, non-tender  Adnexa:  no mass, fullness, tenderness  Rectal Exam: + stool in vault        Assessment:    Normal 4 week postpartum exam. Pap smear not done at today's visit.   H/O  anemia.   Constipation  BV & yeast vaginitis  Plan:   1. Contraception: Depo-Provera injections ordered 2. Last pap smear 12/14/15.  Educated on remedies for constipation.   3. Follow up in: 6 months for annual exam or as needed.  4. Back to work next week.

## 2016-07-06 NOTE — Telephone Encounter (Signed)
Returned call to advise patient that rx for depo was being sent to pharmacy, no answer, left vm.

## 2016-07-08 LAB — CERVICOVAGINAL ANCILLARY ONLY
Bacterial vaginitis: POSITIVE — AB
Candida vaginitis: NEGATIVE
Chlamydia: NEGATIVE
Neisseria Gonorrhea: NEGATIVE
Trichomonas: NEGATIVE

## 2016-07-12 ENCOUNTER — Other Ambulatory Visit: Payer: Self-pay | Admitting: Certified Nurse Midwife

## 2016-07-12 ENCOUNTER — Ambulatory Visit (INDEPENDENT_AMBULATORY_CARE_PROVIDER_SITE_OTHER): Payer: Medicaid Other

## 2016-07-12 DIAGNOSIS — Z3042 Encounter for surveillance of injectable contraceptive: Secondary | ICD-10-CM | POA: Diagnosis not present

## 2016-07-12 MED ORDER — MEDROXYPROGESTERONE ACETATE 150 MG/ML IM SUSP
150.0000 mg | Freq: Once | INTRAMUSCULAR | Status: AC
  Administered 2016-07-12: 150 mg via INTRAMUSCULAR

## 2016-07-12 NOTE — Progress Notes (Addendum)
Pt presents for 1st Depo injection. Pt started menses today. Depo given R upper outer quad. Next Depo 10/03/16

## 2016-09-20 ENCOUNTER — Ambulatory Visit (INDEPENDENT_AMBULATORY_CARE_PROVIDER_SITE_OTHER): Payer: Medicaid Other | Admitting: Obstetrics and Gynecology

## 2016-09-20 ENCOUNTER — Encounter: Payer: Self-pay | Admitting: Obstetrics and Gynecology

## 2016-09-20 DIAGNOSIS — F324 Major depressive disorder, single episode, in partial remission: Secondary | ICD-10-CM

## 2016-09-20 DIAGNOSIS — F329 Major depressive disorder, single episode, unspecified: Secondary | ICD-10-CM | POA: Insufficient documentation

## 2016-09-20 DIAGNOSIS — F32A Depression, unspecified: Secondary | ICD-10-CM | POA: Insufficient documentation

## 2016-09-20 MED ORDER — SERTRALINE HCL 50 MG PO TABS: 50.0000 mg | ORAL_TABLET | Freq: Every day | ORAL | 5 refills | Status: DC

## 2016-09-20 NOTE — Progress Notes (Signed)
Patient ID: Anne Baxter, female   DOB: 03-22-1991, 26 y.o.   MRN: [id number]   Pt delivered this past Jan. Some mild PP depression at [facility] visit in Feb, thought she could handle it. However Sx have progressed. Problems with mind racing, feeling overwhelmed, crying spells, decreased appetitie and insomnia. She denies any HI/SI   H/O Depression /anxiety back in 2008-09-23 after mother passed away. Took unknown antidepressant and Sx improved. Stopped on her own after 2 yrs.  Lives with FOB who is also FOB of her other child. He has not been very supportive of this last pregnancy.  PE VSS AF Lungs clear  Heart RRR Abd soft + BS  A/P Depression.  Management options reviewed with pt. Will start Zoloft 25 mg x 7 days and then increase to 50 mg. Refer to [facility] for counseling as well. F/U in 4 weeks

## 2016-09-20 NOTE — Progress Notes (Signed)
Patient presents for postpartum depression and anxiety. Postpartum Depression Scale =24 /  GAD-7 =20

## 2016-09-20 NOTE — Patient Instructions (Signed)
Major Depressive Disorder, Adult Major depressive disorder (MDD) is a mental health condition. It may also be called clinical depression or unipolar depression. MDD usually causes feelings of sadness, hopelessness, or helplessness. MDD can also cause physical symptoms. It can interfere with work, school, relationships, and other everyday activities. MDD may be mild, moderate, or severe. It may occur once (single episode major depressive disorder) or it may occur multiple times (recurrent major depressive disorder). What are the causes? The exact cause of this condition is not known. MDD is most likely caused by a combination of things, which may include:  Genetic factors. These are traits that are passed along from parent to child.  Individual factors. Your personality, your behavior, and the way you handle your thoughts and feelings may contribute to MDD. This includes personality traits and behaviors learned from others.  Physical factors, such as: ? Differences in the part of your brain that controls emotion. This part of your brain may be different than it is in people who do not have MDD. ? Long-term (chronic) medical or psychiatric illnesses.  Social factors. Traumatic experiences or major life changes may play a role in the development of MDD.  What increases the risk? This condition is more likely to develop in women. The following factors may also make you more likely to develop MDD:  A family history of depression.  Troubled family relationships.  Abnormally low levels of certain brain chemicals.  Traumatic events in childhood, especially abuse or the loss of a parent.  Being under a lot of stress, or long-term stress, especially from upsetting life experiences or losses.  A history of: ? Chronic physical illness. ? Other mental health disorders. ? Substance abuse.  Poor living conditions.  Experiencing social exclusion or discrimination on a regular basis.  What are  the signs or symptoms? The main symptoms of MDD typically include:  Constant depressed or irritable mood.  Loss of interest in things and activities.  MDD symptoms may also include:  Sleeping or eating too much or too little.  Unexplained weight change.  Fatigue or low energy.  Feelings of worthlessness or guilt.  Difficulty thinking clearly or making decisions.  Thoughts of suicide or of harming others.  Physical agitation or weakness.  Isolation.  Severe cases of MDD may also occur with other symptoms, such as:  Delusions or hallucinations, in which you imagine things that are not real (psychotic depression).  Low-level depression that lasts at least a year (chronic depression or persistent depressive disorder).  Extreme sadness and hopelessness (melancholic depression).  Trouble speaking and moving (catatonic depression).  How is this diagnosed? This condition may be diagnosed based on:  Your symptoms.  Your medical history, including your mental health history. This may involve tests to evaluate your mental health. You may be asked questions about your lifestyle, including any drug and alcohol use, and how long you have had symptoms of MDD.  A physical exam.  Blood tests to rule out other conditions.  You must have a depressed mood and at least four other MDD symptoms most of the day, nearly every day in the same 2-week timeframe before your health care provider can confirm a diagnosis of MDD. How is this treated? This condition is usually treated by mental health professionals, such as psychologists, psychiatrists, and clinical social workers. You may need more than one type of treatment. Treatment may include:  Psychotherapy. This is also called talk therapy or counseling. Types of psychotherapy include: ? Cognitive behavioral   therapy (CBT). This type of therapy teaches you to recognize unhealthy feelings, thoughts, and behaviors, and replace them with  positive thoughts and actions. ? Interpersonal therapy (IPT). This helps you to improve the way you relate to and communicate with others. ? Family therapy. This treatment includes members of your family.  Medicine to treat anxiety and depression, or to help you control certain emotions and behaviors.  Lifestyle changes, such as: ? Limiting alcohol and drug use. ? Exercising regularly. ? Getting plenty of sleep. ? Making healthy eating choices. ? Spending more time outdoors.  Treatments involving stimulation of the brain can be used in situations with extremely severe symptoms, or when medicine or other therapies do not work over time. These treatments include electroconvulsive therapy, transcranial magnetic stimulation, and vagal nerve stimulation. Follow these instructions at home: Activity  Return to your normal activities as told by your health care provider.  Exercise regularly and spend time outdoors as told by your health care provider. General instructions  Take over-the-counter and prescription medicines only as told by your health care provider.  Do not drink alcohol. If you drink alcohol, limit your alcohol intake to no more than 1 drink a day for nonpregnant women and 2 drinks a day for men. One drink equals 12 oz of beer, 5 oz of wine, or 1 oz of hard liquor. Alcohol can affect any antidepressant medicines you are taking. Talk to your health care provider about your alcohol use.  Eat a healthy diet and get plenty of sleep.  Find activities that you enjoy doing, and make time to do them.  Consider joining a support group. Your health care provider may be able to recommend a support group.  Keep all follow-up visits as told by your health care provider. This is important. Where to find more information: National Alliance on Mental Illness  www.nami.org  U.S. National Institute of Mental Health  www.nimh.nih.gov  National Suicide Prevention  Lifeline  1-800-273-TALK (8255). This is free, 24-hour help.  Contact a health care provider if:  Your symptoms get worse.  You develop new symptoms. Get help right away if:  You self-harm.  You have serious thoughts about hurting yourself or others.  You see, hear, taste, smell, or feel things that are not present (hallucinate). This information is not intended to replace advice given to you by your health care provider. Make sure you discuss any questions you have with your health care provider. Document Released: 09/03/2012 Document Revised: 01/14/2016 Document Reviewed: 11/18/2015 Elsevier Interactive Patient Education  2017 Elsevier Inc.  

## 2016-09-27 ENCOUNTER — Institutional Professional Consult (permissible substitution): Payer: Medicaid Other

## 2016-09-27 NOTE — BH Specialist Note (Deleted)
Integrated Behavioral Health Initial Visit  MRN: [id number] Name: Lyndee Leosiah L Hollar   Session Start time: *** Session End time: *** Total time: {IBH Total Time:21014050}  Type of Service: Integrated Behavioral Health- Individual/Family Interpretor:{yes [id number]}no:[id number]} Interpretor Name and Language: ***   Warm Hand Off Completed.       SUBJECTIVE: Jayel L Langer is a 26 y.o. female accompanied by {Persons; PED relatives w/patient:19415}. Patient was referred by Dr Alysia PennaErvin for depression. Patient reports the following symptoms/concerns: *** Duration of problem: ***; Severity of problem: {Mild/Moderate/Severe:20260}  OBJECTIVE: Mood: {BHH MOOD:22306} and Affect: {BHH AFFECT:22307} Risk of harm to self or others: {CHL AMB BH Suicide Current Mental Status:21022748}   LIFE CONTEXT: Family and Social: *** School/Work: *** Self-Care: *** Life Changes: ***  GOALS ADDRESSED: Patient will reduce symptoms of: {IBH Symptoms:21014056} and increase knowledge and/or ability of: {IBH Patient Tools:21014057} and also: {IBH Goals:21014053}   INTERVENTIONS: {IBH Interventions:21014054}  Standardized Assessments completed: {IBH Screening Tools:21014051}  ASSESSMENT: Patient currently experiencing ***. Patient may benefit from ***.  PLAN: 1. Follow up with behavioral health clinician on : *** 2. Behavioral recommendations: *** 3. Referral(s): {IBH Referrals:21014055} 4. "From scale of 1-10, how likely are you to follow plan?": ***  Keller Mikels C Rhett Mutschler, LCSWA

## 2016-10-04 ENCOUNTER — Ambulatory Visit: Payer: Medicaid Other

## 2016-10-07 ENCOUNTER — Telehealth: Payer: Self-pay | Admitting: Clinical

## 2016-10-07 NOTE — Telephone Encounter (Signed)
Left HIPPA-compliant message to call back Anne Baxter from [facility]  at [phone].  

## 2016-10-10 ENCOUNTER — Ambulatory Visit: Payer: Medicaid Other

## 2016-10-18 ENCOUNTER — Ambulatory Visit: Payer: Medicaid Other | Admitting: Obstetrics and Gynecology

## 2017-02-20 DIAGNOSIS — B999 Unspecified infectious disease: Secondary | ICD-10-CM

## 2017-02-20 HISTORY — DX: Unspecified infectious disease: B99.9

## 2017-03-02 ENCOUNTER — Ambulatory Visit (INDEPENDENT_AMBULATORY_CARE_PROVIDER_SITE_OTHER): Payer: Medicaid Other

## 2017-03-02 DIAGNOSIS — Z3202 Encounter for pregnancy test, result negative: Secondary | ICD-10-CM | POA: Diagnosis not present

## 2017-03-02 DIAGNOSIS — N912 Amenorrhea, unspecified: Secondary | ICD-10-CM

## 2017-03-02 LAB — POCT URINE PREGNANCY: Preg Test, Ur: NEGATIVE

## 2017-03-02 NOTE — Progress Notes (Signed)
Pt presents for nurse visit. She requests to restart Depo. TAB Sept 8th per pt. She passed a "painful" blood clot two days ago and will return to [facility] [address] tomorrow for another [facility] per pt. Consulted with Dr. Alysia Penna. Continue with the Depo restart protocol. UPT neg today. Pt will return 2 wks for another UPT and if neg, we will administer Depo. Pt aware to bring Depo to the appt. Urged pt to abstain from IC x 2 wks; she agreed.

## 2017-06-26 ENCOUNTER — Inpatient Hospital Stay (HOSPITAL_COMMUNITY)
Admission: AD | Admit: 2017-06-26 | Discharge: 2017-06-26 | Disposition: A | Discharge status: Home / Self Care | Payer: Medicaid Other | Source: Ambulatory Visit | Attending: Obstetrics & Gynecology | Admitting: Obstetrics & Gynecology

## 2017-06-26 ENCOUNTER — Other Ambulatory Visit: Payer: Self-pay

## 2017-06-26 ENCOUNTER — Encounter (HOSPITAL_COMMUNITY): Payer: Self-pay | Admitting: *Deleted

## 2017-06-26 ENCOUNTER — Inpatient Hospital Stay (HOSPITAL_COMMUNITY): Payer: Medicaid Other

## 2017-06-26 DIAGNOSIS — B9689 Other specified bacterial agents as the cause of diseases classified elsewhere: Secondary | ICD-10-CM | POA: Insufficient documentation

## 2017-06-26 DIAGNOSIS — O209 Hemorrhage in early pregnancy, unspecified: Secondary | ICD-10-CM

## 2017-06-26 DIAGNOSIS — N760 Acute vaginitis: Secondary | ICD-10-CM

## 2017-06-26 DIAGNOSIS — O23591 Infection of other part of genital tract in pregnancy, first trimester: Secondary | ICD-10-CM | POA: Diagnosis not present

## 2017-06-26 DIAGNOSIS — Z3A09 9 weeks gestation of pregnancy: Secondary | ICD-10-CM | POA: Diagnosis not present

## 2017-06-26 DIAGNOSIS — Z3491 Encounter for supervision of normal pregnancy, unspecified, first trimester: Secondary | ICD-10-CM

## 2017-06-26 HISTORY — DX: Depression, unspecified: F32.A

## 2017-06-26 HISTORY — DX: Major depressive disorder, single episode, unspecified: F32.9

## 2017-06-26 LAB — CBC
HCT: 30.4 % — ABNORMAL LOW (ref 36.0–46.0)
Hemoglobin: 9.8 g/dL — ABNORMAL LOW (ref 12.0–15.0)
MCH: 23.7 pg — ABNORMAL LOW (ref 26.0–34.0)
MCHC: 32.2 g/dL (ref 30.0–36.0)
MCV: 73.6 fL — ABNORMAL LOW (ref 78.0–100.0)
Platelets: 207 10*3/uL (ref 150–400)
RBC: 4.13 MIL/uL (ref 3.87–5.11)
RDW: 15.2 % (ref 11.5–15.5)
WBC: 5.8 10*3/uL (ref 4.0–10.5)

## 2017-06-26 LAB — URINALYSIS, ROUTINE W REFLEX MICROSCOPIC
Bilirubin Urine: NEGATIVE
Glucose, UA: NEGATIVE mg/dL
Hgb urine dipstick: NEGATIVE
Ketones, ur: NEGATIVE mg/dL
Leukocytes, UA: NEGATIVE
Nitrite: NEGATIVE
Protein, ur: NEGATIVE mg/dL
Specific Gravity, Urine: 1.024 (ref 1.005–1.030)
pH: 6 (ref 5.0–8.0)

## 2017-06-26 LAB — WET PREP, GENITAL
Sperm: NONE SEEN
Trich, Wet Prep: NONE SEEN
Yeast Wet Prep HPF POC: NONE SEEN

## 2017-06-26 LAB — GC/CHLAMYDIA PROBE AMP (~~LOC~~) NOT AT ARMC
Chlamydia: NEGATIVE
Neisseria Gonorrhea: NEGATIVE

## 2017-06-26 LAB — POCT PREGNANCY, URINE: Preg Test, Ur: POSITIVE — AB

## 2017-06-26 LAB — HIV ANTIBODY (ROUTINE TESTING W REFLEX): HIV Screen 4th Generation wRfx: NONREACTIVE

## 2017-06-26 LAB — HCG, QUANTITATIVE, PREGNANCY: hCG, Beta Chain, Quant, S: 6916 m[IU]/mL — ABNORMAL HIGH (ref <5)

## 2017-06-26 MED ORDER — METRONIDAZOLE 500 MG PO TABS: 500.0000 mg | ORAL_TABLET | Freq: Two times a day (BID) | ORAL | 0 refills | Status: DC

## 2017-06-26 MED ORDER — PRENATAL VITAMINS 0.8 MG PO TABS: 1.0000 | ORAL_TABLET | Freq: Every day | ORAL | 12 refills | Status: DC

## 2017-06-26 MED ORDER — METRONIDAZOLE 0.75 % VA GEL: 1.0000 | Freq: Every day | VAGINAL | 1 refills | Status: DC

## 2017-06-26 NOTE — MAU Provider Note (Signed)
Chief Complaint: Possible Pregnancy and Vaginal Bleeding   None     SUBJECTIVE HPI: Anne Baxter is a 27 y.o. [id number] at [redacted]w[redacted]d by LMP who presents to maternity admissions reporting light bleeding and abdominal cramping on the left side starting today with positive HPT yesterday.  She reports she saw bleeding this morning that was pink, only when wiping. It is unchanged since onset. She has not tried any treatments.  The bleeding is associated with cramping pain, on the left lower side that is intermittent and does not radiate. She has not tried any treatments.  She is sure she had a period in December but unsure if she had any bleeding in January.  There are no other associated symptoms.   HPI  Past Medical History:  Diagnosis Date  . Anemia 2010   FE  . Depression   . Headache   . Infection 02/2017   CHLAMYDIA  . Pregnant   . Syncope   . Wrist fracture 2009 X2   Past Surgical History:  Procedure Laterality Date  . NO PAST SURGERIES     Social History   Socioeconomic History  . Marital status: Single    Spouse name: Not on file  . Number of children: 1  . Years of education: 25  . Highest education level: Not on file  Social Needs  . Financial resource strain: Not on file  . Food insecurity - worry: Not on file  . Food insecurity - inability: Not on file  . Transportation needs - medical: Not on file  . Transportation needs - non-medical: Not on file  Occupational History  . Occupation: CUSTOMER SERVICE    Employer: [employer]  Tobacco Use  . Smoking status: Never Smoker  . Smokeless tobacco: Never Used  Substance and Sexual Activity  . Alcohol use: No    Alcohol/week: 0.0 oz    Comment: RARELY  . Drug use: No  . Sexual activity: Yes    Partners: Male    Birth control/protection: None  Other Topics Concern  . Not on file  Social History Narrative   Lives at home with boyfriend and daughter.   Right-handed.   No caffeine use.   No current  facility-administered medications on file prior to encounter.    No current outpatient medications on file prior to encounter.   No Known Allergies  ROS:  Review of Systems  Constitutional: Negative for chills, fatigue and fever.  Respiratory: Negative for shortness of breath.   Cardiovascular: Negative for chest pain.  Gastrointestinal: Negative for nausea and vomiting.  Genitourinary: Positive for pelvic pain and vaginal bleeding. Negative for difficulty urinating, dysuria, flank pain, vaginal discharge and vaginal pain.  Neurological: Negative for dizziness and headaches.  Psychiatric/Behavioral: Negative.      I have reviewed patient's Past Medical Hx, Surgical Hx, Family Hx, Social Hx, medications and allergies.   Physical Exam   Patient Vitals for the past 24 hrs:  BP Temp Temp src Pulse Resp  06/26/17 0233 124/77 - - 91 18  06/26/17 0032 121/80 97.8 F (36.6 C) Oral 92 16   Constitutional: Well-developed, well-nourished female in no acute distress.  Cardiovascular: normal rate Respiratory: normal effort GI: Abd soft, non-tender. Pos BS x 4 MS: Extremities nontender, no edema, normal ROM Neurologic: Alert and oriented x 4.  GU: Neg CVAT.  PELVIC EXAM: Cervix pink, visually closed, without lesion but friable to cotton swab, no active bleeding noted before cotton swab to cervix, scant white creamy discharge,  vaginal walls and external genitalia normal Bimanual exam: Cervix 0/long/high, firm, anterior, neg CMT, uterus nontender, nonenlarged, adnexa without tenderness, enlargement, or mass   LAB RESULTS Results for orders placed or performed during the hospital encounter of 06/26/17 (from the past 24 hour(s))  Urinalysis, Routine w reflex microscopic     Status: Abnormal   Collection Time: 06/26/17 12:40 AM  Result Value Ref Range   Color, Urine YELLOW YELLOW   APPearance HAZY (A) CLEAR   Specific Gravity, Urine 1.024 1.005 - 1.030   pH 6.0 5.0 - 8.0   Glucose, UA  NEGATIVE NEGATIVE mg/dL   Hgb urine dipstick NEGATIVE NEGATIVE   Bilirubin Urine NEGATIVE NEGATIVE   Ketones, ur NEGATIVE NEGATIVE mg/dL   Protein, ur NEGATIVE NEGATIVE mg/dL   Nitrite NEGATIVE NEGATIVE   Leukocytes, UA NEGATIVE NEGATIVE  Pregnancy, urine POC     Status: Abnormal   Collection Time: 06/26/17 12:46 AM  Result Value Ref Range   Preg Test, Ur POSITIVE (A) NEGATIVE  CBC     Status: Abnormal   Collection Time: 06/26/17  1:20 AM  Result Value Ref Range   WBC 5.8 4.0 - 10.5 K/uL   RBC 4.13 3.87 - 5.11 MIL/uL   Hemoglobin 9.8 (L) 12.0 - 15.0 g/dL   HCT 16.1 (L) 09.6 - 04.5 %   MCV 73.6 (L) 78.0 - 100.0 fL   MCH 23.7 (L) 26.0 - 34.0 pg   MCHC 32.2 30.0 - 36.0 g/dL   RDW 40.9 81.1 - 91.4 %   Platelets 207 150 - 400 K/uL  hCG, quantitative, pregnancy     Status: Abnormal   Collection Time: 06/26/17  1:20 AM  Result Value Ref Range   hCG, Beta Chain, Quant, S 6,916 (H) <5 mIU/mL  Wet prep, genital     Status: Abnormal   Collection Time: 06/26/17  1:39 AM  Result Value Ref Range   Yeast Wet Prep HPF POC NONE SEEN NONE SEEN   Trich, Wet Prep NONE SEEN NONE SEEN   Clue Cells Wet Prep HPF POC PRESENT (A) NONE SEEN   WBC, Wet Prep HPF POC MODERATE (A) NONE SEEN   Sperm NONE SEEN        IMAGING US Ob Comp Less 14 Wks  Result Date: 06/26/2017 CLINICAL DATA:  Acute onset of vaginal bleeding. EXAM: OBSTETRIC <14 WK [place] AND TRANSVAGINAL OB US TECHNIQUE: Both transabdominal and transvaginal ultrasound examinations were performed for complete evaluation of the gestation as well as the maternal uterus, adnexal regions, and pelvic cul-de-sac. Transvaginal technique was performed to assess early pregnancy. COMPARISON:  Chest pelvic ultrasound performed 03/25/2016 FINDINGS: Intrauterine gestational sac: Single; visualized and normal in shape. Yolk sac:  Yes Embryo:  No MSD: 5.4  mm   5 w   2  d Subchorionic hemorrhage:  None visualized. Maternal uterus/adnexae: The uterus is unremarkable  in appearance. The ovaries are within normal limits. The right ovary measures 2.8 x 2.2 x 2.7 cm, while the left ovary measures 2.7 x 1.9 x 1.6 cm. There is no evidence for ovarian torsion. No suspicious adnexal masses are seen. Trace free fluid is seen within the pelvic cul-de-sac. IMPRESSION: Single intrauterine gestational sac noted, with a mean sac diameter of 5 mm, corresponding to a gestational age of [redacted] weeks 2 days. This does not match the gestational age by LMP, though it remains too early to determine a new estimated date of delivery. A yolk sac is visualized. No embryo is yet seen, within  normal limits. If the patient's quantitative beta HCG continues to rise, follow-up pelvic ultrasound could be performed in 2 weeks for further evaluation. Electronically Signed   By: Roanna RaiderJeffery  Chang M.D.   On: 06/26/2017 02:21   [place] Ob Transvaginal  Result Date: 06/26/2017 CLINICAL DATA:  Acute onset of vaginal bleeding. EXAM: OBSTETRIC <14 WK US AND TRANSVAGINAL OB US TECHNIQUE: Both transabdominal and transvaginal ultrasound examinations were performed for complete evaluation of the gestation as well as the maternal uterus, adnexal regions, and pelvic cul-de-sac. Transvaginal technique was performed to assess early pregnancy. COMPARISON:  Chest pelvic ultrasound performed 03/25/2016 FINDINGS: Intrauterine gestational sac: Single; visualized and normal in shape. Yolk sac:  Yes Embryo:  No MSD: 5.4  mm   5 w   2  d Subchorionic hemorrhage:  None visualized. Maternal uterus/adnexae: The uterus is unremarkable in appearance. The ovaries are within normal limits. The right ovary measures 2.8 x 2.2 x 2.7 cm, while the left ovary measures 2.7 x 1.9 x 1.6 cm. There is no evidence for ovarian torsion. No suspicious adnexal masses are seen. Trace free fluid is seen within the pelvic cul-de-sac. IMPRESSION: Single intrauterine gestational sac noted, with a mean sac diameter of 5 mm, corresponding to a gestational age of [redacted] weeks 2  days. This does not match the gestational age by LMP, though it remains too early to determine a new estimated date of delivery. A yolk sac is visualized. No embryo is yet seen, within normal limits. If the patient's quantitative beta HCG continues to rise, follow-up pelvic ultrasound could be performed in 2 weeks for further evaluation. Electronically Signed   By: Roanna RaiderJeffery  Chang M.D.   On: 06/26/2017 02:21    MAU Management/MDM: Ordered labs and US and reviewed results.  IUP noted on today's US.  Wet prep and clinical exam support dx of BV.  Pt prefers Metrogel so Rx sent.  Outpatient US ordered for viability. [facility]  Pt discharged with strict first trimester/bleeding/pain precautions.  ASSESSMENT 1. Normal IUP (intrauterine pregnancy) on prenatal ultrasound, first trimester   2. Vaginal bleeding in pregnancy, first trimester   3. BV (bacterial vaginosis)     PLAN Discharge home Allergies as of 06/26/2017   No Known Allergies     Medication List    STOP taking these medications   medroxyPROGESTERone 150 MG/ML injection Commonly known as:  DEPO-PROVERA   sertraline 50 MG tablet Commonly known as:  ZOLOFT     TAKE these medications   metroNIDAZOLE 500 MG tablet Commonly known as:  FLAGYL Take 1 tablet (500 mg total) by mouth 2 (two) times daily for 7 days.   Prenatal Vitamins 0.8 MG tablet Take 1 tablet by mouth daily.      Follow-up Information    prenatal provider of your choice Follow up.   Why:  See list provided.           Sharen CounterLisa Leftwich-Kirby Certified Nurse-Midwife 06/26/2017  4:31 AM

## 2017-06-26 NOTE — MAU Note (Signed)
Pt states that she woke up at 7 am with some dark bleeding. The bleeding stopped, but then she had some spotting throughout the day. Pt has had pain since 6pm, all over abdomen, but more so on her L side. Pt thinks her last period was in the beginning of December, but unsure if she had a period in January. + UPT yesterday at home.

## 2017-06-26 NOTE — Discharge Instructions (Signed)
[facility]  ° ° °[facility]       Phone: [phone] ° °[facility] Phone: [phone] ° °[facility] [place]  Phone: [phone] ° °[facility] [place]  Phone: [phone] ° °[facility] [place]  Phone: [phone] ° °[facility]       Phone: [phone] ° °[facility]    Phone: [phone]  ° °[facility] ()    Phone: [phone] ° °[facility]    Phone: [phone] ° °[facility]    Phone: [phone] ° °[facility]    Phone: [phone] ° °[facility]       Phone: [phone]  ° °[facility]  Phone: [phone] ° °[facility]    Phone: [phone] ° °[facility]    Phone: [phone] ° °[facility]      Phone: [phone] ° °[facility]    Phone: [phone] ° °

## 2017-07-03 ENCOUNTER — Ambulatory Visit: Payer: Medicaid Other | Admitting: *Deleted

## 2017-07-03 ENCOUNTER — Ambulatory Visit (HOSPITAL_COMMUNITY)
Admission: RE | Admit: 2017-07-03 | Discharge: 2017-07-03 | Disposition: A | Discharge status: Home / Self Care | Payer: Medicaid Other | Source: Ambulatory Visit | Attending: Advanced Practice Midwife | Admitting: Advanced Practice Midwife

## 2017-07-03 DIAGNOSIS — Z3490 Encounter for supervision of normal pregnancy, unspecified, unspecified trimester: Secondary | ICD-10-CM

## 2017-07-03 DIAGNOSIS — N760 Acute vaginitis: Secondary | ICD-10-CM | POA: Diagnosis not present

## 2017-07-03 DIAGNOSIS — O209 Hemorrhage in early pregnancy, unspecified: Secondary | ICD-10-CM | POA: Insufficient documentation

## 2017-07-03 DIAGNOSIS — O98811 Other maternal infectious and parasitic diseases complicating pregnancy, first trimester: Secondary | ICD-10-CM | POA: Diagnosis not present

## 2017-07-03 DIAGNOSIS — B9689 Other specified bacterial agents as the cause of diseases classified elsewhere: Secondary | ICD-10-CM | POA: Insufficient documentation

## 2017-07-03 DIAGNOSIS — Z3491 Encounter for supervision of normal pregnancy, unspecified, first trimester: Secondary | ICD-10-CM | POA: Insufficient documentation

## 2017-07-03 DIAGNOSIS — Z3A01 Less than 8 weeks gestation of pregnancy: Secondary | ICD-10-CM | POA: Diagnosis not present

## 2017-07-03 NOTE — Progress Notes (Signed)
Here for US results, reviewed with Herbert SetaHeather Hogan,CNM and informed patient [place] shows live baby at [id number] and we recommend she start prenatal care. [facility]

## 2017-07-03 NOTE — Progress Notes (Signed)
I have reviewed the nursing note, and agree with the plan.  Thressa ShellerHeather Mickeal Daws 4:29 PM 07/03/17

## 2017-07-06 ENCOUNTER — Telehealth: Payer: Self-pay

## 2017-07-06 NOTE — Telephone Encounter (Signed)
S/w pt and she informed that she previously had been having suicidal thoughts but has been in counseling for the past 2 weeks through her employer. Pt wanted accomodation paperwork to be filled out for the counseling sessions and showing hx of depression. Advised pt that if she is having suicidal thoughts now then she can come into the office today or go to MAU, pt stated that things have been better since counseling.......called pt back and left detailed message with her permission, advising per provider, that paperwork needs to be filled out by current counselor and to let us know if she needs rx before she gets an appt.

## 2017-07-11 NOTE — Telephone Encounter (Signed)
Called pt to inform her of providers comments. Pt number is not a working number at this time.

## 2017-07-18 ENCOUNTER — Telehealth: Payer: Self-pay

## 2017-07-18 NOTE — Telephone Encounter (Signed)
S/w patient and she stated that she did not receive vm that I previously left for her. Advised pt that provider stated that accommodation paperwork should be filled out by current counselor that she is seeing. Pt stated that she does not know if she plans on going forward with the pregnancy, but she will let us know.

## 2017-08-06 ENCOUNTER — Inpatient Hospital Stay (HOSPITAL_COMMUNITY)
Admission: AD | Admit: 2017-08-06 | Discharge: 2017-08-11 | DRG: 832 | Disposition: A | Discharge status: Home / Self Care | Payer: Medicaid Other | Source: Intra-hospital | Attending: Psychiatry | Admitting: Psychiatry

## 2017-08-06 ENCOUNTER — Encounter (HOSPITAL_COMMUNITY): Payer: Self-pay | Admitting: *Deleted

## 2017-08-06 ENCOUNTER — Encounter (HOSPITAL_COMMUNITY): Payer: Self-pay

## 2017-08-06 ENCOUNTER — Inpatient Hospital Stay (HOSPITAL_COMMUNITY)
Admission: AD | Admit: 2017-08-06 | Discharge: 2017-08-06 | Disposition: A | Discharge status: Other Acute Inpatient Hospital | Payer: Medicaid Other | Source: Ambulatory Visit | Attending: Obstetrics & Gynecology | Admitting: Obstetrics & Gynecology

## 2017-08-06 ENCOUNTER — Other Ambulatory Visit: Payer: Self-pay

## 2017-08-06 DIAGNOSIS — G4700 Insomnia, unspecified: Secondary | ICD-10-CM | POA: Diagnosis not present

## 2017-08-06 DIAGNOSIS — R45851 Suicidal ideations: Secondary | ICD-10-CM

## 2017-08-06 DIAGNOSIS — Z3401 Encounter for supervision of normal first pregnancy, first trimester: Secondary | ICD-10-CM | POA: Diagnosis not present

## 2017-08-06 DIAGNOSIS — O99351 Diseases of the nervous system complicating pregnancy, first trimester: Secondary | ICD-10-CM | POA: Diagnosis present

## 2017-08-06 DIAGNOSIS — F332 Major depressive disorder, recurrent severe without psychotic features: Secondary | ICD-10-CM | POA: Diagnosis present

## 2017-08-06 DIAGNOSIS — F32A Depression, unspecified: Secondary | ICD-10-CM

## 2017-08-06 DIAGNOSIS — Z3A11 11 weeks gestation of pregnancy: Secondary | ICD-10-CM

## 2017-08-06 DIAGNOSIS — O99341 Other mental disorders complicating pregnancy, first trimester: Secondary | ICD-10-CM | POA: Diagnosis present

## 2017-08-06 DIAGNOSIS — F419 Anxiety disorder, unspecified: Secondary | ICD-10-CM | POA: Diagnosis not present

## 2017-08-06 DIAGNOSIS — Z6379 Other stressful life events affecting family and household: Secondary | ICD-10-CM | POA: Diagnosis not present

## 2017-08-06 DIAGNOSIS — Z3490 Encounter for supervision of normal pregnancy, unspecified, unspecified trimester: Secondary | ICD-10-CM | POA: Diagnosis not present

## 2017-08-06 DIAGNOSIS — Z630 Problems in relationship with spouse or partner: Secondary | ICD-10-CM | POA: Diagnosis not present

## 2017-08-06 DIAGNOSIS — F4010 Social phobia, unspecified: Secondary | ICD-10-CM | POA: Diagnosis not present

## 2017-08-06 DIAGNOSIS — F329 Major depressive disorder, single episode, unspecified: Secondary | ICD-10-CM | POA: Diagnosis present

## 2017-08-06 DIAGNOSIS — R450 Nervousness: Secondary | ICD-10-CM | POA: Diagnosis not present
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
## 2017-08-06 NOTE — Progress Notes (Signed)
Tele psyche in progress  
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
## 2017-08-06 NOTE — Tx Team (Addendum)
Initial Treatment Plan 08/06/2017 11:41 PM Anne Baxter UJW:[id number]    PATIENT STRESSORS: Loss of mother 9 years ago - unresolved grief Conflict with bf   PATIENT STRENGTHS: Ability for insight Average or above average intelligence Capable of independent living MetallurgistCommunication skills Financial means General fund of knowledge Motivation for treatment/growth Physical Health Supportive family/friends Work skills   PATIENT IDENTIFIED PROBLEMS:   "Controlling these suicidal thoughts so that they disappear."    "Coping skills to control my anger."               DISCHARGE CRITERIA:  Improved stabilization in mood, thinking, and/or behavior Need for constant or close observation no longer present Reduction of life-threatening or endangering symptoms to within safe limits Verbal commitment to aftercare and medication compliance  PRELIMINARY DISCHARGE PLAN: Outpatient therapy Return to previous living arrangement Return to previous work or school arrangements  PATIENT/FAMILY INVOLVEMENT: This treatment plan has been presented to and reviewed with the patient, Anne Baxter, and/or family member.  The patient and family have been given the opportunity to ask questions and make suggestions.  Lawrence MarseillesFriedman, [name], RN 08/06/2017, 11:41 PM

## 2017-08-07 DIAGNOSIS — F4010 Social phobia, unspecified: Secondary | ICD-10-CM

## 2017-08-07 DIAGNOSIS — F332 Major depressive disorder, recurrent severe without psychotic features: Secondary | ICD-10-CM | POA: Diagnosis present

## 2017-08-07 DIAGNOSIS — F419 Anxiety disorder, unspecified: Secondary | ICD-10-CM

## 2017-08-07 DIAGNOSIS — Z3490 Encounter for supervision of normal pregnancy, unspecified, unspecified trimester: Secondary | ICD-10-CM

## 2017-08-07 DIAGNOSIS — R450 Nervousness: Secondary | ICD-10-CM

## 2017-08-07 DIAGNOSIS — Z630 Problems in relationship with spouse or partner: Secondary | ICD-10-CM

## 2017-08-07 DIAGNOSIS — G4700 Insomnia, unspecified: Secondary | ICD-10-CM

## 2017-08-07 MED ORDER — ALUM & MAG HYDROXIDE-SIMETH 200-200-20 MG/5ML PO SUSP: 30.0000 mL | ORAL | Status: DC | PRN

## 2017-08-07 MED ORDER — PRENATAL MULTIVITAMIN CH
1.0000 | ORAL_TABLET | Freq: Every day | ORAL | Status: DC
  Administered 2017-08-07: 1 via ORAL
  Filled 2017-08-07 (×5): qty 1

## 2017-08-07 MED ORDER — TRAZODONE HCL 50 MG PO TABS
50.0000 mg | ORAL_TABLET | Freq: Every evening | ORAL | Status: DC | PRN
  Administered 2017-08-09 – 2017-08-10 (×2): 50 mg via ORAL
  Filled 2017-08-07 (×2): qty 1

## 2017-08-07 MED ORDER — MAGNESIUM HYDROXIDE 400 MG/5ML PO SUSP: 30.0000 mL | Freq: Every day | ORAL | Status: DC | PRN

## 2017-08-07 MED ORDER — SERTRALINE HCL 50 MG PO TABS
50.0000 mg | ORAL_TABLET | Freq: Every day | ORAL | Status: DC
  Filled 2017-08-07 (×2): qty 1

## 2017-08-07 MED ORDER — ACETAMINOPHEN 325 MG PO TABS: 650.0000 mg | ORAL_TABLET | Freq: Four times a day (QID) | ORAL | Status: DC | PRN

## 2017-08-07 MED ORDER — SERTRALINE HCL 50 MG PO TABS: 50.0000 mg | ORAL_TABLET | Freq: Every day | ORAL | Status: DC

## 2017-08-07 MED ORDER — SERTRALINE HCL 25 MG PO TABS
25.0000 mg | ORAL_TABLET | Freq: Once | ORAL | Status: AC
  Administered 2017-08-07: 25 mg via ORAL
  Filled 2017-08-07 (×2): qty 1

## 2017-08-07 NOTE — Plan of Care (Signed)
Patient verbalizes understanding of information, education provided. 

## 2017-08-07 NOTE — Progress Notes (Signed)
Recreation Therapy Notes  Date: 3.18.19 Time: 9:30 a.m. Location: [address] Dayroom  Group Topic: Stress Management  Goal Area(s) Addresses:  Goal 1.1: To reduce stress  -Patient will report feeling a reduction in stress level  -Patient will identify the importance of stress management  -Patient will participate during stress management group treatment    Behavioral Response: Engaged  Intervention: Stress Management  Activity: Meditattion- Patients were in a peaceful environment with soft lighting enhancing patients mood.   Education: Stress Management, Discharge Planning.   Education Outcome: Acknowledges edcuation/In group clarification offered/Needs additional education  Clinical Observations/Feedback:: Patient attended and participated appropriately during stress management group treatment. Patient reported feeling a reduction in stress level.   [name] Juleah Paradise, Recreation Therapy Intern   Sheryle HailDarian Kairi Harshbarger 08/07/2017 12:44 PM

## 2017-08-07 NOTE — [facility] Counselor (Signed)
Adult Comprehensive Assessment  Patient ID: Anne Baxter, female   DOB: 10-01-1990, 27 y.o.   MRN: [id number]  Information Source: Information source: Patient  Current Stressors:  Financial / Lack of resources (include bankruptcy): financial stress--in own apartment alone with her children. Physical health (include injuries & life threatening diseases): pt just found out she is pregnant, unplanned. Social relationships: pt reports conflict with her children's fathre, who she is not currently staying with  Living/Environment/Situation:  Living Arrangements: Children Living conditions (as described by patient or guardian): just moved in 2 weeks ago How long has patient lived in current situation?: 2 weeks What is atmosphere in current home: Comfortable  Family History:  Marital status: Single Long term relationship, how long?: Pt just ended relationship with her children's father.  6 year relationship What types of issues is patient dealing with in the relationship?: trust issues, lack of communication Are you sexually active?: Yes What is your sexual orientation?: heterosexual Has your sexual activity been affected by drugs, alcohol, medication, or emotional stress?: no Does patient have children?: Yes How many children?: 2 How is patient's relationship with their children?: daughter,4, son,1  Childhood History:  By whom was/is the patient raised?: Grandparents Additional childhood history information: lived with grandparents because her mother was young.  Mother gave birth to pt at age 40.  Positive childhood. Description of patient's relationship with caregiver when they were a child: mom: good, dad: no contact Patient's description of current relationship with people who raised him/her: mom: died Oct 07, 2008: medical issues, dad: no contact How were you disciplined when you got in trouble as a child/adolescent?: appropriate, pt did not get in much trouble Does patient have siblings?:  Yes Number of Siblings: 1 Description of patient's current relationship with siblings: younger sister, "great" relationship Did patient suffer any verbal/emotional/physical/sexual abuse as a child?: No Did patient suffer from severe childhood neglect?: No Has patient ever been sexually abused/assaulted/raped as an adolescent or adult?: No Was the patient ever a victim of a crime or a disaster?: No Witnessed domestic violence?: No Has patient been effected by domestic violence as an adult?: No  Education:  Highest grade of school patient has completed: HS diploma, some college Currently a Consulting civil engineer?: No Learning disability?: No  Employment/Work Situation:   Employment situation: Employed Where is patient currently employed?: [employer] How long has patient been employed?: 6 months Patient's job has been impacted by current illness: Yes Describe how patient's job has been impacted: a little.  Has missed work due to mental health problems What is the longest time patient has a held a job?: almost 3 years Where was the patient employed at that time?: [employer] Has patient ever been in the [employer]?: No Are There Guns or Other Weapons in Your Home?: No  Financial Resources:   Financial resources: Income from employment, Medicaid Does patient have a representative payee or guardian?: No  Alcohol/Substance Abuse:   What has been your use of drugs/alcohol within the last 12 months?: alcohol: pt denies, drugs: pt denies If attempted suicide, did drugs/alcohol play a role in this?: No Alcohol/Substance Abuse Treatment Hx: Denies past history Has alcohol/substance abuse ever caused legal problems?: No  Social Support System:   Patient's Community Support System: Good Describe Community Support System: family, friends Type of faith/religion: none How does patient's faith help to cope with current illness?: na  Leisure/Recreation:   Leisure and Hobbies: time with family,  children, beach, travel  Strengths/Needs:   What  things does the patient do well?: I don't know In what areas does patient struggle / problems for patient: trying to be best I can be, trying to be a role model for my children, financies  Discharge Plan:   Does patient have access to transportation?: Yes Will patient be returning to same living situation after discharge?: Yes Currently receiving community mental health services: Yes (From Whom)([facility]: Antonietta [place] Thompson) Does patient have financial barriers related to discharge medications?: No  Summary/Recommendations:   Summary and Recommendations (to be completed by the evaluator): Pt [place] 27 [place]  Pt is diagnosed with major depressive disorder and was admitted due to increased depression and suicidal ideaiton.  Pt reports financial stressors and fighting with the father of her children.  Recommendations for pt include crisis stabilization, therapeutic mileu, attend and participate in groups, medication management, and development of comprhensive mental wellness plan.  Lorri FrederickWierda, Gurley Climer Jon. 08/07/2017

## 2017-08-07 NOTE — H&P (Signed)
Psychiatric Admission Assessment Adult  Patient Identification: Anne Baxter MRN:  [id number] Date of Evaluation:  08/07/2017 Chief Complaint:  mdd Principal Diagnosis: Severe recurrent major depression without psychotic features [facility]) Diagnosis:   Patient Active Problem List   Diagnosis Date Noted  . Severe recurrent major depression without psychotic features (HCC) [F33.2] 08/07/2017  . Pregnant [Z34.90] 08/07/2017  . Depression [F32.9] 09/20/2016  . History of migraine headaches [Z86.69] 03/16/2016   History of Present Illness:  08/06/17 [facility] Counselor Assessment: 27 y.o. female. Pt reports SI with a plan to run her car into a building. Pt denies previous SI attempts. Pt denies HI and AVH. Pt is 10 or [redacted] weeks pregnant. Pt states she has 2 other children. Pt states she is under a lot of stress and she and her ex-boyfriend are constantly fighting. Per Pt she is seeing a therapist through her employer's [employer] program every other week. Pt denies mental health medication. Pt denies previous inpatient treatment. Pt reports depressive symptoms. Pt states she is not sleeping and not eating. Pt denies weight loss.  Pt states he mind is constantly "on."  On Evaluation: Patient confirms the above information. She states that she did become very angry and now she can't believe she was thinking about suicide. The ex she was arguing with is the father of all of her children. Her children are with their grandmother at this time. She does want something to help with depression and sleep. She has tried Zoloft in the past but for a limited time. She is willing to use a medication again knowing that she is pregnant and doesn't want those feeling to come back. She states that she feels safe here and has not thought about SI since admission. She reported that she has had the feeling of suicide for a while, but it became "more real" recently.   Associated Signs/Symptoms: Depression Symptoms:  depressed  mood, anhedonia, insomnia, fatigue, feelings of worthlessness/guilt, hopelessness, suicidal thoughts with specific plan, anxiety, loss of energy/fatigue, disturbed sleep, (Hypo) Manic Symptoms:  denies Anxiety Symptoms:  Excessive Worry, Social Anxiety, Psychotic Symptoms:  Denies PTSD Symptoms: NA Total Time spent with patient: 45 minutes  Past Psychiatric History: MDD  Is the patient at risk to self? Yes.    Has the patient been a risk to self in the past 6 months? No.  Has the patient been a risk to self within the distant past? No.  Is the patient a risk to others? No.  Has the patient been a risk to others in the past 6 months? No.  Has the patient been a risk to others within the distant past? No.   Prior Inpatient Therapy:   Prior Outpatient Therapy:    Alcohol Screening: 1. How often do you have a drink containing alcohol?: Never 2. How many drinks containing alcohol do you have on a typical day when you are drinking?: 1 or 2 3. How often do you have six or more drinks on one occasion?: Never AUDIT-C Score: 0 4. How often during the last year have you found that you were not able to stop drinking once you had started?: Never 5. How often during the last year have you failed to do what was normally expected from you becasue of drinking?: Never 6. How often during the last year have you needed a first drink in the morning to get yourself going after a heavy drinking session?: Never 7. How often during the last year have you had a  feeling of guilt of remorse after drinking?: Never 8. How often during the last year have you been unable to remember what happened the night before because you had been drinking?: Never 9. Have you or someone else been injured as a result of your drinking?: No 10. Has a relative or friend or a doctor or another health worker been concerned about your drinking or suggested you cut down?: No Alcohol Use Disorder Identification Test Final Score  (AUDIT): 0 Intervention/Follow-up: AUDIT Score <7 follow-up not indicated Substance Abuse History in the last 12 months:  No. Consequences of Substance Abuse: NA Previous Psychotropic Medications: yes - Zoloft Psychological Evaluations: Yes  Past Medical History:  Past Medical History:  Diagnosis Date  . Anemia 2010   FE  . Depression   . Headache   . Infection 02/2017   CHLAMYDIA  . Pregnant   . Syncope   . Wrist fracture 2009 X2    Past Surgical History:  Procedure Laterality Date  . NO PAST SURGERIES     Family History:  Family History  Problem Relation Age of Onset  . Kidney disease Mother        FAILURE  . Heart disease Mother        CHF  . Early death Mother 88  . Hypertension Mother   . Thrombophlebitis Mother   . Hyperlipidemia Maternal Grandmother   . Hypertension Maternal Grandmother    Family Psychiatric  History: Denies Tobacco Screening: Have you used any form of tobacco in the last 30 days? (Cigarettes, Smokeless Tobacco, Cigars, and/or Pipes): No Social History:  Social History   Substance and Sexual Activity  Alcohol Use No  . Alcohol/week: 0.0 oz   Comment: RARELY     Social History   Substance and Sexual Activity  Drug Use No    Additional Social History:        Allergies:  No Known Allergies Lab Results: No results found for this or any previous visit (from the past 48 hour(s)).  Blood Alcohol level:  No results found for: [facility]  Metabolic Disorder Labs:  No results found for: HGBA1C, MPG No results found for: PROLACTIN No results found for: CHOL, TRIG, HDL, CHOLHDL, VLDL, LDLCALC  Current Medications: Current Facility-Administered Medications  Medication Dose Route Frequency Provider Last Rate Last Dose  . acetaminophen (TYLENOL) tablet 650 mg  650 mg Oral Q6H PRN Nira Conn A, NP      . alum & mag hydroxide-simeth (MAALOX/MYLANTA) 200-200-20 MG/5ML suspension 30 mL  30 mL Oral Q4H PRN Nira Conn A, NP      . magnesium  hydroxide (MILK OF MAGNESIA) suspension 30 mL  30 mL Oral Daily PRN Nira Conn A, NP      . prenatal multivitamin tablet 1 tablet  1 tablet Oral Daily Nira Conn A, NP   1 tablet at 08/07/17 0850  . sertraline (ZOLOFT) tablet 25 mg  25 mg Oral Once Izediuno, Delight Ovens, MD      . Melene Muller ON 08/08/2017] sertraline (ZOLOFT) tablet 50 mg  50 mg Oral Daily Izediuno, Vincent A, MD      . traZODone (DESYREL) tablet 50 mg  50 mg Oral QHS PRN Izediuno, Delight Ovens, MD       PTA Medications: No medications prior to admission.    Musculoskeletal: Strength & Muscle Tone: within normal limits Gait & Station: normal Patient leans: N/A  Psychiatric Specialty Exam: Physical Exam  Nursing note and vitals reviewed. Constitutional: She is oriented to  [place], place, and time. She appears well-developed and well-nourished.  Respiratory: Effort normal.  Musculoskeletal: Normal range of motion.  Neurological: She is alert and oriented to [place], place, and time.  Skin: Skin is warm.    Review of Systems  Constitutional: Negative.   HENT: Negative.   Eyes: Negative.   Respiratory: Negative.   Cardiovascular: Negative.   Gastrointestinal: Negative.   Genitourinary: Negative.   Musculoskeletal: Negative.   Skin: Negative.   Neurological: Negative.   Endo/Heme/Allergies: Negative.   Psychiatric/Behavioral: Positive for depression. Negative for hallucinations, substance abuse and suicidal ideas. The patient is nervous/anxious and has insomnia.     Blood pressure 117/83, pulse (!) 117, temperature 98.5 F (36.9 C), temperature source Oral, resp. rate 18, height 5\' 6"  (1.676 m), weight 56.7 kg (125 lb 0 oz), last menstrual period 04/22/2017, not currently breastfeeding.Body mass index is 20.18 kg/m.  General Appearance: Casual  Eye Contact:  Good  Speech:  Clear and Coherent and Normal Rate  Volume:  Normal  Mood:  Euthymic  Affect:  Flat  Thought Process:  Goal Directed and Descriptions of  Associations: Intact  Orientation:  Full (Time, Place, and [place])  Thought Content:  WDL  Suicidal Thoughts:  No  Homicidal Thoughts:  No  Memory:  Immediate;   Good Recent;   Good Remote;   Good  Judgement:  Fair  Insight:  Fair  Psychomotor Activity:  Normal  Concentration:  Concentration: Good and Attention Span: Good  Recall:  Good  Fund of Knowledge:  Good  Language:  Good  Akathisia:  No  Handed:  Right  AIMS (if indicated):     Assets:  Communication Skills Desire for Improvement Financial Resources/Insurance Housing Physical Health Social Support Transportation  ADL's:  Intact  Cognition:  WNL  Sleep:  Number of Hours: 5.75    Treatment Plan Summary: Daily contact with patient to assess and evaluate symptoms and progress in treatment, Medication management and Plan is to:  -Encourage group therapy participation  -See MAR and SRA for medication management  Observation Level/Precautions:  15 minute checks  Laboratory:  Reviewed  Psychotherapy:  Group therapy  [facility]  Consultations:  As needed  Discharge Concerns:  Compliance  Estimated LOS: 3-5 Days  Other:  Admit to [address]   Physician Treatment Plan for Primary Diagnosis: Severe recurrent major depression without psychotic features (HCC) Long Term Goal(s): Improvement in symptoms so as ready for discharge  Short Term Goals: Ability to identify changes in lifestyle to reduce recurrence of condition will improve, Ability to verbalize feelings will improve and Ability to disclose and discuss suicidal ideas  Physician Treatment Plan for Secondary Diagnosis: Principal Problem:   Severe recurrent major depression without psychotic features (HCC) Active Problems:   Pregnant  Long Term Goal(s): Improvement in symptoms so as ready for discharge  Short Term Goals: Ability to demonstrate self-control will improve, Ability to identify and develop effective coping behaviors will improve and  Compliance with prescribed medications will improve  I certify that inpatient services furnished can reasonably be expected to improve the patient's condition.    Maryfrances Bunnellravis B Camisha Srey, FNP 3/18/20192:34 PM

## 2017-08-07 NOTE — Progress Notes (Signed)
D: Patient asked to speak to this writer at the conclusion of evening wrap up group. Patient concerned as she states, "ever since I took that zoloft my stomach has hurt, like nauseous." Patient states this did not happen last time she was started on zoloft and states it could be pregnancy related however it differs from nausea she has had thus far.  Pointed out it could be prenatal vitamin that was started today. Patient states she has not been taking one PTA. Patient's affect flat, anxious with congruent mood. Pleasant and cooperative. Denies pain.   A: No medications ordered this evening, no prns requested or required. Level III obs in place for safety. Emotional support and reassurance offered. Encouraged to speak with MD tomorrow regarding medication concerns.   R: Patient verbalizes understanding of POC. Patient denies SI/HI/AVH and remains safe on level III obs. Will continue to monitor closely.

## 2017-08-07 NOTE — Progress Notes (Signed)
Adult Psychoeducational Group Note  Date:  08/07/2017 Time:  8:46 PM  Group Topic/Focus:  Wrap-Up Group:   The focus of this group is to help patients review their daily goal of treatment and discuss progress on daily workbooks.  Participation Level:  Active  Participation Quality:  Appropriate  Affect:  Appropriate  Cognitive:  Alert  Insight: Appropriate  Engagement in Group:  Engaged  Modes of Intervention:  Discussion  Additional Comments:  Patient stated having a good day, besides being a little overwhelming due to it being her first day. Patient wants to work on getting better while she's here.   Analina Filla L Calisha Tindel 08/07/2017, 8:46 PM

## 2017-08-07 NOTE — Progress Notes (Signed)
DAR Note: Pt A & O X4. Visible in bed on initial encounter. Presents with depressed affect and mood. Denies SI, HI, AVH and pain "not right this moment". Reports fair sleep, fair appetite, normal energy and good concentration level on self inventory sheet. Rates her depression 6/10, hopelessness 2/10 and anxiety 4/10. Pt's goal for today is to "become positive, learning how to be more accepting of change". Scheduled medications given as ordered with verbal education and effects monitored. Emotional support and encouragement provided to pt. Safety checks maintained at Q 15 minutes intervals without self harm gestures or outburst. Pt remains medication compliant. Denies adverse drug reactions when assessed. Pt went off unit for meals and activities, returned to unit without issues. POC maintained for safety and mood stability.

## 2017-08-07 NOTE — Progress Notes (Signed)
Adult Psychoeducational Group Note  Date:  08/07/2017 Time:  4:15 PM  Group Topic/Focus:  Healthy Communication:   The focus of this group is to discuss communication, barriers to communication, as well as healthy ways to communicate with others.  Participation Level:  Active  Participation Quality:  Appropriate and Sharing  Affect:  Flat  Cognitive:  Alert and Appropriate  Insight: Improving  Engagement in Group:  Developing/Improving  Modes of Intervention:  Discussion and Support  Additional Comments:  Patient tossed a ball around to members of the group and answered questions. Patient identified importance of getting to know peers, themselves and build communication skills.  Taheerah Guldin A Josilynn Losh 08/07/2017, 4:45 PM 

## 2017-08-07 NOTE — [facility] Group Notes (Signed)
[facility] LCSW Group Therapy Note  Date/Time: 08/07/17, 1315  Type of Therapy and Topic:  Group Therapy:  Overcoming Obstacles  Participation Level:  moderate  Description of Group:    In this group patients will be encouraged to explore what they see as obstacles to their own wellness and recovery. They will be guided to discuss their thoughts, feelings, and behaviors related to these obstacles. The group will process together ways to cope with barriers, with attention given to specific choices patients can make. Each patient will be challenged to identify changes they are motivated to make in order to overcome their obstacles. This group will be process-oriented, with patients participating in exploration of their own experiences as well as giving and receiving support and challenge from other group members.  Therapeutic Goals: 1. Patient will identify personal and current obstacles as they relate to admission. 2. Patient will identify barriers that currently interfere with their wellness or overcoming obstacles.  3. Patient will identify feelings, thought process and behaviors related to these barriers. 4. Patient will identify two changes they are willing to make to overcome these obstacles:    Summary of Patient Progress: Pt shared that depression and anxiety are her current obstacles.  Pt was attentive in group and made several contributions to the group discussion about ways to work towards overcoming these obstacles.      Therapeutic Modalities:   Cognitive Behavioral Therapy Solution Focused Therapy Motivational Interviewing Relapse Prevention Therapy  Daleen SquibbGreg Jostin Rue, LCSW

## 2017-08-07 NOTE — Tx Team (Signed)
Interdisciplinary Treatment and Diagnostic Plan Update  08/07/2017 Time of Session: [name] MRN: [id number]  Principal Diagnosis: <principal problem not specified>  Secondary Diagnoses: Active Problems:   Severe recurrent major depression without psychotic features (HCC)   Current Medications:  Current Facility-Administered Medications  Medication Dose Route Frequency Provider Last Rate Last Dose  . acetaminophen (TYLENOL) tablet 650 mg  650 mg Oral Q6H PRN Lindon Romp A, NP      . alum & mag hydroxide-simeth (MAALOX/MYLANTA) 200-200-20 MG/5ML suspension 30 mL  30 mL Oral Q4H PRN Lindon Romp A, NP      . magnesium hydroxide (MILK OF MAGNESIA) suspension 30 mL  30 mL Oral Daily PRN Lindon Romp A, NP      . prenatal multivitamin tablet 1 tablet  1 tablet Oral Daily Lindon Romp A, NP       PTA Medications: Medications Prior to Admission  Medication Sig Dispense Refill Last Dose  . metroNIDAZOLE (METROGEL) 0.75 % vaginal gel Place 1 Applicatorful vaginally at bedtime. Apply one applicatorful to vagina at bedtime for 5 days 70 g 1   . Prenatal Multivit-Min-Fe-FA (PRENATAL VITAMINS) 0.8 MG tablet Take 1 tablet by mouth daily. 30 tablet 12     Patient Stressors: Loss of GM 9 years ago (unresolved grief) Other: conflict with bf  Patient Strengths: Ability for insight Average or above average intelligence Capable of independent living Occupational psychologist fund of knowledge Motivation for treatment/growth Physical Health Supportive family/friends Work skills  Treatment Modalities: Medication Management, Group therapy, Case management,  1 to 1 session with clinician, Psychoeducation, Recreational therapy.   Physician Treatment Plan for Primary Diagnosis: <principal problem not specified> Long Term Goal(s):     Short Term Goals:    Medication Management: Evaluate patient's response, side effects, and tolerance of medication  regimen.  Therapeutic Interventions: 1 to 1 sessions, Unit Group sessions and Medication administration.  Evaluation of Outcomes: Not Met  Physician Treatment Plan for Secondary Diagnosis: Active Problems:   Severe recurrent major depression without psychotic features ([place])  Long Term Goal(s):     Short Term Goals:       Medication Management: Evaluate patient's response, side effects, and tolerance of medication regimen.  Therapeutic Interventions: 1 to 1 sessions, Unit Group sessions and Medication administration.  Evaluation of Outcomes: Not Met   RN Treatment Plan for Primary Diagnosis: <principal problem not specified> Long Term Goal(s): Knowledge of disease and therapeutic regimen to maintain health will improve  Short Term Goals: Ability to remain free from injury will improve, Ability to verbalize frustration and anger appropriately will improve, Ability to demonstrate self-control, Ability to participate in decision making will improve, Ability to verbalize feelings will improve, Ability to disclose and discuss suicidal ideas, Ability to identify and develop effective coping behaviors will improve and Compliance with prescribed medications will improve  Medication Management: RN will administer medications as ordered by provider, will assess and evaluate patient's response and provide education to patient for prescribed medication. RN will report any adverse and/or side effects to prescribing provider.  Therapeutic Interventions: 1 on 1 counseling sessions, Psychoeducation, Medication administration, Evaluate responses to treatment, Monitor vital signs and CBGs as ordered, Perform/monitor CIWA, COWS, AIMS and Fall Risk screenings as ordered, Perform wound care treatments as ordered.  Evaluation of Outcomes: Not Met   LCSW Treatment Plan for Primary Diagnosis: <principal problem not specified> Long Term Goal(s): Safe transition to appropriate next level of care at discharge,  Engage patient in  therapeutic group addressing interpersonal concerns.  Short Term Goals: Engage patient in aftercare planning with referrals and resources, Increase social support, Increase ability to appropriately verbalize feelings, Increase emotional regulation, Facilitate acceptance of mental health diagnosis and concerns, Facilitate patient progression through stages of change regarding substance use diagnoses and concerns, Identify triggers associated with mental health/substance abuse issues and Increase skills for wellness and recovery  Therapeutic Interventions: Assess for all discharge needs, 1 to 1 time with Social worker, Explore available resources and support systems, Assess for adequacy in community support network, Educate family and significant other(s) on suicide prevention, Complete Psychosocial Assessment, Interpersonal group therapy.  Evaluation of Outcomes: Not Met   Progress in Treatment: Attending groups: No.New to the unit Participating in groups: No. Taking medication as prescribed: Yes. Toleration medication: Yes. Family/Significant other contact made: No, will contact:  CSW will continue to assess Patient understands diagnosis: Yes. Discussing patient identified problems/goals with staff: Yes. Medical problems stabilized or resolved: Yes. Denies suicidal/homicidal ideation: No. Issues/concerns per patient self-inventory: No. Other:   New problem(s) identified: None  New Short Term/Long Term Goal(s): medication stabilization, elimination of SI thoughts, development of comprehensive mental wellness plan.   Patient Goals:  Discharge Plan or Barriers: CSW will continue to assess for an appropriate discharge plan  Reason for Continuation of Hospitalization: Depression Medication stabilization Suicidal ideation  Estimated Length of Stay: Thursday, 08/10/17  Attendees: Patient: Anne Baxter 08/07/2017 8:58 AM  Physician: Dr. Leanord Hawking 08/07/2017 8:58 AM   Nursing: Nicoletta Dress.Viona Gilmore, RN 08/07/2017 8:58 AM  RN Care Manager: Rhunette Croft 08/07/2017 8:58 AM  Social Worker: Radonna Ricker, [place] 08/07/2017 8:58 AM  Recreational Therapist: Rhunette Croft 08/07/2017 8:58 AM  Other: X 08/07/2017 8:58 AM  Other: X 08/07/2017 8:58 AM  Other:X 08/07/2017 8:58 AM    Scribe for Treatment Team: Marylee Floras, [place] 08/07/2017 8:58 AM

## 2017-08-07 NOTE — Progress Notes (Signed)
Adult Psychoeducational Group Note  Date:  08/07/2017 Time:  11:53 AM  Group Topic/Focus:  Building Self Esteem:   The Focus of this group is helping patients become aware of the effects of self-esteem on their lives, the things they and others do that enhance or undermine their self-esteem, seeing the relationship between their level of self-esteem and the choices they make and learning ways to enhance self-esteem.  Participation Level:  Active  Participation Quality:  Attentive  Affect:  Appropriate  Cognitive:  Alert  Insight: Appropriate  Engagement in Group:  Engaged  Modes of Intervention:  Activity  Additional Comments:  Pt attended group and participated in activity and discussion.  Karren Newland R Aalina Brege 08/07/2017, 11:53 AM

## 2017-08-07 NOTE — [facility] Suicide Risk Assessment (Signed)
[facility] Admission Suicide Risk Assessment   Nursing information obtained from:  Patient, Review of record Demographic factors:  (pt denies all) Current Mental Status:  Suicidal ideation indicated by patient, Suicide plan, Plan includes specific time, place, or method, Self-harm thoughts, Intention to act on suicide plan Loss Factors:  (denies all, unresolved grief) Historical Factors:  (denies all) Risk Reduction Factors:  Pregnancy, Responsible for children under 46 years of age, Sense of responsibility to family, Employed, Living with another [place], especially a relative, Positive social support, Positive therapeutic relationship  Total Time spent with patient: 45 minutes Principal Problem: MDD Diagnosis:   Patient Active Problem List   Diagnosis Date Noted  . Severe recurrent major depression without psychotic features (HCC) [F33.2] 08/07/2017  . Depression [F32.9] 09/20/2016  . History of migraine headaches [Z86.69] 03/16/2016   Subjective Data:  27 y.o AAF, single, lives with her kids, employed. Eleven weeks pregnant.  Background history of Unipolar Depression. Responded to Sertraline in the past. Presented to the ER on account of suicidal thoughts. Main stressor difficult relationship with her boyfriend. Had thoughts of suicide. Came to seek help. No past suicidal behavior, no family history of suicide, no evidence of psychosis. No evidence of mania. No cognitive impairment. No access to weapons. We discussed treatment options. We discussed use of Sertraline again. Risk benefit analysis especially with respect to pregnancy was discussed. Her concerns were addressed. She consented to treatment. She also consented to use of Trazodone PRN for insomnia after we reviewed the risks and benefits.  She is cooperative with care. She has agreed to treatment recommendations. She has agreed to communicate suicidal thoughts to staff if the thoughts becomes overwhelming.    Continued Clinical Symptoms:   Alcohol Use Disorder Identification Test Final Score (AUDIT): 0 The "Alcohol Use Disorders Identification Test", Guidelines for Use in Primary Care, Second Edition.  World Science writer [facility]). Score between 0-7:  no or low risk or alcohol related problems. Score between 8-15:  moderate risk of alcohol related problems. Score between 16-19:  high risk of alcohol related problems. Score 20 or above:  warrants further diagnostic evaluation for alcohol dependence and treatment.   CLINICAL FACTORS:   Depression:   Severe   Musculoskeletal: Strength & Muscle Tone: within normal limits Gait & Station: normal Patient leans: N/A  Psychiatric Specialty Exam: Physical Exam  Constitutional: She is oriented to [place], place, and time. She appears well-developed and well-nourished.  HENT:  Head: Normocephalic and atraumatic.  Respiratory: Effort normal.  Neurological: She is alert and oriented to [place], place, and time.  Psychiatric:  As above     ROS  Blood pressure 117/83, pulse (!) 117, temperature 98.5 F (36.9 C), temperature source Oral, resp. rate 18, height 5\' 6"  (1.676 m), weight 56.7 kg (125 lb 0 oz), last menstrual period 04/22/2017, not currently breastfeeding.Body mass index is 20.18 kg/m.  General Appearance: Casual and Neat  Eye Contact:  Good  Speech:  Clear and Coherent and Normal Rate  Volume:  Normal  Mood:  Depressed  Affect:  Restricted  Thought Process:  Linear  Orientation:  Full (Time, Place, and [place])  Thought Content:  Rumination  Suicidal Thoughts:  None currently  Homicidal Thoughts:  No  Memory:  Immediate;   Good Recent;   Good Remote;   Good  Judgement:  Fair  Insight:  Good  Psychomotor Activity:  Decreased  Concentration:  Concentration: Good and Attention Span: Good  Recall:  Good  Fund of Knowledge:  Good  Language:  Good  Akathisia:  Negative  Handed:    AIMS (if indicated):     Assets:  Communication Skills Desire for  Improvement Housing Resilience Transportation Vocational/Educational  ADL's:  Intact  Cognition:  WNL  Sleep:  Number of Hours: 5.75      COGNITIVE FEATURES THAT CONTRIBUTE TO RISK:  None    SUICIDE RISK:   Mild:  Suicidal ideation of limited frequency, intensity, duration, and specificity.  There are no identifiable plans, no associated intent, mild dysphoria and related symptoms, good self-control (both objective and subjective assessment), few other risk factors, and identifiable protective factors, including available and accessible social support.  PLAN OF CARE:  1. Sertraline 25 mg daily. Would titrate as tolerated/needed 2. Q 15 minute check for suicide. 3. Monitor mood, behavior and interaction with peers 4. SW would gather collateral from her family    I certify that inpatient services furnished can reasonably be expected to improve the patient's condition.   Georgiann CockerVincent A Shir Bergman, MD 08/07/2017, 2:13 PM

## 2017-08-08 DIAGNOSIS — Z3401 Encounter for supervision of normal first pregnancy, first trimester: Secondary | ICD-10-CM

## 2017-08-08 DIAGNOSIS — Z6379 Other stressful life events affecting family and household: Secondary | ICD-10-CM

## 2017-08-08 MED ORDER — CITALOPRAM HYDROBROMIDE 10 MG PO TABS: 10.0000 mg | ORAL_TABLET | Freq: Every day | ORAL | Status: DC

## 2017-08-08 MED ORDER — CITALOPRAM HYDROBROMIDE 10 MG PO TABS
10.0000 mg | ORAL_TABLET | Freq: Every day | ORAL | Status: DC
  Administered 2017-08-08 – 2017-08-11 (×4): 10 mg via ORAL
  Filled 2017-08-08 (×8): qty 1

## 2017-08-08 NOTE — [facility] Suicide Risk Assessment (Signed)
[facility] INPATIENT:  Family/Significant Other Suicide Prevention Education  Suicide Prevention Education:  Education Completed; Anne Baxter, friend, [phone], has been identified by the patient as the family member/significant other with whom the patient will be residing, and identified as the Popson(s) who will aid the patient in the event of a mental health crisis (suicidal ideations/suicide attempt).  With written consent from the patient, the family member/significant other has been provided the following suicide prevention education, prior to the and/or following the discharge of the patient.  The suicide prevention education provided includes the following:  Suicide risk factors  Suicide prevention and interventions  National Suicide Hotline telephone number  [facility] assessment telephone number  [facility] 911  [facility] and/or Residential Mobile Crisis Unit telephone number  Request made of family/significant other to:  Remove weapons (e.g., guns, rifles, knives), all items previously/currently identified as safety concern.  Anne Baxter is not aware that pt owns any guns.  Remove drugs/medications (over-the-counter, prescriptions, illicit drugs), all items previously/currently identified as a safety concern.  The family member/significant other verbalizes understanding of the suicide prevention education information provided.  The family member/significant other agrees to remove the items of safety concern listed above.  Anne Baxter reports that she and pt have been friends for 14 years.  Anne Baxter does not think pt ever got over the death of her mother 7-8 years ago.  Pt behavior has changed in the last few months.  Pt is very withdrawn, shuts down, doesn't want to talk to anyone.  Anne Baxter used to talk to the pt every day and now it is only a few times a week.    Lorri FrederickWierda, Anne Hausner Jon, LCSW 08/08/2017, 3:37 PM

## 2017-08-08 NOTE — [facility] Group Notes (Signed)
Adult Psychoeducational Group Note  Date:  08/08/2017 Time:  11:40 PM  Group Topic/Focus:  Wrap-Up Group:   The focus of this group is to help patients review their daily goal of treatment and discuss progress on daily workbooks.  Participation Level:  Active  Participation Quality:  Appropriate and Attentive  Affect:  Appropriate  Cognitive:  Alert and Appropriate  Insight: Appropriate and Good  Engagement in Group:  Engaged  Modes of Intervention:  Discussion and Education  Additional Comments:  Pt attended and participated in wrap up group this evening. Pt had a good day and was able to watch tv with other pt. A positive noted by the pt was that they got to know the other pt.   Chrisandra NettersOctavia A Jayani Rozman 08/08/2017, 11:40 PM

## 2017-08-08 NOTE — [facility] Group Notes (Signed)
Adult Psychoeducational Group Note  Date:  08/08/2017 Time:  9:46 AM  Group Topic/Focus:  Goals Group:   The focus of this group is to help patients establish daily goals to achieve during treatment and discuss how the patient can incorporate goal setting into their daily lives to aide in recovery.  Participation Level:  Active  Participation Quality:  Appropriate and Attentive  Affect:  Appropriate  Cognitive:  Alert  Insight: Appropriate  Engagement in Group:  Engaged  Modes of Intervention:  Orientation  Additional Comments:  Pt goal for today is to speak with MD and get a better understanding of the medicine that she will be taking.  Dellia NimsJaquesha M Redith Drach 08/08/2017, 9:46 AM

## 2017-08-08 NOTE — [facility] Group Notes (Signed)
LCSW Group Therapy Note 08/08/2017 11:43 AM  Type of Therapy/Topic: Group Therapy: Feelings about Diagnosis  Participation Level: Active   Description of Group:  This group will allow patients to explore their thoughts and feelings about diagnoses they have received. Patients will be guided to explore their level of understanding and acceptance of these diagnoses. Facilitator will encourage patients to process their thoughts and feelings about the reactions of others to their diagnosis and will guide patients in identifying ways to discuss their diagnosis with significant others in their lives. This group will be process-oriented, with patients participating in exploration of their own experiences, giving and receiving support, and processing challenge from other group members.  Therapeutic Goals: 1. Patient will demonstrate understanding of diagnosis as evidenced by identifying two or more symptoms of the disorder 2. Patient will be able to express two feelings regarding the diagnosis 3. Patient will demonstrate their ability to communicate their needs through discussion and/or role play  Summary of Patient Progress:  Anne Baxter was engaged and participated throughout the group's discussion. Anne Baxter reports that when she was diagnosed with a mental health illness, "it was painful to hear". She reports that she was in denial for a long time. Anne Baxter reports that her mental health diagnosis helped her build her relationships with loved ones because she could be more open and honest about what she was going through. Anne Baxter reports that her diagnosis changed who she was. Anne Baxter states that she plans to be more open and honest with loved ones and wants to educate and spend more time with her children so they "can understand what Mommy is going through".   Therapeutic Modalities:  Cognitive Behavioral Therapy Brief Therapy Feelings Identification    Kandie Keiper Catalina AntiguaWilliams LCSWA Clinical Social Worker

## 2017-08-08 NOTE — Progress Notes (Signed)
[facility] MD Progress Note  08/08/2017 12:57 PM Anne Baxter  MRN:  [id number] Subjective:  Patient reports she is feeling " a bit better ". Currently denies suicidal ideations . She does report nausea, abdominal discomfort , and attributes these symptoms to Zoloft trial, as she feels they are temporally related to starting this medication. She is currently in her first trimester of pregnancy, but states that she did not have significant nausea/vomiting or " morning sickness " during prior pregnancies, so thinks it is Zoloft rather than pregnancy related . Objective : I have discussed case with treatment team and have met with patient. 27 year old female, admitted due to depression, suicidal ideations. Reports this was related mainly to arguments and tense relationship with her BF/father of her children.  She does acknowledge feeling depressed . At this time denies suicidal ideations and contracts for safety on unit. As above, she reports she is currently [redacted] weeks pregnant . She was recently restarted on Zoloft, which she states she had taken before and felt it helped . However, as above, she reports new onset GI symptoms which she feels are Sertraline related. We discussed risk versus benefit profiles and concerns regarding antidepressant management during pregnancy, and reviewed potential issues regarding WDL and possible  pulmonary HTN associated with SSRIs in pregnancy. Patient states she does feel she warrants an antidepressant at this time, and feels benefit outweighs risk at this time, but is wanting to change to another antidepressant in the hope that it does not cause GI symptoms. We discussed options and agrees to [place] .  On unit presents calm, pleasant on approach.  At this time denies SI.   Principal Problem: Severe recurrent major depression without psychotic features [facility]) Diagnosis:   Patient Active Problem List   Diagnosis Date Noted  . Severe recurrent major depression without psychotic  features ([place]) [F33.2] 08/07/2017  . Pregnant [Z34.90] 08/07/2017  . Depression [F32.9] 09/20/2016  . History of migraine headaches [Z86.69] 03/16/2016   Total Time spent with patient: 20 minutes  Past Medical History:  Past Medical History:  Diagnosis Date  . Anemia 2010   FE  . Depression   . Headache   . Infection 02/2017   CHLAMYDIA  . Pregnant   . Syncope   . Wrist fracture 2009 X2    Past Surgical History:  Procedure Laterality Date  . NO PAST SURGERIES     Family History:  Family History  Problem Relation Age of Onset  . Kidney disease Mother        FAILURE  . Heart disease Mother        CHF  . Early death Mother 6  . Hypertension Mother   . Thrombophlebitis Mother   . Hyperlipidemia Maternal Grandmother   . Hypertension Maternal Grandmother    Social History:  Social History   Substance and Sexual Activity  Alcohol Use No  . Alcohol/week: 0.0 oz   Comment: RARELY     Social History   Substance and Sexual Activity  Drug Use No    Social History   Socioeconomic History  . Marital status: Single    Spouse name: None  . Number of children: 1  . Years of education: 83  . Highest education level: None  Social Needs  . Financial resource strain: None  . Food insecurity - worry: None  . Food insecurity - inability: None  . Transportation needs - medical: None  . Transportation needs - non-medical: None  Occupational History  .  Occupation: CUSTOMER SERVICE    Employer: [employer]  Tobacco Use  . Smoking status: Never Smoker  . Smokeless tobacco: Never Used  Substance and Sexual Activity  . Alcohol use: No    Alcohol/week: 0.0 oz    Comment: RARELY  . Drug use: No  . Sexual activity: Yes    Partners: Male    Birth control/protection: None  Other Topics Concern  . None  Social History Narrative   Lives at home with boyfriend and daughter.   Right-handed.   No caffeine use.   Additional Social History:   Sleep: Fair  Appetite:   Fair  Current Medications: Current Facility-Administered Medications  Medication Dose Route Frequency Provider Last Rate Last Dose  . acetaminophen (TYLENOL) tablet 650 mg  650 mg Oral Q6H PRN Lindon Romp A, NP      . alum & mag hydroxide-simeth (MAALOX/MYLANTA) 200-200-20 MG/5ML suspension 30 mL  30 mL Oral Q4H PRN Lindon Romp A, NP      . citalopram (CELEXA) tablet 10 mg  10 mg Oral Daily Austan Nicholl, Myer Peer, MD   10 mg at 08/08/17 1212  . magnesium hydroxide (MILK OF MAGNESIA) suspension 30 mL  30 mL Oral Daily PRN Lindon Romp A, NP      . prenatal multivitamin tablet 1 tablet  1 tablet Oral Daily Lindon Romp A, NP   1 tablet at 08/07/17 0850  . traZODone (DESYREL) tablet 50 mg  50 mg Oral QHS PRN Izediuno, Laruth Bouchard, MD        Lab Results: No results found for this or any previous visit (from the past 48 hour(s)).  Blood Alcohol level:  No results found for: [facility]  Metabolic Disorder Labs: No results found for: HGBA1C, MPG No results found for: PROLACTIN No results found for: CHOL, TRIG, HDL, CHOLHDL, VLDL, LDLCALC  Physical Findings: AIMS: Facial and Oral Movements Muscles of Facial Expression: None, normal Lips and Perioral Area: None, normal Jaw: None, normal Tongue: None, normal,Extremity Movements Upper (arms, wrists, hands, fingers): None, normal Lower (legs, knees, ankles, toes): None, normal, Trunk Movements Neck, shoulders, hips: None, normal, Overall Severity Severity of abnormal movements (highest score from questions above): None, normal Incapacitation due to abnormal movements: None, normal Patient's awareness of abnormal movements (rate only patient's report): No Awareness, Dental Status Current problems with teeth and/or dentures?: No Does patient usually wear dentures?: No  CIWA:    COWS:     Musculoskeletal: Strength & Muscle Tone: within normal limits Gait & Station: normal Patient leans: N/A  Psychiatric Specialty Exam: Physical Exam  ROS denies  headache, denies chest pain, no shortness of breath, reports nausea, abdominal discomfort, denies vomiting .  Blood pressure 123/82, pulse (!) 122, temperature 98.5 F (36.9 C), temperature source Oral, resp. rate 18, height 5' 6"  (1.676 m), weight 56.7 kg (125 lb 0 oz), last menstrual period 04/22/2017, not currently breastfeeding.Body mass index is 20.18 kg/m.  General Appearance: Well Groomed  Eye Contact:  Fair  Speech:  Normal Rate  Volume:  Decreased  Mood:  depressed, but states feeling better today  Affect:  constricted but reactive, brightens during session  Thought Process:  Linear and Descriptions of Associations: Intact  Orientation:  Full (Time, Place, and [place])  Thought Content:  no hallucinations, no delusions, not internally preoccupied   Suicidal Thoughts:  No today denies suicidal or self injurious ideations, contracts for safety on unit at present   Homicidal Thoughts:  No also specifically denies HI towards BF  Memory:  recent and remote grossly intact   Judgement:  Other:  fair   Insight:  Fair  Psychomotor Activity:  Normal  Concentration:  Concentration: Good and Attention Span: Good  Recall:  Good  Fund of Knowledge:  Good  Language:  Good  Akathisia:  Negative  Handed:  Right  AIMS (if indicated):     Assets:  Communication Skills Desire for Improvement Resilience  ADL's:  Intact  Cognition:  WNL  Sleep:  Number of Hours: 6.5   Assessment - patient reports depression, anxiety, which she attributes in part to relationship stressors with her BF/father of her children. She is [redacted] weeks pregnant. She is interested in an antidepressant but feels Zoloft is causing nausea /GI side effects and prefers to switch to another antidepressant trial at this time. We have discussed risk /benefit considerations in the context of her pregnancy status .   Treatment Plan Summary: Daily contact with patient to assess and evaluate symptoms and progress in treatment,  Medication management, Plan inpatient treatment  and medications as below Encourage group and milieu participation to work on coping skills and symptom reduction Treatment team working on disposition planning options Continue Prenatal Vitamin D/C Zoloft- see rationale above Start Celexa 10 mgrs QDAY for depression, anxiety, side effects and considerations regarding safety in pregnancy discussed  Continue Trazodone 50 mgrsd QHS PRN  Jenne Campus, MD 08/08/2017, 12:57 PM

## 2017-08-09 MED ORDER — PRENATAL MULTIVITAMIN CH
1.0000 | ORAL_TABLET | Freq: Every day | ORAL | Status: DC
  Filled 2017-08-09 (×2): qty 1

## 2017-08-09 NOTE — Progress Notes (Signed)
D: Patient observed resting in bed. Keeps to self and is rarely in the dayroom (during evening hours.) Patient states she is doing "okay" but has reconsidered taking celexa and states she does not wish to continue it. "The doctor gave me the option so I don't think I want to stay on it." Patient's affect masked, anxious with flat, sad mood. Patient is observed to be less depressed from this writer admitted her 2 days ago. No periods of tearfulness, no expressions of hopelessness. Denies pain, physical complaints. No reports of nausea, abdominal pain this evening.    A: No medications ordered this shift, no prns requested or required. Level III obs in place for safety. Emotional support offered. Encouraged to let MD know tomorrow of her decision to not proceed with an antidepressant.   R: Patient verbalizes understanding of POC, information, education provided. Patient denies SI/HI/AVH and remains safe on level III obs. Will continue to monitor closely and make verbal contact frequently.

## 2017-08-09 NOTE — Progress Notes (Addendum)
[facility]  08/09/2017 2:38 PM Lyndee Leosiah L Al  MRN:  [id number]   Subjective:  Patient reports that she is feeling good today. She states she has had no side effects or adverse effects from the Celexa and wishes to continue it. She denies any depression, or SI/HI/AVH and contracts for safety. She reports some anxiety and it is worse in the morning and improves through the day. She is planning to discharge tomorrow. She reports sporatic nausea and suspected to be associated with the pregnancy.   Objective: Patient's chart and findings reviewed and discussed with treatment team. Patient has been seen in the day room interacting with peer sand staff appropriately. She has been pleasant and cooperative. She has been attending groups and participating. She has been compliant with treatment. Will continue current medications and plan for discharge in the morning.   Principal Problem: Severe recurrent major depression without psychotic features (HCC) Diagnosis:   Patient Active Problem List   Diagnosis Date Noted  . Severe recurrent major depression without psychotic features (HCC) [F33.2] 08/07/2017  . Pregnant [Z34.90] 08/07/2017  . Depression [F32.9] 09/20/2016  . History of migraine headaches [Z86.69] 03/16/2016   Total Time spent with patient: 15 minutes  Past Psychiatric History: See H&P  Past Medical History:  Past Medical History:  Diagnosis Date  . Anemia 2010   FE  . Depression   . Headache   . Infection 02/2017   CHLAMYDIA  . Pregnant   . Syncope   . Wrist fracture 2009 X2    Past Surgical History:  Procedure Laterality Date  . NO PAST SURGERIES     Family History:  Family History  Problem Relation Age of Onset  . Kidney disease Mother        FAILURE  . Heart disease Mother        CHF  . Early death Mother 1231  . Hypertension Mother   . Thrombophlebitis Mother   . Hyperlipidemia Maternal Grandmother   . Hypertension Maternal Grandmother    Family  Psychiatric  History: See H&P Social History:  Social History   Substance and Sexual Activity  Alcohol Use No  . Alcohol/week: 0.0 oz   Comment: RARELY     Social History   Substance and Sexual Activity  Drug Use No    Social History   Socioeconomic History  . Marital status: Single    Spouse name: None  . Number of children: 1  . Years of education: 7215  . Highest education level: None  Social Needs  . Financial resource strain: None  . Food insecurity - worry: None  . Food insecurity - inability: None  . Transportation needs - medical: None  . Transportation needs - non-medical: None  Occupational History  . Occupation: CUSTOMER SERVICE    Employer: [employer]  Tobacco Use  . Smoking status: Never Smoker  . Smokeless tobacco: Never Used  Substance and Sexual Activity  . Alcohol use: No    Alcohol/week: 0.0 oz    Comment: RARELY  . Drug use: No  . Sexual activity: Yes    Partners: Male    Birth control/protection: None  Other Topics Concern  . None  Social History Narrative   Lives at home with boyfriend and daughter.   Right-handed.   No caffeine use.   Additional Social History:                         Sleep: Good  Appetite:  Good  Current Medications: Current Facility-Administered Medications  Medication Dose Route Frequency Provider Last Rate Last Dose  . acetaminophen (TYLENOL) tablet 650 mg  650 mg Oral Q6H PRN Nira Conn A, NP      . alum & mag hydroxide-simeth (MAALOX/MYLANTA) 200-200-20 MG/5ML suspension 30 mL  30 mL Oral Q4H PRN Nira Conn A, NP      . citalopram (CELEXA) tablet 10 mg  10 mg Oral Daily Cobos, Rockey Situ, MD   10 mg at 08/09/17 0813  . magnesium hydroxide (MILK OF MAGNESIA) suspension 30 mL  30 mL Oral Daily PRN Nira Conn A, NP      . prenatal multivitamin tablet 1 tablet  1 tablet Oral Daily Nira Conn A, NP   1 tablet at 08/07/17 0850  . traZODone (DESYREL) tablet 50 mg  50 mg Oral QHS PRN Izediuno, Delight Ovens, MD        Lab Results: No results found for this or any previous visit (from the past 48 hour(s)).  Blood Alcohol level:  No results found for: [facility]  Metabolic Disorder Labs: No results found for: HGBA1C, MPG No results found for: PROLACTIN No results found for: CHOL, TRIG, HDL, CHOLHDL, VLDL, LDLCALC  Physical Findings: AIMS: Facial and Oral Movements Muscles of Facial Expression: None, normal Lips and Perioral Area: None, normal Jaw: None, normal Tongue: None, normal,Extremity Movements Upper (arms, wrists, hands, fingers): None, normal Lower (legs, knees, ankles, toes): None, normal, Trunk Movements Neck, shoulders, hips: None, normal, Overall Severity Severity of abnormal movements (highest score from questions above): None, normal Incapacitation due to abnormal movements: None, normal Patient's awareness of abnormal movements (rate only patient's report): No Awareness, Dental Status Current problems with teeth and/or dentures?: No Does patient usually wear dentures?: No  CIWA:    COWS:     Musculoskeletal: Strength & Muscle Tone: within normal limits Gait & Station: normal Patient leans: N/A  Psychiatric Specialty Exam: Physical Exam  Nursing note and vitals reviewed. Constitutional: She is oriented to [place], place, and time. She appears well-developed and well-nourished.  Respiratory: Effort normal.  Musculoskeletal: Normal range of motion.  Neurological: She is alert and oriented to [place], place, and time.  Skin: Skin is warm.    Review of Systems  Constitutional: Negative.   HENT: Negative.   Eyes: Negative.   Respiratory: Negative.   Cardiovascular: Negative.   Gastrointestinal: Negative.   Genitourinary: Negative.   Musculoskeletal: Negative.   Skin: Negative.   Neurological: Negative.   Endo/Heme/Allergies: Negative.   Psychiatric/Behavioral: Negative for depression, hallucinations and suicidal ideas. The patient is nervous/anxious.     Blood  pressure 134/79, pulse (!) 103, temperature 98.5 F (36.9 C), temperature source Oral, resp. rate 16, height 5\' 6"  (1.676 m), weight 56.7 kg (125 lb 0 oz), last menstrual period 04/22/2017, not currently breastfeeding.Body mass index is 20.18 kg/m.  General Appearance: Casual  Eye Contact:  Good  Speech:  Clear and Coherent and Normal Rate  Volume:  Normal  Mood:  Euthymic  Affect:  Congruent  Thought Process:  Goal Directed and Descriptions of Associations: Intact  Orientation:  Full (Time, Place, and Jani)  Thought Content:  WDL  Suicidal Thoughts:  No  Homicidal Thoughts:  No  Memory:  Immediate;   Good Recent;   Good Remote;   Good  Judgement:  Good  Insight:  Good  Psychomotor Activity:  Normal  Concentration:  Concentration: Good and Attention Span: Good  Recall:  Good  Fund  of Knowledge:  Good  Language:  Good  Akathisia:  No  Handed:  Right  AIMS (if indicated):     Assets:  Communication Skills Desire for Improvement Financial Resources/Insurance Housing Physical Health Social Support Transportation  ADL's:  Intact  Cognition:  WNL  Sleep:  Number of Hours: 6.75   Problems Addressed: MDD severe  Treatment Plan Summary: Daily contact with patient to assess and evaluate symptoms and progress in treatment, Medication management and Plan is to:  -Continue Celexa 10 mg PO Daily for mood stability -Continue Trazodone 50 mg PO QHS PRN for insomnia -Encourage group therapy participation  Maryfrances Bunnell, FNP 08/09/2017, 2:38 PM  Agree with NP Progress Note

## 2017-08-09 NOTE — Plan of Care (Signed)
Patient has not engaged in any aggressive, unpredictable behavior. Interactions are appropriate.

## 2017-08-09 NOTE — [facility] Group Notes (Signed)
[facility] Group Therapy 08/09/2017 1:15pm  Type of Therapy: Mental Health Association Presentation  Participation Level: Active  Participation Quality: Attentive  Affect: Appropriate  Cognitive: Oriented  Insight: Developing/Improving  Engagement in Therapy: Engaged  Modes of Intervention: Discussion, Education and Socialization  Summary of Progress/Problems: Mental Health Association (MHA) Speaker came to talk about his personal journey with mental health. The pt processed ways by which to relate to the speaker. MHA speaker provided handouts and educational information pertaining to groups and services offered by [facility]. Pt was engaged in speaker's presentation and was receptive to resources provided.    Pulte HomesHeather N Smart, LCSW 08/09/2017 9:16 AM

## 2017-08-09 NOTE — Progress Notes (Signed)
D: Pt visible in milieu majority of this shift. Presents with flat affect but forwards and brightens up on interactions. Rates her depression 0/10, anxiety 5/10 "I just keep replaying the weekend in my head, what led me here".  Attended scheduled groups and was engaged. Preoccupied about taking medications "I'm still anxious about everything, medications and my kids".  A: All medications given as scheduled with verbal education and effects monitored. Emotional support and availability provided to pt.Pt encouraged to voice concerns. Safety checks maintained without self harm gestures.  R: Pt is selectively compliant with medications with increased verbal prompts. Refused prenatal vitamin. Continues to report nausea with medications. Tolerates all PO intake well. Off unit for meals, returned without issues.

## 2017-08-09 NOTE — Progress Notes (Signed)
Adult Psychoeducational Group Note  Date:  08/09/2017 Time:  1:30 PM  Group Topic/Focus:  Personal Development  Participation Level:  Active  Participation Quality:  Appropriate  Affect:  Appropriate  Cognitive:  Alert, Appropriate and Oriented  Insight: Improving  Engagement in Group:  Developing/Improving  Modes of Intervention:  Discussion and Education  Additional Comments: Patient was appropriate and participated. Patient stated her family is her support system and a quality she likes about herself is that she is sociable.   Marchelle Folksmanda A Kevia Zaucha 08/09/2017, 3:00 PM

## 2017-08-09 NOTE — Progress Notes (Signed)
Recreation Therapy Notes  Date:3.20.19 Time:9:30 a.m. Location: [address] Dayroom  Group Topic: Stress Management  Goal Area(s) Addresses:  Goal 1.1: To reduce stress  -Patient will report feeling a reduction in stress level  -Patient will identify the importance of stress management  -Patient will participate during stress management group treatment    Intervention: Stress Management  Activity: Guided Imagery- Patients were in a peaceful environment with soft lighting enhancing patients mood. Patients were read a guided imagery script to promote relaxation   Education:Stress Management, Discharge Planning.   Education Outcome:Acknowledges edcuation/In group clarification offered/Needs additional education  Clinical Observations/Feedback::Patient did not attend   [name] Aalani Aikens, Recreation Therapy Intern   Sheryle HailDarian Saahil Herbster 08/09/2017 11:22 AM

## 2017-08-10 MED ORDER — PRENATAL MULTIVITAMIN CH
1.0000 | ORAL_TABLET | Freq: Every day | ORAL | Status: DC
  Administered 2017-08-10: 1 via ORAL
  Filled 2017-08-10 (×3): qty 1

## 2017-08-10 NOTE — Progress Notes (Signed)
Nursing Progress Note [phone]  Data: Patient presents with sullen/sad affect today. Patient took morning medications without incident. Patient reports her nausea has decreased today. She states goal for today is to work on Pharmacologistcoping skills. Patient denies SI/HI/AVH or pain. Patient contracts for safety on the unit at this time. Patient completed self-inventory sheet and rate depression, hopelessness, and anxiety 5,2,5 respectively. Patient is observed up interactive in the milieu.  Action: Patient educated about and provided medication per provider's orders. Patient safety maintained with q15 min safety checks. Low fall risk precautions in place. Emotional support given. 1:1 interaction and active listening provided. Patient encouraged to attend meals and groups. Labs, vital signs and patient behavior monitored throughout shift. Patient encouraged to work on treatment plan and goals.  Response: Patient receptive to interaction with nurse. Patient remains safe on the unit at this time. Patient is appropriate in the milieu. Will continue to support and monitor.

## 2017-08-10 NOTE — [facility] Group Notes (Signed)
[facility] LCSW Group Therapy Note  Date/Time  08/04/17  1:15pm  Type of Therapy/Topic:  Group Therapy:  Balance in Life  Participation Level:  Minimal   Description of Group:    This group will address the concept of balance and how it feels and looks when one is unbalanced. Patients will be encouraged to process areas in their lives that are out of balance, and identify reasons for remaining unbalanced. Facilitators will guide patients utilizing problem- solving interventions to address and correct the stressor making their life unbalanced. Understanding and applying boundaries will be explored and addressed for obtaining  and maintaining a balanced life. Patients will be encouraged to explore ways to assertively make their unbalanced needs known to significant others in their lives, using other group members and facilitator for support and feedback.  Therapeutic Goals: 1. Patient will identify two or more emotions or situations they have that consume much of in their lives. 2. Patient will identify signs/triggers that life has become out of balance:  3. Patient will identify two ways to set boundaries in order to achieve balance in their lives:  4. Patient will demonstrate ability to communicate their needs through discussion and/or role plays  Summary of Patient Progress: Patient appropriately participated in group and but did not contribute to the general discussion. Patient identified mental health/emotional wellbeing and personal relationships as areas of her life that are most imbalanced.   Therapeutic Modalities:   Cognitive Behavioral Therapy Solution-Focused Therapy Assertiveness Training

## 2017-08-10 NOTE — Progress Notes (Addendum)
[facility]  08/10/2017 12:06 PM Anne Baxter  MRN:  [id number]   Subjective:  Patient states that last night she had to talk to her ex-boyfriend about her children. The conversation made her feel very sad again and she feels that she needs more help with coping skills. She doesn't feel suicidal, but she doesn't feel ready to discharge. She denies any SI/HI/AVH and contracts for safety.   Objective: Patient's chart and findings reviewed and discussed with treatment team. Patient presents ion her bed lying down. She appears depressed with a flat affect. She is requesting more 1:1 coping skill therapy. Peer Support is requested to speak with the patient. Will discuss discharge tomorrow.    Principal Problem: Severe recurrent major depression without psychotic features (HCC) Diagnosis:   Patient Active Problem List   Diagnosis Date Noted  . Severe recurrent major depression without psychotic features (HCC) [F33.2] 08/07/2017  . Pregnant [Z34.90] 08/07/2017  . Depression [F32.9] 09/20/2016  . History of migraine headaches [Z86.69] 03/16/2016   Total Time spent with patient: 15 minutes  Past Psychiatric History: See H&P  Past Medical History:  Past Medical History:  Diagnosis Date  . Anemia 2010   FE  . Depression   . Headache   . Infection 02/2017   CHLAMYDIA  . Pregnant   . Syncope   . Wrist fracture 2009 X2    Past Surgical History:  Procedure Laterality Date  . NO PAST SURGERIES     Family History:  Family History  Problem Relation Age of Onset  . Kidney disease Mother        FAILURE  . Heart disease Mother        CHF  . Early death Mother 2231  . Hypertension Mother   . Thrombophlebitis Mother   . Hyperlipidemia Maternal Grandmother   . Hypertension Maternal Grandmother    Family Psychiatric  History: See H&P Social History:  Social History   Substance and Sexual Activity  Alcohol Use No  . Alcohol/week: 0.0 oz   Comment: RARELY     Social History    Substance and Sexual Activity  Drug Use No    Social History   Socioeconomic History  . Marital status: Single    Spouse name: Not on file  . Number of children: 1  . Years of education: 8015  . Highest education level: Not on file  Occupational History  . Occupation: CUSTOMER SERVICE    Employer: [employer]  Social Needs  . Financial resource strain: Not on file  . Food insecurity:    Worry: Not on file    Inability: Not on file  . Transportation needs:    Medical: Not on file    Non-medical: Not on file  Tobacco Use  . Smoking status: Never Smoker  . Smokeless tobacco: Never Used  Substance and Sexual Activity  . Alcohol use: No    Alcohol/week: 0.0 oz    Comment: RARELY  . Drug use: No  . Sexual activity: Yes    Partners: Male    Birth control/protection: None  Lifestyle  . Physical activity:    Days per week: Not on file    Minutes per session: Not on file  . Stress: Not on file  Relationships  . Social connections:    Talks on phone: Not on file    Gets together: Not on file    Attends religious service: Not on file    Active member of club or organization:  Not on file    Attends meetings of clubs or organizations: Not on file    Relationship status: Not on file  Other Topics Concern  . Not on file  Social History Narrative   Lives at home with boyfriend and daughter.   Right-handed.   No caffeine use.   Additional Social History:                         Sleep: Good  Appetite:  Good  Current Medications: Current Facility-Administered Medications  Medication Dose Route Frequency Provider Last Rate Last Dose  . acetaminophen (TYLENOL) tablet 650 mg  650 mg Oral Q6H PRN Nira Conn A, NP      . alum & mag hydroxide-simeth (MAALOX/MYLANTA) 200-200-20 MG/5ML suspension 30 mL  30 mL Oral Q4H PRN Nira Conn A, NP      . citalopram (CELEXA) tablet 10 mg  10 mg Oral Daily Eleanor Gatliff, Rockey Situ, MD   10 mg at 08/10/17 0811  . magnesium hydroxide  (MILK OF MAGNESIA) suspension 30 mL  30 mL Oral Daily PRN Nira Conn A, NP      . prenatal multivitamin tablet 1 tablet  1 tablet Oral Daily Money, Travis B, FNP      . traZODone (DESYREL) tablet 50 mg  50 mg Oral QHS PRN Georgiann Cocker, MD   50 mg at 08/09/17 2103    Lab Results: No results found for this or any previous visit (from the past 48 hour(s)).  Blood Alcohol level:  No results found for: [facility]  Metabolic Disorder Labs: No results found for: HGBA1C, MPG No results found for: PROLACTIN No results found for: CHOL, TRIG, HDL, CHOLHDL, VLDL, LDLCALC  Physical Findings: AIMS: Facial and Oral Movements Muscles of Facial Expression: None, normal Lips and Perioral Area: None, normal Jaw: None, normal Tongue: None, normal,Extremity Movements Upper (arms, wrists, hands, fingers): None, normal Lower (legs, knees, ankles, toes): None, normal, Trunk Movements Neck, shoulders, hips: None, normal, Overall Severity Severity of abnormal movements (highest score from questions above): None, normal Incapacitation due to abnormal movements: None, normal Patient's awareness of abnormal movements (rate only patient's report): No Awareness, Dental Status Current problems with teeth and/or dentures?: No Does patient usually wear dentures?: No  CIWA:    COWS:     Musculoskeletal: Strength & Muscle Tone: within normal limits Gait & Station: normal Patient leans: N/A  Psychiatric Specialty Exam: Physical Exam  Nursing note and vitals reviewed. Constitutional: She is oriented to [place], place, and time. She appears well-developed and well-nourished.  Respiratory: Effort normal.  Musculoskeletal: Normal range of motion.  Neurological: She is alert and oriented to [place], place, and time.  Skin: Skin is warm.    Review of Systems  Constitutional: Negative.   HENT: Negative.   Eyes: Negative.   Respiratory: Negative.   Cardiovascular: Negative.   Gastrointestinal: Negative.    Genitourinary: Negative.   Musculoskeletal: Negative.   Skin: Negative.   Neurological: Negative.   Endo/Heme/Allergies: Negative.   Psychiatric/Behavioral: Positive for depression. Negative for hallucinations and suicidal ideas. The patient is nervous/anxious.     Blood pressure 126/79, pulse (!) 107, temperature 98.9 F (37.2 C), temperature source Oral, resp. rate 16, height 5\' 6"  (1.676 m), weight 56.7 kg (125 lb), last menstrual period 04/22/2017, not currently breastfeeding.Body mass index is 20.18 kg/m.  General Appearance: Casual  Eye Contact:  Good  Speech:  Clear and Coherent and Normal Rate  Volume:  Normal  Mood:  Depressed  Affect:  Flat  Thought Process:  Goal Directed and Descriptions of Associations: Intact  Orientation:  Full (Time, Place, and [place])  Thought Content:  WDL  Suicidal Thoughts:  No  Homicidal Thoughts:  No  Memory:  Immediate;   Good Recent;   Good Remote;   Good  Judgement:  Good  Insight:  Good  Psychomotor Activity:  Normal  Concentration:  Concentration: Good and Attention Span: Good  Recall:  Good  Fund of Knowledge:  Good  Language:  Good  Akathisia:  No  Handed:  Right  AIMS (if indicated):     Assets:  Communication Skills Desire for Improvement Financial Resources/Insurance Housing Physical Health Social Support Transportation  ADL's:  Intact  Cognition:  WNL  Sleep:  Number of Hours: 6.75   Problems Addressed: MDD severe  Treatment Plan Summary: Daily contact with patient to assess and evaluate symptoms and progress in treatment, Medication management and Plan is to:  -Continue Celexa 10 mg PO Daily for mood stability -Continue Trazodone 50 mg PO QHS PRN for insomnia -Encourage group therapy participation  Maryfrances Bunnell, FNP 08/10/2017, 12:06 PM   Agree with NP Progress Note

## 2017-08-10 NOTE — Progress Notes (Signed)
Adult Psychoeducational Group Note  Date:  08/10/2017 Time:  10:13 AM  Group Topic/Focus:  Goals Group:   The focus of this group is to help patients establish daily goals to achieve during treatment and discuss how the patient can incorporate goal setting into their daily lives to aide in recovery. Orientation:   The focus of this group is to educate the patient on the purpose and policies of crisis stabilization and provide a format to answer questions about their admission.  The group details unit policies and expectations of patients while admitted.  Participation Level:  Active  Participation Quality:  Appropriate and Attentive  Affect:  Appropriate  Cognitive:  Appropriate  Insight: Appropriate  Engagement in Group:  Engaged  Modes of Intervention:  Activity and Discussion  Additional Comments:  Pt attended the orientation/goals group and remained appropriate and engaged throughout the duration of the group. Pt stated that she she was interested in her treatment and wanted to better her situation.   Sheran Lawlesseese, Blondell Laperle O 08/10/2017, 10:13 AM

## 2017-08-10 NOTE — Progress Notes (Signed)
Adult Psychoeducational Group Note  Date:  08/10/2017 Time:  4:00 PM  Group Topic/Focus: Crisis Management: Coping Skills Crisis Planning:   The purpose of this group is to help patients create coping skills for use upon discharge or in the future, as needed.  Participation Level:  Minimal  Participation Quality:  Attentive, and Supportive  Affect:  Appropriate  Cognitive:  Alert and Oriented  Insight: Improving  Engagement in Group:  Developing/Improving, and Supportive  Modes of Intervention:  Discussion and Education  Additional Comments:  Patient discussed how she has realized she will need different coping skills for different situations and that there is no "one perfect" coping skill for everything. Patient states she was happing to learn more coping skills than "deep breathing". Patient demonstrated a positive attitude, good insight and was appropriate/engaged in group discussion.  Rae Lipsmanda A Gidget Quizhpi 08/10/2017, 5:29 PM

## 2017-08-10 NOTE — Plan of Care (Signed)
  Problem: Safety: Goal: Periods of time without injury will increase Outcome: Progressing Note:  Pt has not harmed self or others tonight.  She denies SI/HI and verbally contracts for safety.    

## 2017-08-10 NOTE — Plan of Care (Signed)
  Problem: Activity: Goal: Interest or engagement in activities will improve Outcome: Progressing   Problem: Health Behavior/Discharge Planning: Goal: Compliance with treatment plan for underlying cause of condition will improve Outcome: Progressing   Problem: Safety: Goal: Periods of time without injury will increase Outcome: Progressing   Problem: Coping: Goal: Ability to cope will improve Outcome: Progressing   Comments: Patient has been attending groups and taking medications. Patient contracts for safety on the unit. Patient and Clinical research associatewriter discuss individualized coping skills.

## 2017-08-10 NOTE — Progress Notes (Signed)
Adult Psychoeducational Group Note  Date:  08/10/2017 Time:  9:41 PM  Group Topic/Focus:  Wrap-Up Group:   The focus of this group is to help patients review their daily goal of treatment and discuss progress on daily workbooks.  Participation Level:  Active  Participation Quality:  Appropriate  Affect:  Appropriate  Cognitive:  Alert and Oriented  Insight: Good  Engagement in Group:  Engaged  Modes of Intervention:  Discussion  Additional Comments:  Pt rated her day a 7/10. Her goal was to find new coping skills, which she did. Pt is happy to be discharged tomorrow.  Leo GrosserMegan A Telecia Larocque 08/10/2017, 9:41 PM

## 2017-08-10 NOTE — Progress Notes (Signed)
Pt attend wrap up group. Her day was a 5. Her goal was to discuss discharge date with the social worker and was able to accomplish goal.

## 2017-08-10 NOTE — Progress Notes (Signed)
D: Pt was in the hallway upon initial approach.  Pt presents with appropriate affect and mood.  She reports her day was "pretty good" and her goal is to work on "my discharge."  Pt reports "I can go home tomorrow" and she feels safe to do so.  Pt denies SI/HI, denies hallucinations, denies pain.  Pt has been visible in milieu interacting with peers and staff appropriately.    A: Introduced self to pt.  Actively listened to pt and offered support and encouragement. PRN medication administered for sleep per pt request.  Q15 minute safety checks maintained.  R: Pt is safe on the unit.  Pt is compliant with medication.  Pt verbally contracts for safety.  Will continue to monitor and assess.

## 2017-08-11 MED ORDER — CITALOPRAM HYDROBROMIDE 10 MG PO TABS: 10.0000 mg | ORAL_TABLET | Freq: Every day | ORAL | 0 refills | Status: DC

## 2017-08-11 MED ORDER — TRAZODONE HCL 50 MG PO TABS: 50.0000 mg | ORAL_TABLET | Freq: Every evening | ORAL | 0 refills | Status: DC | PRN

## 2017-08-11 NOTE — Progress Notes (Signed)
Anne Baxter was discharged at this time. She denies SI/HI. Discharge instructions provided and understanding was verbalized. Prescriptions provided and all belongings were returned. Anne Baxter left ambulatory and in no acute distress.

## 2017-08-11 NOTE — [facility] Suicide Risk Assessment (Signed)
[facility] Discharge Suicide Risk Assessment   Principal Problem: Severe recurrent major depression without psychotic features [facility]) Discharge Diagnoses:  Patient Active Problem List   Diagnosis Date Noted  . Severe recurrent major depression without psychotic features (HCC) [F33.2] 08/07/2017  . Pregnant [Z34.90] 08/07/2017  . Depression [F32.9] 09/20/2016  . History of migraine headaches [Z86.69] 03/16/2016    Total Time spent with patient: 30 minutes  Musculoskeletal: Strength & Muscle Tone: within normal limits Gait & Station: normal Patient leans: N/A  Psychiatric Specialty Exam: ROS denies headache, no chest pain, no shortness of breath, no vomiting, no fever, denies any abdominal cramping or any GU bleeding/spotting. Nausea has been persistent but has improved significantly and no vomiting .  Blood pressure 109/71, pulse (!) 105, temperature 99.2 F (37.3 C), temperature source Oral, resp. rate 18, height 5\' 6"  (1.676 m), weight 56.7 kg (125 lb), last menstrual period 04/22/2017, not currently breastfeeding.Body mass index is 20.18 kg/m.  General Appearance: Well Groomed  Eye Contact::  Good  Speech:  Normal Rate409  Volume:  Normal  Mood:  improved, states she is feeling better, presents euthymic at this time  Affect:  Appropriate and Full Range  Thought Process:  Linear and Descriptions of Associations: Intact  Orientation:  Full (Time, Place, and Salce)  Thought Content:  no hallucinations, no delusions, not internally preoccupied  Suicidal Thoughts:  No denies suicidal or self injurious ideations, denies any homicidal or violent ideations, future oriented   Homicidal Thoughts:  No  Memory:  recent and remote grossly intact   Judgement:  Other:  improving   Insight:  improving   Psychomotor Activity:  Normal  Concentration:  Good  Recall:  Good  Fund of Knowledge:Good  Language: Good  Akathisia:  Negative  Handed:  Right  AIMS (if indicated):     Assets:   Communication Skills Desire for Improvement Resilience  Sleep:  Number of Hours: 6.75  Cognition: WNL  ADL's:  Intact   Mental Status Per Nursing Assessment::   On Admission:  Suicidal ideation indicated by patient, Suicide plan, Plan includes specific time, place, or method, Self-harm thoughts, Intention to act on suicide plan  Demographic Factors:  27 year old female, has 2 children  Loss Factors: relationship stressors with father of children, pregnancy  Historical Factors: History of depression, had been on Zoloft in the past   Risk Reduction Factors:   Responsible for children under 30 years of age, Sense of responsibility to family and Positive coping skills or problem solving skills  Continued Clinical Symptoms:  At this time patient is alert, attentive, well related, pleasant, mood improved and currently euthymic, with full range of affect, no thought disorder, no suicidal or self injurious ideations, no homicidal or violent ideations, no psychotic symptoms,  future oriented.  Denies medication side effects. Have again reviewed potential risks versus benefits of Celexa regarding her pregnancy status , and have reviewed listed potential risks, such as  possible neonatal WDL. Have stressed importance of continuing to take Prenatal Vitamin and regular prenatal follow up with her Obstetrician . Behavior on unit calm, pleasant on approach.   Cognitive Features That Contribute To Risk:  No gross cognitive deficits noted upon discharge. Is alert , attentive, and oriented x 3    Suicide Risk:  Mild:  Suicidal ideation of limited frequency, intensity, duration, and specificity.  There are no identifiable plans, no associated intent, mild dysphoria and related symptoms, good self-control (both objective and subjective assessment), few other risk factors,  and identifiable protective factors, including available and accessible social support.  Follow-up Information    [facility] Go on 08/23/2017.   Why:  Please attend your medication appointment with Leone Payorrystal Montague on Wednesday, 08/23/17, at 1:00pm. Contact information: [address] [place] [address] [phone]        [facility]. Go on 08/16/2017.   Why:  Please meet with Marthenia Rollingonya Thompson on Wednesday, 08/16/17, at 3:00pm. Contact information: Marthenia Rollingonya Thompson, [facility] [address] [place], [place] [address] P: [phone] F: none          Plan Of Care/Follow-up recommendations:  Activity:  as tolerated  Diet:  regular Tests:  NA Other:  See below  Patient is expressing readiness for discharge and is leaving unit in good spirits  Plans to return home Plans to follow up as above [facility] [place], and plans to follow up within next week.   Craige CottaFernando A Cobos, MD 08/11/2017, 12:45 PM

## 2017-08-11 NOTE — Discharge Summary (Addendum)
Physician Discharge Summary Note  Patient:  Anne Baxter is an 27 y.o., female MRN:  [id number] DOB:  [birth year] Patient phone:  [phone] (home)  Patient address:   [address] [place] [address],  Total Time spent with patient: 20 minutes  Date of Admission:  08/06/2017 Date of Discharge: 08/11/2017  Reason for Admission: [facility] Assessment: 26 y.o.female.Pt reports SI with a plan to run her car into a building. Pt denies previous SI attempts. Pt denies HI and AVH. Pt is 10 or [redacted] weeks pregnant. Pt states she has 2 other children. Pt states she is under a lot of stress and she and her ex-boyfriend are constantly fighting. Per Pt she is seeing a therapist through her employer's [employer] program every other week. Pt denies mental health medication. Pt denies previous inpatient treatment. Pt reports depressive symptoms. Pt states she is not sleeping and not eating. Pt denies weight loss. Pt states he mind is constantly "on."    Principal Problem: Severe recurrent major depression without psychotic features [facility](HCC) Discharge Diagnoses: Patient Active Problem List   Diagnosis Date Noted  . Severe recurrent major depression without psychotic features (HCC) [F33.2] 08/07/2017  . Pregnant [Z34.90] 08/07/2017  . Depression [F32.9] 09/20/2016  . History of migraine headaches [Z86.69] 03/16/2016    Past Psychiatric History:  Past Medical History:  Past Medical History:  Diagnosis Date  . Anemia 2010   FE  . Depression   . Headache   . Infection 02/2017   CHLAMYDIA  . Pregnant   . Syncope   . Wrist fracture 2009 X2    Past Surgical History:  Procedure Laterality Date  . NO PAST SURGERIES     Family History:  Family History  Problem Relation Age of Onset  . Kidney disease Mother        FAILURE  . Heart disease Mother        CHF  . Early death Mother [id number]  . Hypertension Mother   . Thrombophlebitis Mother   . Hyperlipidemia Maternal  Grandmother   . Hypertension Maternal Grandmother    Family Psychiatric  History:  Social History:  Social History   Substance and Sexual Activity  Alcohol Use No  . Alcohol/week: 0.0 oz   Comment: RARELY     Social History   Substance and Sexual Activity  Drug Use No    Social History   Socioeconomic History  . Marital status: Single    Spouse name: Not on file  . Number of children: 1  . Years of education: 5615  . Highest education level: Not on file  Occupational History  . Occupation: CUSTOMER SERVICE    Employer: [employer]  Social Needs  . Financial resource strain: Not on file  . Food insecurity:    Worry: Not on file    Inability: Not on file  . Transportation needs:    Medical: Not on file    Non-medical: Not on file  Tobacco Use  . Smoking status: Never Smoker  . Smokeless tobacco: Never Used  Substance and Sexual Activity  . Alcohol use: No    Alcohol/week: 0.0 oz    Comment: RARELY  . Drug use: No  . Sexual activity: Yes    Partners: Male    Birth control/protection: None  Lifestyle  . Physical activity:    Days per week: Not on file    Minutes per session: Not on file  . Stress: Not on file  Relationships  . Social connections:    Talks on phone: Not on file    Gets together: Not on file    Attends religious service: Not on file    Active member of club or organization: Not on file    Attends meetings of clubs or organizations: Not on file    Relationship status: Not on file  Other Topics Concern  . Not on file  Social History Narrative   Lives at home with boyfriend and daughter.   Right-handed.   No caffeine use.    Hospital Course:  Anne Baxter was admitted for Severe recurrent major depression without psychotic features (HCC) and crisis management.  Pt was treated discharged with the medications listed below under Medication List.  Medical problems were identified and treated as needed.  Home medications were restarted as  appropriate.  Improvement was monitored by observation and Anne Baxter 's daily report of symptom reduction.  Emotional and mental status was monitored by daily self-inventory reports completed by Anne Baxter and clinical staff.         Anne Baxter was evaluated by the treatment team for stability and plans for continued recovery upon discharge. Anne Baxter 's motivation was an integral factor for scheduling further treatment. Employment, transportation, bed availability, health status, family support, and any pending legal issues were also considered during hospital stay. Pt was offered further treatment options upon discharge including but not limited to Residential, Intensive Outpatient, and Outpatient treatment.  Anne Baxter will follow up with the services as listed below under Follow Up Information.     Upon completion of this admission the patient was both mentally and medically stable for discharge denying suicidal/homicidal ideation, auditory/visual/tactile hallucinations, delusional thoughts and paranoia.    Anne Baxter responded well to treatment with Celexa 10 mg and Trazdone 50 mg without adverse effects.  Pt demonstrated improvement without reported or observed adverse effects to the point of stability appropriate for outpatient management. Pertinent labs include:CBC and Urine pregnancy + for which outpatient follow-up is necessary for lab recheck as mentioned below. Reviewed CBC, CMP, BAL, and UDS; all unremarkable aside from noted exceptions.   Physical Findings: AIMS: Facial and Oral Movements Muscles of Facial Expression: None, normal Lips and Perioral Area: None, normal Jaw: None, normal Tongue: None, normal,Extremity Movements Upper (arms, wrists, hands, fingers): None, normal Lower (legs, knees, ankles, toes): None, normal, Trunk Movements Neck, shoulders, hips: None, normal, Overall Severity Severity of abnormal movements (highest score from questions  above): None, normal Incapacitation due to abnormal movements: None, normal Patient's awareness of abnormal movements (rate only patient's report): No Awareness, Dental Status Current problems with teeth and/or dentures?: No Does patient usually wear dentures?: No  CIWA:    COWS:     Musculoskeletal: Strength & Muscle Tone: within normal limits Gait & Station: normal Patient leans: N/A  Psychiatric Specialty Exam: See SRA by MD Physical Exam  Vitals reviewed. Constitutional: She appears well-developed.  Neurological: She is alert.  Psychiatric: She has a normal mood and affect. Her behavior is normal.    Review of Systems  Psychiatric/Behavioral: Negative for depression and substance abuse.  All other systems reviewed and are negative.   Blood pressure 109/71, pulse (!) 105, temperature 99.2 F (37.3 C), temperature source Oral, resp. rate 18, height 5\' 6"  (1.676 m), weight 56.7 kg (125 lb), last menstrual period 04/22/2017, not currently breastfeeding.Body mass index is 20.18 kg/m.    Have you used any  form of tobacco in the last 30 days? (Cigarettes, Smokeless Tobacco, Cigars, and/or Pipes): No  Has this patient used any form of tobacco in the last 30 days? (Cigarettes, Smokeless Tobacco, Cigars, and/or Pipes)  No  Blood Alcohol level:  No results found for: [facility]  Metabolic Disorder Labs:  No results found for: HGBA1C, MPG No results found for: PROLACTIN No results found for: CHOL, TRIG, HDL, CHOLHDL, VLDL, LDLCALC  See Psychiatric Specialty Exam and Suicide Risk Assessment completed by Attending Physician prior to discharge.  Discharge destination:  Home  Is patient on multiple antipsychotic therapies at discharge:  No   Has Patient had three or more failed trials of antipsychotic monotherapy by history:  No  Recommended Plan for Multiple Antipsychotic Therapies: NA  Discharge Instructions    Diet - low sodium heart healthy   Complete by:  As directed     Discharge instructions   Complete by:  As directed    Take all medications as prescribed. Keep all follow-up appointments as scheduled.  Do not consume alcohol or use illegal drugs while on prescription medications. Report any adverse effects from your medications to your primary care provider promptly.  In the event of recurrent symptoms or worsening symptoms, call 911, a crisis hotline, or go to the nearest emergency department for evaluation.   Increase activity slowly   Complete by:  As directed      Allergies as of 08/11/2017   No Known Allergies     Medication List    TAKE these medications     Indication  citalopram 10 MG tablet Commonly known as:  CELEXA Take 1 tablet (10 mg total) by mouth daily. Start taking on:  08/12/2017  Indication:  Generalized Anxiety Disorder   traZODone 50 MG tablet Commonly known as:  DESYREL Take 1 tablet (50 mg total) by mouth at bedtime as needed for sleep.  Indication:  Trouble Sleeping      Follow-up Information    [facility]. Go on 08/23/2017.   Why:  Please attend your medication appointment with Leone Payor on Wednesday, 08/23/17, at 1:00pm. Contact information: [address] [place] [address] [phone]        [facility] E[facility]. Go on 08/16/2017.   Why:  Please meet with Marthenia Rolling on Wednesday, 08/16/17, at 3:00pm. Contact information: Marthenia Rolling, [facility] [address] [place], [place] [address] P: [phone] F: none          Follow-up recommendations:  Activity:  as tolorated Diet:  heart healthy  Comments:  Take all medications as prescribed. Keep all follow-up appointments as scheduled.  Do not consume alcohol or use illegal drugs while on prescription medications. Report any adverse effects from your medications to your primary care provider promptly.  In the event of recurrent symptoms or worsening symptoms, call 911, a crisis hotline, or go to the nearest  emergency department for evaluation.   Signed: Oneta Rack, NP 08/11/2017, 2:31 PM   Patient seen, Suicide Assessment Completed.  Disposition Plan Reviewed

## 2017-08-11 NOTE — Progress Notes (Signed)
Psychoeducational Group Note  Date:  08/11/2017 Time:  1340  Group Topic/Focus:  Goals Group:   The focus of this group is to help patients establish daily goals to achieve during treatment and discuss how the patient can incorporate goal setting into their daily lives to aide in recovery.  Participation Level: Did Not Attend  Participation Quality:  Not Applicable  Affect:  Not Applicable  Cognitive:  Not Applicable  Insight:  Not Applicable  Engagement in Group: Not Applicable  Additional Comments:  Pt was meeting with the RN and could not attend group.  Moncia Annas E 08/11/2017, 1:41 PM

## 2017-08-11 NOTE — Progress Notes (Signed)
  [facility] Dis[facility] :  Will you be returning to the same living situation after discharge:  Yes,  own home At discharge, do you have transportation home?: Yes,  friend Do you have the ability to pay for your medications: Yes,  medicaid  Release of information consent forms completed and in the chart;  Patient's signature needed at discharge.  Patient to Follow up at: Follow-up Information    [facility]. Go on 08/23/2017.   Why:  Please attend your medication appointment with Leone Payorrystal Montague on Wednesday, 08/23/17, at 1:00pm. Contact information: [address] [place] [address] [phone]        [facility]. Go on 08/16/2017.   Why:  Please meet with Marthenia Rollingonya Thompson on Wednesday, 08/16/17, at 3:00pm. Contact information: Marthenia Rollingonya Thompson, [facility] [address] [place], [place] [address] P: [phone] F: none          Next level of care provider has access to [facility] Link:no  Safety Planning and Suicide Prevention discussed: Yes,  with friend  Have you used any form of tobacco in the last 30 days? (Cigarettes, Smokeless Tobacco, Cigars, and/or Pipes): No  Has patient been referred to the Quitline?: N/A patient is not a smoker  Patient has been referred for addiction treatment: Yes  Lorri FrederickWierda, Eniyah Eastmond Jon, LCSW 08/11/2017, 11:05 AM

## 2017-08-11 NOTE — Progress Notes (Signed)
Recreation Therapy Notes  Date: 3.22.19 Time: 9:30 a.m. Location: [address] Dayroom   Group Topic: Stress Management   Goal Area(s) Addresses:  Goal 1.1: To reduce stress  -Patient will report feeling a reduction in stress level  -Patient will identify the importance of stress management  -Patient will participate during stress management group treatment     Behavioral Response: Engaged   Intervention: Stress Management   Activity: Meditattion- Patients were in a peaceful environment with soft lighting enhancing patients mood. Patients listened to mindfulness voice over to decrease stress levels   Education: Stress Management, Discharge Planning.    Education Outcome: Acknowledges edcuation/In group clarification offered/Needs additional education   Clinical Observations/Feedback:: Patient attended and participated appropriately during stress management group treatment. Patient reported feeling a reduction in stress level.     [name] [name], Recreation Therapy Intern   Sheryle HailDarian Huyen Perazzo 08/11/2017 11:47 AM

## 2017-08-11 NOTE — Progress Notes (Signed)
Pt is prepared for dc as Clinical research associatewriter reviews her daily assessment with her. ON this, she wrties she deneis SI today and she rates her depression, hopelessness and anxeity " 4/2/6", respectively. All dc instructions are reviewed with her, she is given cc ( SRA, AVS, SSP and transition record) and then she is escorted to bldg entracne and dc'd.

## 2017-08-11 NOTE — Tx Team (Signed)
Interdisciplinary Treatment and Diagnostic Plan Update  08/11/2017 Time of Session: 09:30am ,Anne Baxter MRN: [id number]  Principal Diagnosis: Severe recurrent major depression without psychotic features (HCC)  Secondary Diagnoses: Principal Problem:   Severe recurrent major depression without psychotic features (HCC) Active Problems:   Pregnant   Current Medications:  Current Facility-Administered Medications  Medication Dose Route Frequency Provider Last Rate Last Dose  . acetaminophen (TYLENOL) tablet 650 mg  650 mg Oral Q6H PRN Nira Conn A, NP      . alum & mag hydroxide-simeth (MAALOX/MYLANTA) 200-200-20 MG/5ML suspension 30 mL  30 mL Oral Q4H PRN Nira Conn A, NP      . citalopram (CELEXA) tablet 10 mg  10 mg Oral Daily Cobos, Rockey Situ, MD   10 mg at 08/11/17 0910  . magnesium hydroxide (MILK OF MAGNESIA) suspension 30 mL  30 mL Oral Daily PRN Nira Conn A, NP      . prenatal multivitamin tablet 1 tablet  1 tablet Oral QHS Otho Bellows, RPH   1 tablet at 08/10/17 2105  . traZODone (DESYREL) tablet 50 mg  50 mg Oral QHS PRN Georgiann Cocker, MD   50 mg at 08/10/17 2104   PTA Medications: No medications prior to admission.    Patient Stressors: Loss of GM 9 years ago (unresolved grief) Other: conflict with bf  Patient Strengths: Ability for insight Average or above average intelligence Capable of independent living Metallurgist fund of knowledge Motivation for treatment/growth Physical Health Supportive family/friends Work skills  Treatment Modalities: Medication Management, Group therapy, Case management,  1 to 1 session with clinician, Psychoeducation, Recreational therapy.   Physician Treatment Plan for Primary Diagnosis: Severe recurrent major depression without psychotic features (HCC) Long Term Goal(s): Improvement in symptoms so as ready for discharge Improvement in symptoms so as ready for discharge   Short  Term Goals: Ability to identify changes in lifestyle to reduce recurrence of condition will improve Ability to verbalize feelings will improve Ability to disclose and discuss suicidal ideas Ability to demonstrate self-control will improve Ability to identify and develop effective coping behaviors will improve Compliance with prescribed medications will improve  Medication Management: Evaluate patient's response, side effects, and tolerance of medication regimen.  Therapeutic Interventions: 1 to 1 sessions, Unit Group sessions and Medication administration.  Evaluation of Outcomes: Adequate for Discharge  Physician Treatment Plan for Secondary Diagnosis: Principal Problem:   Severe recurrent major depression without psychotic features (HCC) Active Problems:   Pregnant  Long Term Goal(s): Improvement in symptoms so as ready for discharge Improvement in symptoms so as ready for discharge   Short Term Goals: Ability to identify changes in lifestyle to reduce recurrence of condition will improve Ability to verbalize feelings will improve Ability to disclose and discuss suicidal ideas Ability to demonstrate self-control will improve Ability to identify and develop effective coping behaviors will improve Compliance with prescribed medications will improve     Medication Management: Evaluate patient's response, side effects, and tolerance of medication regimen.  Therapeutic Interventions: 1 to 1 sessions, Unit Group sessions and Medication administration.  Evaluation of Outcomes: Adequate for Discharge   RN Treatment Plan for Primary Diagnosis: Severe recurrent major depression without psychotic features (HCC) Long Term Goal(s): Knowledge of disease and therapeutic regimen to maintain health will improve  Short Term Goals: Ability to verbalize frustration and anger appropriately will improve, Ability to verbalize feelings will improve, Ability to identify and develop effective coping  behaviors will improve and  Compliance with prescribed medications will improve  Medication Management: RN will administer medications as ordered by provider, will assess and evaluate patient's response and provide education to patient for prescribed medication. RN will report any adverse and/or side effects to prescribing provider.  Therapeutic Interventions: 1 on 1 counseling sessions, Psychoeducation, Medication administration, Evaluate responses to treatment, Monitor vital signs and CBGs as ordered, Perform/monitor CIWA, COWS, AIMS and Fall Risk screenings as ordered, Perform wound care treatments as ordered.  Evaluation of Outcomes: Adequate for Discharge   LCSW Treatment Plan for Primary Diagnosis: Severe recurrent major depression without psychotic features (HCC) Long Term Goal(s): Safe transition to appropriate next level of care at discharge, Engage patient in therapeutic group addressing interpersonal concerns.  Short Term Goals: Engage patient in aftercare planning with referrals and resources, Increase social support, Increase emotional regulation and Increase skills for wellness and recovery  Therapeutic Interventions: Assess for all discharge needs, 1 to 1 time with Social worker, Explore available resources and support systems, Assess for adequacy in community support network, Educate family and significant other(s) on suicide prevention, Complete Psychosocial Assessment, Interpersonal group therapy.  Evaluation of Outcomes: Adequate for Discharge   Progress in Treatment: Attending groups: Yes. Participating in groups: Yes. Taking medication as prescribed: Yes. Toleration medication: Yes. Family/Significant other contact made: Yes, individual(s) contacted:  [name], [name] Patient understands diagnosis: Yes. Discussing patient identified problems/goals with staff: Yes. Medical problems stabilized or resolved: Yes. Denies suicidal/homicidal ideation:  Yes. Issues/concerns per patient self-inventory: Yes.  New problem(s) identified: No, Describe:  No additional concerns at this time  New Short Term/Long Term Goal(s): Patient goal "learn new coping skills."  Discharge Plan or Barriers: Patient to discharge home  Reason for Continuation of Hospitalization: Anxiety Depression Suicidal ideation  Estimated Length of Stay: Discharge 08/11/17  Attendees: Patient: Anne Baxter 08/11/2017 10:35 AM  Physician: Javier Dockerr.Cobos 08/11/2017 10:35 AM  Nursing: Alexia FreestonePatty, RN 08/11/2017 10:35 AM  RN Care Manager: 08/11/2017 10:35 AM  Social Worker: Garner NashGregory Wierda, CSW 08/11/2017 10:35 AM  Recreational Therapist:  08/11/2017 10:35 AM  Other: Enid Cutterharlotte Luba Matzen, Social Work Intern 08/11/2017 10:35 AM  Other:  08/11/2017 10:35 AM  Other: 08/11/2017 10:35 AM    Scribe for Treatment Team:  Darreld Mcleanharlotte C Shatora Weatherbee, Student-Social Work 08/11/2017 10:35 AM

## 2017-08-11 NOTE — Progress Notes (Signed)
D: When asked about her day pt stated, "better". Stated, "I'm better. I didn't wake up in the best of moods but I'm better now. Just ready to go home tomorrow". Pt informed the Clinical research associatewriter that a "peer spoke today", and "[facility] came to talk". Pt has no questions or concerns.    A:  Support and encouragement was offered. 15 min checks continued for safety.  R: Pt remains safe.

## 2017-09-28 ENCOUNTER — Ambulatory Visit: Payer: Medicaid Other

## 2017-11-07 ENCOUNTER — Ambulatory Visit (HOSPITAL_COMMUNITY)
Admission: RE | Admit: 2017-11-07 | Discharge: 2017-11-07 | Disposition: A | Discharge status: Home / Self Care | Payer: 59 | Attending: Psychiatry | Admitting: Psychiatry

## 2017-11-07 DIAGNOSIS — F332 Major depressive disorder, recurrent severe without psychotic features: Secondary | ICD-10-CM | POA: Insufficient documentation

## 2017-11-07 NOTE — H&P (Signed)
Behavioral Health Medical Screening Exam  Anne Baxter is an 27 y.o. female patient [facility] as [facility] with complaints of worsening depression.  Patient seeking information for therapy.  States that she has outpatient services with Dr. Jannifer FranklinAkintayo and there was a recent medication change.  Patient denies suicidal/self-harm/homicidal ideation, psychosis, and paranoia.  Reports recent stressor is the death of an aunt that she was close to and being off of her medications for a couple of days.      Total Time spent with patient: 30 minutes  Psychiatric Specialty Exam: Physical Exam  Constitutional: She is oriented to [place], place, and time. She appears well-developed and well-nourished.  Neck: Normal range of motion.  Respiratory: Effort normal.  Musculoskeletal: Normal range of motion.  Neurological: She is alert and oriented to [place], place, and time.  Skin: Skin is warm and dry.    ROS  Blood pressure (!) 145/88, pulse 94, temperature 98.9 F (37.2 C), resp. rate 18, last menstrual period 04/22/2017, SpO2 100 %, not currently breastfeeding.There is no height or weight on file to calculate BMI.  General Appearance: Casual and Neat  Eye Contact:  Good  Speech:  Clear and Coherent and Normal Rate  Volume:  Normal  Mood:  Depressed  Affect:  Appropriate and Congruent  Thought Process:  Coherent and Goal Directed  Orientation:  Full (Time, Place, and [place])  Thought Content:  Logical  Suicidal Thoughts:  No  Homicidal Thoughts:  No  Memory:  Immediate;   Good Recent;   Good Remote;   Good  Judgement:  Intact  Insight:  Present  Psychomotor Activity:  Normal  Concentration: Concentration: Good and Attention Span: Good  Recall:  Good  Fund of Knowledge:Good  Language: Good  Akathisia:  No  Handed:  Right  AIMS (if indicated):     Assets:  Communication Skills Desire for Improvement Financial Resources/Insurance Housing Physical Health  Sleep:        Musculoskeletal: Strength & Muscle Tone: within normal limits Gait & Station: normal Patient leans: N/A  Blood pressure (!) 145/88, pulse 94, temperature 98.9 F (37.2 C), resp. rate 18, last menstrual period 04/22/2017, SpO2 100 %, not currently breastfeeding.  Recommendations:   Follow up with Dr. Jannifer FranklinAkintayo; resource/referral information for outpatient therapy.  Disposition: No evidence of imminent risk to self or others at present.   Patient does not meet criteria for psychiatric inpatient admission. Discussed crisis plan, support from social network, calling 911, coming to the Emergency Department, and calling Suicide Hotline.  Based on my evaluation the patient does not appear to have an emergency medical condition.  Shuvon Rankin, NP 11/07/2017, 5:48 PM

## 2017-11-07 NOTE — BH Assessment (Signed)
Assessment Note  Anne Baxter is a 27 y.o. female who arrived to [place] by herself to talk to someone due to her concerns regarding her ability to think straight and the anxiety and depression she has been feeling lately. Pt shares that she checked herself into [facility] back in March due to suicidal thoughts and feelings and that, upon her discharge, she had learned so much and was feeling so much better that she had such high hopes for herself. She shared that this lasted for a while but that things slowly got back to normal and that things then got worse, then resulting in her aunt passing 4 days ago. Pt shares she has been able to identify her triggers and that she recognized today that she was going down the same road she went down in March so she decided to come in before she continued to get worse.  Pt denies SI, HI, NSSIB or AVH. She denies involvement in the court system and states she has no access to weapons. Pt shares she left her children's father, which she states was one of the positive choices she made for herself, as she was able to recognize while she was at [facility] that she was working hard in the relationship and her partner wasn't working to make the relationship successful at all. Pt shares she was disheartened that she moved herself and her children in with a friend, not realizing that she moved them in to a situation where her friend had not been paying the bills prior to them moving in, so they risk being evicted at any point.  Pt and clinician discussed that pt has been making choices for herself that she believes are best for herself and for her children, though at times it doesn't seem like she is making the right choices. Pt and clinician discussed that none of pt's choices are going to "fix" her problems 100%, but that even if they only improve things by 5% or 10%, that amount over time improves their lives drastically. Pt expressed understanding and agreed with this.    Pt shared a belief that she does not need to come back into the hospital today and that she just needed someone to talk to and "vent" all of the feelings she has been experiencing. Clinician expressed thinking pt could benefit from seeing a therapist on a regular basis and that, even after pt believes she has worked through the recent loss of her aunt and the recent break-up with her children's father, continuing to work with her therapist to work through the grief of the loss of her mother in 2010 could be of great benefit to her. Pt agreed that this would be of assistance to her.  Pt was oriented x4. Her recent and remote memory is intact. She was cooperative and pleasant, though she was tearful at times throughout the assessment when she discussed the death of her aunt and thoughts that negative situations and difficult things continue to happen to her. Pt's judgement, insight, and impulse control are good at this time.   Diagnosis: F33.2, Major depressive disorder, Recurrent episode, Severe   Past Medical History:  Past Medical History:  Diagnosis Date  . Anemia 2010   FE  . Depression   . Headache   . Infection 02/2017   CHLAMYDIA  . Pregnant   . Syncope   . Wrist fracture 2009 X2    Past Surgical History:  Procedure Laterality Date  . NO  PAST SURGERIES      Family History:  Family History  Problem Relation Age of Onset  . Kidney disease Mother        FAILURE  . Heart disease Mother        CHF  . Early death Mother 40  . Hypertension Mother   . Thrombophlebitis Mother   . Hyperlipidemia Maternal Grandmother   . Hypertension Maternal Grandmother     Social History:  reports that she has never smoked. She has never used smokeless tobacco. She reports that she does not drink alcohol or use drugs.  Additional Social History:  Alcohol / Drug Use Pain Medications: Please see MAR Prescriptions: Please see MAR Over the Counter: Please see MAR History of alcohol / drug  use?: No history of alcohol / drug abuse Longest period of sobriety (when/how long): N/A  CIWA: CIWA-Ar BP: (!) 145/88 Pulse Rate: 94 COWS:    Allergies: No Known Allergies  Home Medications:  (Not in a hospital admission)  OB/GYN Status:  Patient's last menstrual period was 04/22/2017 (approximate).  General Assessment Data Location of Assessment: [facility] Assessment Services TTS Assessment: In system Is this a Tele or Face-to-Face Assessment?: Face-to-Face Is this an Initial Assessment or a Re-assessment for this encounter?: Initial Assessment Marital status: Single Maiden name: Coomes Is patient pregnant?: No Pregnancy Status: No Living Arrangements: Children, Non-relatives/Friends Can pt return to current living arrangement?: Yes Admission Status: Voluntary Is patient capable of signing voluntary admission?: Yes Referral Source: Self/Family/Friend Insurance type: Medicaid and Facilities manager Exam ([place]) Medical Exam completed: Yes  Crisis Care Plan Living Arrangements: Children, Non-relatives/Friends Legal Guardian: Other:(N/A) Name of Psychiatrist: Dr. Darleene Cleaver Name of Therapist: N/A  Education Status Is patient currently in school?: No Is the patient employed, unemployed or receiving disability?: Employed  Risk to self with the past 6 months Suicidal Ideation: No Has patient been a risk to self within the past 6 months prior to admission? : Yes Suicidal Intent: No Has patient had any suicidal intent within the past 6 months prior to admission? : Yes Is patient at risk for suicide?: No Suicidal Plan?: No Has patient had any suicidal plan within the past 6 months prior to admission? : Yes Access to Means: No What has been your use of drugs/alcohol within the last 12 months?: Pt denies Previous Attempts/Gestures: No How many times?: 0 Other Self Harm Risks: None noted Triggers for Past Attempts: Unpredictable, Other personal  contacts, Anniversary Intentional Self Injurious Behavior: None Family Suicide History: No Recent stressful life event(s): Loss (Comment), Conflict (Comment)(Recently separated from kids' father; aunt died on 11-13-17) Persecutory voices/beliefs?: No Depression: Yes Depression Symptoms: Despondent, Tearfulness, Fatigue, Guilt, Feeling worthless/self pity, Loss of interest in usual pleasures Substance abuse history and/or treatment for substance abuse?: No Suicide prevention information given to non-admitted patients: Yes  Risk to Others within the past 6 months Homicidal Ideation: No Does patient have any lifetime risk of violence toward others beyond the six months prior to admission? : No Thoughts of Harm to Others: No Current Homicidal Intent: No Current Homicidal Plan: No Access to Homicidal Means: No Identified Victim: N/A History of harm to others?: No Assessment of Violence: On admission Violent Behavior Description: None noted Does patient have access to weapons?: No(Pt denied) Criminal Charges Pending?: No Does patient have a court date: No Is patient on probation?: No  Psychosis Hallucinations: None noted Delusions: None noted  Mental Status Report Appearance/Hygiene: Unremarkable Eye Contact: [place] Motor  Activity: Unremarkable Speech: Soft, Unremarkable Level of Consciousness: Quiet/awake, Crying Mood: Depressed, Empty, Sad Affect: Depressed, Sad, Sullen Anxiety Level: Minimal Thought Processes: Coherent, Relevant Judgement: Unimpaired Orientation: [place], Place, Time, Situation Obsessive Compulsive Thoughts/Behaviors: Minimal  Cognitive Functioning Concentration: Normal Memory: Recent Intact, Remote Intact Is patient IDD: No Is patient DD?: No Insight: Good Impulse Control: Fair Appetite: Fair Have you had any weight changes? : Loss Amount of the weight change? (lbs): 4 lbs(4 lbs lost in the last month) Sleep: No Change Total Hours of Sleep:  6 Vegetative Symptoms: None  ADLScreening [facility] Assessment Services) Patient's cognitive ability adequate to safely complete daily activities?: Yes Patient able to express need for assistance with ADLs?: Yes Independently performs ADLs?: Yes (appropriate for developmental age)  Prior Inpatient Therapy Prior Inpatient Therapy: Yes Prior Therapy Dates: 07/2017 Prior Therapy Facilty/Provider([facility]): [facility] Reason for Treatment: SI  Prior Outpatient Therapy Prior Outpatient Therapy: Yes Prior Therapy Dates: 07/2017 Prior Therapy Facilty/Provider(s): Unknown - through pt's EAP Reason for Treatment: SI/Depression Does patient have an ACCT team?: No Does patient have Intensive In-House Services?  : No Does patient have [facility] services? : No Does patient have P4CC services?: No  ADL Screening (condition at time of admission) Patient's cognitive ability adequate to safely complete daily activities?: Yes Is the patient deaf or have difficulty hearing?: No Does the patient have difficulty seeing, even when wearing glasses/contacts?: No Does the patient have difficulty concentrating, remembering, or making decisions?: No Patient able to express need for assistance with ADLs?: Yes Does the patient have difficulty dressing or bathing?: No Independently performs ADLs?: Yes (appropriate for developmental age) Does the patient have difficulty walking or climbing stairs?: No       Abuse/Neglect Assessment (Assessment to be complete while patient is alone) Abuse/Neglect Assessment Can Be Completed: Yes Physical Abuse: Denies Verbal Abuse: Denies Sexual Abuse: Denies Exploitation of patient/patient's resources: Denies Self-Neglect: Denies Values / Beliefs Cultural Requests During Hospitalization: None Spiritual Requests During Hospitalization: None Consults Spiritual Care Consult Needed: No Social Work Consult Needed: No Regulatory affairs officer (For Healthcare) Does Patient Have a Medical  Advance Directive?: No Would patient like information on creating a medical advance directive?: No - Patient declined    Additional Information 1:1 In Past 12 Months?: No CIRT Risk: No Elopement Risk: No Does patient have medical clearance?: Yes     Disposition: Shuvon Rankin NP reviewed pt's information and met with pt and determined that pt does not meet inpatient hospitalization criteria. Shuvon recommended pt follow-up with her psychiatrist's office to determine if she can begin seeing a therapist there and, if not, seek a therapist elsewhere. Pt was provided a list of outpatient service providers.  Disposition Initial Assessment Completed for this Encounter: Yes Disposition of Patient: Discharge(Shuvon Rankin NP determined pt doesn't meet inpt criteria) Patient refused recommended treatment: No Mode of transportation if patient is discharged?: Car Patient referred to: Other (Comment)(Pt provided list of providers for outpatient services)  On Site Evaluation by:   Reviewed with Physician:    Dannielle Burn 11/07/2017 7:06 PM

## 2017-11-30 ENCOUNTER — Encounter (HOSPITAL_COMMUNITY): Payer: Self-pay | Admitting: Emergency Medicine

## 2017-11-30 ENCOUNTER — Inpatient Hospital Stay (HOSPITAL_COMMUNITY)
Admission: RE | Admit: 2017-11-30 | Discharge: 2017-12-05 | DRG: 885 | Disposition: A | Discharge status: Home / Self Care | Payer: 59 | Attending: Psychiatry | Admitting: Psychiatry

## 2017-11-30 ENCOUNTER — Other Ambulatory Visit: Payer: Self-pay

## 2017-11-30 DIAGNOSIS — R45851 Suicidal ideations: Secondary | ICD-10-CM | POA: Diagnosis not present

## 2017-11-30 DIAGNOSIS — Z841 Family history of disorders of kidney and ureter: Secondary | ICD-10-CM

## 2017-11-30 DIAGNOSIS — F332 Major depressive disorder, recurrent severe without psychotic features: Principal | ICD-10-CM | POA: Diagnosis present

## 2017-11-30 DIAGNOSIS — Z630 Problems in relationship with spouse or partner: Secondary | ICD-10-CM | POA: Diagnosis not present

## 2017-11-30 DIAGNOSIS — F419 Anxiety disorder, unspecified: Secondary | ICD-10-CM | POA: Diagnosis not present

## 2017-11-30 DIAGNOSIS — Z8249 Family history of ischemic heart disease and other diseases of the circulatory system: Secondary | ICD-10-CM

## 2017-11-30 DIAGNOSIS — Z560 Unemployment, unspecified: Secondary | ICD-10-CM | POA: Diagnosis not present

## 2017-11-30 DIAGNOSIS — Z634 Disappearance and death of family member: Secondary | ICD-10-CM | POA: Diagnosis not present

## 2017-11-30 DIAGNOSIS — F411 Generalized anxiety disorder: Secondary | ICD-10-CM | POA: Diagnosis present

## 2017-11-30 DIAGNOSIS — F329 Major depressive disorder, single episode, unspecified: Secondary | ICD-10-CM | POA: Diagnosis present

## 2017-11-30 DIAGNOSIS — G4700 Insomnia, unspecified: Secondary | ICD-10-CM | POA: Diagnosis present

## 2017-11-30 DIAGNOSIS — Z8349 Family history of other endocrine, nutritional and metabolic diseases: Secondary | ICD-10-CM | POA: Diagnosis not present

## 2017-11-30 MED ORDER — PROPRANOLOL HCL 10 MG PO TABS
10.0000 mg | ORAL_TABLET | Freq: Two times a day (BID) | ORAL | Status: DC
Start: 2017-11-30 — End: 2017-12-05
  Administered 2017-11-30 – 2017-12-05 (×10): 10 mg via ORAL
  Filled 2017-11-30 (×16): qty 1

## 2017-11-30 MED ORDER — ALUM & MAG HYDROXIDE-SIMETH 200-200-20 MG/5ML PO SUSP: 30.0000 mL | ORAL | Status: DC | PRN

## 2017-11-30 MED ORDER — MIRTAZAPINE 7.5 MG PO TABS
7.5000 mg | ORAL_TABLET | Freq: Every evening | ORAL | Status: DC | PRN
  Administered 2017-12-01: 7.5 mg via ORAL
  Filled 2017-11-30: qty 1

## 2017-11-30 MED ORDER — TRAZODONE HCL 100 MG PO TABS: 100.0000 mg | ORAL_TABLET | Freq: Every evening | ORAL | Status: DC | PRN

## 2017-11-30 MED ORDER — CITALOPRAM HYDROBROMIDE 20 MG PO TABS
20.0000 mg | ORAL_TABLET | Freq: Every day | ORAL | Status: DC
  Administered 2017-11-30 – 2017-12-02 (×3): 20 mg via ORAL
  Filled 2017-11-30 (×7): qty 1

## 2017-11-30 MED ORDER — ACETAMINOPHEN 325 MG PO TABS
650.0000 mg | ORAL_TABLET | Freq: Four times a day (QID) | ORAL | Status: DC | PRN
  Administered 2017-12-04 – 2017-12-05 (×3): 650 mg via ORAL
  Filled 2017-11-30 (×3): qty 2

## 2017-11-30 NOTE — Tx Team (Signed)
Initial Treatment Plan 11/30/2017 5:47 PM Anne Baxter RUE:[id number]    PATIENT STRESSORS: Financial difficulties Marital or family conflict Occupational concerns   PATIENT STRENGTHS: Ability for insight Wellsite geologistCommunication skills General fund of knowledge Motivation for treatment/growth   PATIENT IDENTIFIED PROBLEMS: "I have been feeling depressed"  "I had thoughts of crashing into my ex before being admitted"  "I have a lot of things stressing me"                 DISCHARGE CRITERIA:  Improved stabilization in mood, thinking, and/or behavior Verbal commitment to aftercare and medication compliance  PRELIMINARY DISCHARGE PLAN: Return to previous living arrangement  PATIENT/FAMILY INVOLVEMENT: This treatment plan has been presented to and reviewed with the patient, Anne Baxter, and/or family member,   The patient and family have been given the opportunity to ask questions and make suggestions.  Jerrye BushyLaRonica R Alyrica Thurow, RN 11/30/2017, 5:47 PM
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
## 2017-11-30 NOTE — Progress Notes (Signed)
Patient ID: Anne Baxter, female   DOB: February 20, 1991, 27 y.o.   MRN: [id number] Patient admitted to the unit due to decreased mood, increased depression and passive death wish.  Patient stated she has become increasingly depressed due to recent loss of a parental figure, loss of job, and of a stable living environment.  Patient noted to be flat, spoke softly and admitted     Safety search complete patient found to be free of injury.  Patient also found to be free of all contraband.  Patient admitted and oriented to the unit without incident.

## 2017-11-30 NOTE — Progress Notes (Signed)
Adult Psychoeducational Group Note  Date:  11/30/2017 Time:  9:14 PM  Group Topic/Focus:  Building Self Esteem:   The Focus of this group is helping patients become aware of the effects of self-esteem on their lives, the things they and others do that enhance or undermine their self-esteem, seeing the relationship between their level of self-esteem and the choices they make and learning ways to enhance self-esteem. Wrap-Up Group:   The focus of this group is to help patients review their daily goal of treatment and discuss progress on daily workbooks.  Participation Level:  Active  Participation Quality:  Appropriate  Affect:  Appropriate  Cognitive:  Alert and Oriented  Insight: Appropriate  Engagement in Group:  Improving  Modes of Intervention:  Exploration and Support  Additional Comments:  Pt verbalized that her day started as a 1 but now is a 5.  Pt verbalized that something positive is that she was able to admit herself. Pt verbalized that a coping skill that she wants to use is asking for help when she knows she needs it. Pt verbalized that two positive supports are her 2 kids and her best friend.  Kritika Stukes, Randal Bubaerri Lee 11/30/2017, 9:14 PM

## 2017-11-30 NOTE — Plan of Care (Signed)
D: Pt denies SI/HI/AV hallucinations. Patient observed in milieu interacting with peers. Patient has been pleasant and cooperative. Her affect is flat.  A: Pt was offered support and encouragement. Pt was given scheduled medications. Pt was encourage to attend groups. Q 15 minute checks were done for safety.  R:Pt attends groups and interacts well with peers and staff. Pt is taking medication. Pt has no complaints.Pt receptive to treatment and safety maintained on unit.    Problem: Safety: Goal: Periods of time without injury will increase Outcome: Progressing Note:  Patient denies SI and remains safe on unit.

## 2017-11-30 NOTE — [facility] Group Notes (Signed)
[facility] Group Notes:  (Nursing/MHT/Case Management/Adjunct)  Date:  11/30/2017  Time:  5:00 PM  Type of Therapy:  Psychoeducational Skills  Participation Level:  Did Not Attend    Anne Baxter Anne Baxter 11/30/2017, 5:00 PM 

## 2017-12-01 DIAGNOSIS — Z634 Disappearance and death of family member: Secondary | ICD-10-CM

## 2017-12-01 DIAGNOSIS — F332 Major depressive disorder, recurrent severe without psychotic features: Principal | ICD-10-CM

## 2017-12-01 DIAGNOSIS — Z630 Problems in relationship with spouse or partner: Secondary | ICD-10-CM

## 2017-12-01 DIAGNOSIS — Z560 Unemployment, unspecified: Secondary | ICD-10-CM

## 2017-12-01 LAB — CBC WITH DIFFERENTIAL/PLATELET
Basophils Absolute: 0 10*3/uL (ref 0.0–0.1)
Basophils Relative: 1 %
Eosinophils Absolute: 0.1 10*3/uL (ref 0.0–0.7)
Eosinophils Relative: 1 %
HCT: 32.3 % — ABNORMAL LOW (ref 36.0–46.0)
Hemoglobin: 10.5 g/dL — ABNORMAL LOW (ref 12.0–15.0)
Lymphocytes Relative: 37 %
Lymphs Abs: 1.7 10*3/uL (ref 0.7–4.0)
MCH: 24.3 pg — ABNORMAL LOW (ref 26.0–34.0)
MCHC: 32.5 g/dL (ref 30.0–36.0)
MCV: 74.8 fL — ABNORMAL LOW (ref 78.0–100.0)
Monocytes Absolute: 0.6 10*3/uL (ref 0.1–1.0)
Monocytes Relative: 14 %
Neutro Abs: 2.1 10*3/uL (ref 1.7–7.7)
Neutrophils Relative %: 47 %
Platelets: 338 10*3/uL (ref 150–400)
RBC: 4.32 MIL/uL (ref 3.87–5.11)
RDW: 14.5 % (ref 11.5–15.5)
WBC: 4.5 10*3/uL (ref 4.0–10.5)

## 2017-12-01 LAB — LIPID PANEL
Cholesterol: 151 mg/dL (ref 0–200)
HDL: 72 mg/dL (ref >40)
LDL Cholesterol: 74 mg/dL (ref 0–99)
Total CHOL/HDL Ratio: 2.1 RATIO
Triglycerides: 23 mg/dL (ref <150)
VLDL: 5 mg/dL (ref 0–40)

## 2017-12-01 LAB — URINALYSIS, ROUTINE W REFLEX MICROSCOPIC
Bilirubin Urine: NEGATIVE
Glucose, UA: NEGATIVE mg/dL
Hgb urine dipstick: NEGATIVE
Ketones, ur: NEGATIVE mg/dL
Leukocytes, UA: NEGATIVE
Nitrite: NEGATIVE
Protein, ur: NEGATIVE mg/dL
Specific Gravity, Urine: 1.003 — ABNORMAL LOW (ref 1.005–1.030)
pH: 6 (ref 5.0–8.0)

## 2017-12-01 LAB — COMPREHENSIVE METABOLIC PANEL
ALT: 9 U/L (ref 0–44)
AST: 14 U/L — ABNORMAL LOW (ref 15–41)
Albumin: 3.9 g/dL (ref 3.5–5.0)
Alkaline Phosphatase: 40 U/L (ref 38–126)
Anion gap: 7 (ref 5–15)
BUN: 11 mg/dL (ref 6–20)
CO2: 26 mmol/L (ref 22–32)
Calcium: 9.1 mg/dL (ref 8.9–10.3)
Chloride: 104 mmol/L (ref 98–111)
Creatinine, Ser: 0.66 mg/dL (ref 0.44–1.00)
GFR calc Af Amer: >60 mL/min (ref >60)
GFR calc non Af Amer: >60 mL/min (ref >60)
Glucose, Bld: 89 mg/dL (ref 70–99)
Potassium: 4.1 mmol/L (ref 3.5–5.1)
Sodium: 137 mmol/L (ref 135–145)
Total Bilirubin: 1 mg/dL (ref 0.3–1.2)
Total Protein: 6.8 g/dL (ref 6.5–8.1)

## 2017-12-01 LAB — RAPID URINE DRUG SCREEN, HOSP PERFORMED
Amphetamines: NOT DETECTED
Benzodiazepines: NOT DETECTED
Cocaine: NOT DETECTED
Opiates: NOT DETECTED
Tetrahydrocannabinol: POSITIVE — AB

## 2017-12-01 LAB — PREGNANCY, URINE: Preg Test, Ur: NEGATIVE

## 2017-12-01 LAB — HEMOGLOBIN A1C
Hgb A1c MFr Bld: 5.5 % (ref 4.8–5.6)
Mean Plasma Glucose: 111.15 mg/dL

## 2017-12-01 LAB — TSH: TSH: 0.959 u[IU]/mL (ref 0.350–4.500)

## 2017-12-01 NOTE — Plan of Care (Signed)
  Problem: Education: Goal: Emotional status will improve Outcome: Progressing Goal: Mental status will improve Outcome: Progressing   

## 2017-12-01 NOTE — [facility] Suicide Risk Assessment (Signed)
[facility] Ad[facility] Suicide [facility]   Nursing information obtained from:  Patient Demographic factors:  Unemployed Current Mental Status:  Suicidal ideation indicated by patient Loss Factors:  Decrease in vocational status, Loss of significant relationship Historical Factors:  NA Risk Reduction Factors:  Responsible for children under 27 years of age  Total Time spent with patient: 45 minutes Principal Problem:  MDD Diagnosis:   Patient Active Problem List   Diagnosis Date Noted  . MDD (major depressive disorder) [F32.9] 11/30/2017  . Severe recurrent major depression without psychotic features (HCC) [F33.2] 08/07/2017  . Pregnant [Z34.90] 08/07/2017  . Depression [F32.9] 09/20/2016  . History of migraine headaches [Z86.69] 03/16/2016   Subjective Data:  Continued Clinical Symptoms:  Alcohol Use Disorder Identification Test Final Score (AUDIT): 0 The "Alcohol Use Disorders Identification Test", Guidelines for Use in Primary Care, Second Edition.  World Science writerHealth Organization [facility](WHO). Score between 0-7:  no or low risk or alcohol related problems. Score between 8-15:  moderate risk of alcohol related problems. Score between 16-19:  high risk of alcohol related problems. Score 20 or above:  warrants further diagnostic evaluation for alcohol dependence and treatment.   CLINICAL FACTORS:  27 year old female, presents for worsening depression, suicidal thoughts of crashing her car, describes relationship stressors, recent death of an aunt, unemployment, as significant contributors to worsening mood.   Psychiatric Specialty Exam: Physical Exam  ROS  Blood pressure 121/83, pulse 98, temperature 98.8 F (37.1 C), temperature source Oral, resp. rate 12, height 5\' 6"  (1.676 m), weight 54.4 kg (120 lb), last menstrual period 04/22/2017, SpO2 100 %, unknown if currently breastfeeding.Body mass index is 19.37 kg/m.  See admit note mental status exam   COGNITIVE FEATURES THAT CONTRIBUTE  TO RISK:  No gross cognitive deficits noted upon discharge. Is alert , attentive, and oriented x 3    SUICIDE RISK:  Moderate  PLAN OF CARE: Patient will be admitted to inpatient psychiatric unit for stabilization and safety. Will provide and encourage milieu participation. Provide medication management and maked adjustments as needed.  Will follow daily.    I certify that inpatient services furnished can reasonably be expected to improve the patient's condition.   Craige CottaFernando A Xan Sparkman, MD 12/01/2017, 3:51 PM

## 2017-12-01 NOTE — Tx Team (Addendum)
Interdisciplinary Treatment and Diagnostic Plan Update  12/01/2017 Time of Session: 1054 Anne Baxter MRN: [id number]  Principal Diagnosis: <principal problem not specified>  Secondary Diagnoses: Active Problems:   MDD (major depressive disorder)   Current Medications:  Current Facility-Administered Medications  Medication Dose Route Frequency Provider Last Rate Last Dose  . acetaminophen (TYLENOL) tablet 650 mg  650 mg Oral Q6H PRN Mordecai Maes, NP      . alum & mag hydroxide-simeth (MAALOX/MYLANTA) 200-200-20 MG/5ML suspension 30 mL  30 mL Oral Q4H PRN Mordecai Maes, NP      . citalopram (CELEXA) tablet 20 mg  20 mg Oral Daily Rankin, Shuvon B, NP   20 mg at 12/01/17 0825  . mirtazapine (REMERON) tablet 7.5 mg  7.5 mg Oral QHS PRN Rankin, Shuvon B, NP      . propranolol (INDERAL) tablet 10 mg  10 mg Oral BID Rankin, Shuvon B, NP   10 mg at 12/01/17 2458   PTA Medications: Medications Prior to Admission  Medication Sig Dispense Refill Last Dose  . citalopram (CELEXA) 10 MG tablet Take 1 tablet (10 mg total) by mouth daily. (Patient taking differently: Take 20 mg by mouth daily. ) 30 tablet 0   . mirtazapine (REMERON) 7.5 MG tablet Take 7.5 mg by mouth at bedtime as needed for depression or sleep.  1   . propranolol (INDERAL) 10 MG tablet Take 10 mg by mouth 2 (two) times daily.  1   . traZODone (DESYREL) 50 MG tablet Take 1 tablet (50 mg total) by mouth at bedtime as needed for sleep. (Patient taking differently: Take 100 mg by mouth at bedtime as needed for sleep. ) 30 tablet 0     Patient Stressors: Financial difficulties Marital or family conflict Occupational concerns  Patient Strengths: Ability for Engineer, manufacturing fund of knowledge Motivation for treatment/growth  Treatment Modalities: Medication Management, Group therapy, Case management,  1 to 1 session with clinician, Psychoeducation, Recreational therapy.   Physician Treatment Plan for  Primary Diagnosis: <principal problem not specified> Long Term Goal(s): Improvement in symptoms so as ready for discharge Improvement in symptoms so as ready for discharge   Short Term Goals: Ability to identify changes in lifestyle to reduce recurrence of condition will improve Ability to maintain clinical measurements within normal limits will improve Ability to identify changes in lifestyle to reduce recurrence of condition will improve Ability to identify and develop effective coping behaviors will improve  Medication Management: Evaluate patient's response, side effects, and tolerance of medication regimen.  Therapeutic Interventions: 1 to 1 sessions, Unit Group sessions and Medication administration.  Evaluation of Outcomes: Not Met  Physician Treatment Plan for Secondary Diagnosis: Active Problems:   MDD (major depressive disorder)  Long Term Goal(s): Improvement in symptoms so as ready for discharge Improvement in symptoms so as ready for discharge   Short Term Goals: Ability to identify changes in lifestyle to reduce recurrence of condition will improve Ability to maintain clinical measurements within normal limits will improve Ability to identify changes in lifestyle to reduce recurrence of condition will improve Ability to identify and develop effective coping behaviors will improve     Medication Management: Evaluate patient's response, side effects, and tolerance of medication regimen.  Therapeutic Interventions: 1 to 1 sessions, Unit Group sessions and Medication administration.  Evaluation of Outcomes: Not Met   RN Treatment Plan for Primary Diagnosis: <principal problem not specified> Long Term Goal(s): Knowledge of disease and therapeutic regimen to maintain health will  improve  Short Term Goals: Ability to identify and develop effective coping behaviors will improve and Compliance with prescribed medications will improve  Medication Management: RN will  administer medications as ordered by provider, will assess and evaluate patient's response and provide education to patient for prescribed medication. RN will report any adverse and/or side effects to prescribing provider.  Therapeutic Interventions: 1 on 1 counseling sessions, Psychoeducation, Medication administration, Evaluate responses to treatment, Monitor vital signs and CBGs as ordered, Perform/monitor CIWA, COWS, AIMS and Fall Risk screenings as ordered, Perform wound care treatments as ordered.  Evaluation of Outcomes: Not Met   LCSW Treatment Plan for Primary Diagnosis: <principal problem not specified> Long Term Goal(s): Safe transition to appropriate next level of care at discharge, Engage patient in therapeutic group addressing interpersonal concerns.  Short Term Goals: Engage patient in aftercare planning with referrals and resources, Increase social support and Increase skills for wellness and recovery  Therapeutic Interventions: Assess for all discharge needs, 1 to 1 time with Social worker, Explore available resources and support systems, Assess for adequacy in community support network, Educate family and significant other(s) on suicide prevention, Complete Psychosocial Assessment, Interpersonal group therapy.  Evaluation of Outcomes: Not Met   Progress in Treatment: Attending groups: No. Participating in groups: No. Taking medication as prescribed: Yes. Toleration medication: Yes. Family/Significant other contact made: No, will contact:  when given permission Patient understands diagnosis: Yes. Discussing patient identified problems/goals with staff: Yes. Medical problems stabilized or resolved: Yes. Denies suicidal/homicidal ideation: Yes. Issues/concerns per patient self-inventory: No Other: none  New problem(s) identified: No, Describe:  none  New Short Term/Long Term Goal(s):  Patient Goals:  "To get better."  Discharge Plan or Barriers:   Reason for  Continuation of Hospitalization: Depression Medication stabilization  Estimated Length of Stay: 3-5 days.  Attendees: Patient:Anne Baxter 12/01/2017   Physician: Dr Parke Poisson, MD 12/01/2017   Nursing: Mayra Neer, RN 12/01/2017   RN Care Manager: 12/01/2017   Social Worker: Lurline Idol, LCSW 12/01/2017   Recreational Therapist:  12/01/2017   Other:  12/01/2017   Other:  12/01/2017   Other: 12/01/2017        Scribe for Treatment Team: Joanne Chars, LCSW 12/01/2017 3:13 PM

## 2017-12-01 NOTE — H&P (Signed)
Psychiatric Admission Assessment Adult  Patient Identification: Anne Baxter MRN:  [id number] Date of Evaluation:  12/01/2017 Chief Complaint:  " I had suicidal thoughts " Principal Diagnosis:  MDD, no psychotic features  Diagnosis:   Patient Active Problem List   Diagnosis Date Noted  . MDD (major depressive disorder) [F32.9] 11/30/2017  . Severe recurrent major depression without psychotic features ([place]) [F33.2] 08/07/2017  . Pregnant [Z34.90] 08/07/2017  . Depression [F32.9] 09/20/2016  . History of migraine headaches [Z86.69] 03/16/2016   History of Present Illness: 27 year old female. Presented to the hospital voluntarily , reporting worsening depression, anxiety particularly over the last two weeks . She reports she developed suicidal ideations earlier this week, with thoughts of crashing her car into her BF's car . She also reports vague violent but not homicidal ideations towards her BF, states " I would like to hurt him , no kill him", but denies current plan or intention .  Identifies several psychosocial stressors- reports recent break up with BF, death of an aunt , recent loss of employment have been major stressors. Endorses neuro-vegetative symptoms of depression as below. Denies psychotic symptoms. Associated Signs/Symptoms: Depression Symptoms:  depressed mood, anhedonia, insomnia, suicidal thoughts with specific plan, loss of energy/fatigue, decreased appetite, (Hypo) Manic Symptoms:  Some irritability. No symptoms of mania noted at this time.  Anxiety Symptoms: reports increased anxiety, worry recently  Psychotic Symptoms:  Denies  PTSD Symptoms: Does not endorse - reports some nightmares and recollections associated with death of mother when patient was 71, but does not endorse current significant symptoms of PTSD. Total Time spent with patient: 45 minutes  Past Psychiatric History: reports history of depression, anxiety. States she feels her depression has been  chronic and " going on for years ".  Also describes a tendency to worry excessively. Describes occasional panic attacks.  Denies history of psychosis.  Denies history of suicide attempts, denies history of self cutting, denies history of violence. History of prior psychiatric admission in March 2019,  for similar circumstances ( depression, SI in the context of interpersonal stressors). At the time was discharged on Celexa .   Is the patient at risk to self? Yes.    Has the patient been a risk to self in the past 6 months? Yes.    Has the patient been a risk to self within the distant past? No.  Is the patient a risk to others? Yes.    Has the patient been a risk to others in the past 6 months? No.  Has the patient been a risk to others within the distant past? No.   Prior Inpatient Therapy: Prior Inpatient Therapy: Yes Prior Therapy Dates: 07/2017 Prior Therapy [facility]ilty[facility]Provider([facility]): [facility] Reason for Treatment: SI Prior Outpatient Therapy: Follows up with Dr. Darleene Cleaver for outpatient treatment. Does not currently have an outpatient therapist .  Alcohol Screening: 1. How often do you have a drink containing alcohol?: Never 2. How many drinks containing alcohol do you have on a typical day when you are drinking?: 1 or 2 3. How often do you have six or more drinks on one occasion?: Never AUDIT-C Score: 0 4. How often during the last year have you found that you were not able to stop drinking once you had started?: Never 5. How often during the last year have you failed to do what was normally expected from you becasue of drinking?: Never 6. How often during the last year have you needed a first drink  in the morning to get yourself going after a heavy drinking session?: Never 7. How often during the last year have you had a feeling of guilt of remorse after drinking?: Never 8. How often during the last year have you been unable to remember what happened the night before because you had  been drinking?: Never 9. Have you or someone else been injured as a result of your drinking?: No 10. Has a relative or friend or a doctor or another health worker been concerned about your drinking or suggested you cut down?: No Alcohol Use Disorder Identification Test Final Score (AUDIT): 0 Substance Abuse History in the last 12 months:  Denies alcohol abuse, reports regular cannabis use but not over the last several days, no other drug abuse  Consequences of Substance Abuse: Denies  Previous Psychotropic Medications: was not taking any psychiatric medications before admission. In the past has been managed with Celexa . States she tolerated Celexa well at lower doses, but higher doses resulted in increased frequency of headache. Psychological Evaluations:  No  Past Medical History: denies medical illnesses  Past Medical History:  Diagnosis Date  . Anemia 07/09/2008   FE  . Depression   . Headache   . Infection 02/2017   CHLAMYDIA  . Pregnant   . Syncope   . Wrist fracture 2009 X2    Past Surgical History:  Procedure Laterality Date  . NO PAST SURGERIES     Family History: mother died in 2008-07-09, father alive, but distant relationship, two sisters  Family History  Problem Relation Age of Onset  . Kidney disease Mother        FAILURE  . Heart disease Mother        CHF  . Early death Mother 41  . Hypertension Mother   . Thrombophlebitis Mother   . Hyperlipidemia Maternal Grandmother   . Hypertension Maternal Grandmother    Family Psychiatric  History: denies family psychiatric history, denies history of suicides in family  Tobacco Screening:  does not smoke or use tobacco products  Social History: 27 year old female, single, has two children ( 5,1) , lives with a cousin, currently unemployed . Social History   Substance and Sexual Activity  Alcohol Use No  . Alcohol/week: 0.0 oz   Comment: RARELY     Social History   Substance and Sexual Activity  Drug Use No    Additional  Social History: Marital status: Single    Pain Medications: Please see MAR Prescriptions: Please see MAR Over the Counter: Please see MAR History of alcohol / drug use?: No history of alcohol / drug abuse Longest period of sobriety (when/how long): N/A  Allergies:  No Known Allergies Lab Results:  Results for orders placed or performed during the hospital encounter of 11/30/17 (from the past 48 hour(s))  Urinalysis, Routine w reflex microscopic     Status: Abnormal   Collection Time: 11/30/17  1:53 PM  Result Value Ref Range   Color, Urine COLORLESS (A) YELLOW   APPearance CLEAR CLEAR   Specific Gravity, Urine 1.003 (L) 1.005 - 1.030   pH 6.0 5.0 - 8.0   Glucose, UA NEGATIVE NEGATIVE mg/dL   Hgb urine dipstick NEGATIVE NEGATIVE   Bilirubin Urine NEGATIVE NEGATIVE   Ketones, ur NEGATIVE NEGATIVE mg/dL   Protein, ur NEGATIVE NEGATIVE mg/dL   Nitrite NEGATIVE NEGATIVE   Leukocytes, UA NEGATIVE NEGATIVE    Comment: Performed at [facility], [place] [address], [place], [place] [address]  Pregnancy, urine     Status: None   Collection Time: 11/30/17  1:53 PM  Result Value Ref Range   Preg Test, Ur NEGATIVE NEGATIVE    Comment:        THE SENSITIVITY OF THIS METHODOLOGY IS >20 mIU/mL. Performed at [facility], [place] [address], [place], [place] [address]   Urine rapid drug screen (hosp performed)     Status: Abnormal   Collection Time: 11/30/17  2:15 PM  Result Value Ref Range   Opiates NONE DETECTED NONE DETECTED   Cocaine NONE DETECTED NONE DETECTED   Benzodiazepines NONE DETECTED NONE DETECTED   Amphetamines NONE DETECTED NONE DETECTED   Tetrahydrocannabinol POSITIVE (A) NONE DETECTED   Barbiturates (A) NONE DETECTED    Result not available. Reagent lot number recalled by manufacturer.    Comment: Performed at [facility], [place] [address] [place], [place] [address]  TSH     Status: None   Collection Time: 12/01/17   6:28 AM  Result Value Ref Range   TSH 0.959 0.350 - 4.500 uIU/mL    Comment: Performed by a 3rd Generation assay with a functional sensitivity of <=0.01 uIU/mL. Performed at [facility], [place] [address] [place], [place] [address]   Hemoglobin A1c     Status: None   Collection Time: 12/01/17  6:28 AM  Result Value Ref Range   Hgb A1c MFr Bld 5.5 4.8 - 5.6 %    Comment: (NOTE) Pre diabetes:          5.7%-6.4% Diabetes:              >6.4% Glycemic control for   <7.0% adults with diabetes    Mean Plasma Glucose 111.15 mg/dL    Comment: Performed at [place] [address], [place],  [address]  Lipid panel     Status: None   Collection Time: 12/01/17  6:28 AM  Result Value Ref Range   Cholesterol 151 0 - 200 mg/dL   Triglycerides 23 <150 mg/dL   HDL 72 >40 mg/dL   Total CHOL/HDL Ratio 2.1 RATIO   VLDL 5 0 - 40 mg/dL   LDL Cholesterol 74 0 - 99 mg/dL    Comment:        Total Cholesterol/HDL:CHD Risk Coronary Heart Disease Risk Table                     Men   Women  1/2 Average Risk   3.4   3.3  Average Risk       5.0   4.4  2 X Average Risk   9.6   7.1  3 X Average Risk  23.4   11.0        Use the calculated Patient Ratio above and the CHD Risk Table to determine the patient's CHD Risk.        ATP III CLASSIFICATION (LDL):  <100     mg/dL   Optimal  100-129  mg/dL   Near or Above                    Optimal  130-159  mg/dL   Borderline  160-189  mg/dL   High  >190     mg/dL   Very High Performed at [place] [address] [place],  [address]   CBC with Differential/Platelet     Status: Abnormal   Collection Time: 12/01/17  6:28  AM  Result Value Ref Range   WBC 4.5 4.0 - 10.5 K/uL   RBC 4.32 3.87 - 5.11 MIL/uL   Hemoglobin 10.5 (L) 12.0 - 15.0 g/dL   HCT 32.3 (L) 36.0 - 46.0 %   MCV 74.8 (L) 78.0 - 100.0 fL   MCH 24.3 (L) 26.0 - 34.0 pg   MCHC 32.5 30.0 - 36.0 g/dL   RDW 14.5 11.5 - 15.5 %    Platelets 338 150 - 400 K/uL   Neutrophils Relative % 47 %   Neutro Abs 2.1 1.7 - 7.7 K/uL   Lymphocytes Relative 37 %   Lymphs Abs 1.7 0.7 - 4.0 K/uL   Monocytes Relative 14 %   Monocytes Absolute 0.6 0.1 - 1.0 K/uL   Eosinophils Relative 1 %   Eosinophils Absolute 0.1 0.0 - 0.7 K/uL   Basophils Relative 1 %   Basophils Absolute 0.0 0.0 - 0.1 K/uL   RBC Morphology TARGET CELLS     Comment: Performed at [facility], [place] [address] [place], [place] [address]  Comprehensive metabolic panel     Status: Abnormal   Collection Time: 12/01/17  6:28 AM  Result Value Ref Range   Sodium 137 135 - 145 mmol/L   Potassium 4.1 3.5 - 5.1 mmol/L   Chloride 104 98 - 111 mmol/L    Comment: Please note change in reference range.   CO2 26 22 - 32 mmol/L   Glucose, Bld 89 70 - 99 mg/dL    Comment: Please note change in reference range.   BUN 11 6 - 20 mg/dL    Comment: Please note change in reference range.   Creatinine, Ser 0.66 0.44 - 1.00 mg/dL   Calcium 9.1 8.9 - 10.3 mg/dL   Total Protein 6.8 6.5 - 8.1 g/dL   Albumin 3.9 3.5 - 5.0 g/dL   AST 14 (L) 15 - 41 U/L   ALT 9 0 - 44 U/L    Comment: Please note change in reference range.   Alkaline Phosphatase 40 38 - 126 U/L   Total Bilirubin 1.0 0.3 - 1.2 mg/dL   GFR calc non Af Amer >60 >60 mL/min   GFR calc Af Amer >60 >60 mL/min    Comment: (NOTE) The eGFR has been calculated using the CKD EPI equation. This calculation has not been validated in all clinical situations. eGFR's persistently <60 mL/min signify possible Chronic Kidney Disease.    Anion gap 7 5 - 15    Comment: Performed at [facility], [place] [address] [place], [place] [address]    Blood Alcohol level:  No results found for: [facility]  Metabolic Disorder Labs:  Lab Results  Component Value Date   HGBA1C 5.5 12/01/2017   MPG 111.15 12/01/2017   No results found for: PROLACTIN Lab Results  Component Value Date   CHOL 151 12/01/2017    TRIG 23 12/01/2017   HDL 72 12/01/2017   CHOLHDL 2.1 12/01/2017   VLDL 5 12/01/2017   LDLCALC 74 12/01/2017    Current Medications: Current Facility-Administered Medications  Medication Dose Route Frequency Provider Last Rate Last Dose  . acetaminophen (TYLENOL) tablet 650 mg  650 mg Oral Q6H PRN Mordecai Maes, NP      . alum & mag hydroxide-simeth (MAALOX/MYLANTA) 200-200-20 MG/5ML suspension 30 mL  30 mL Oral Q4H PRN Mordecai Maes, NP      . citalopram (CELEXA) tablet 20 mg  20 mg Oral Daily Rankin, Shuvon B, NP  20 mg at 12/01/17 0825  . mirtazapine (REMERON) tablet 7.5 mg  7.5 mg Oral QHS PRN Rankin, Shuvon B, NP      . propranolol (INDERAL) tablet 10 mg  10 mg Oral BID Rankin, Shuvon B, NP   10 mg at 12/01/17 0824  . traZODone (DESYREL) tablet 100 mg  100 mg Oral QHS PRN Rankin, Shuvon B, NP       PTA Medications: Medications Prior to Admission  Medication Sig Dispense Refill Last Dose  . citalopram (CELEXA) 10 MG tablet Take 1 tablet (10 mg total) by mouth daily. (Patient taking differently: Take 20 mg by mouth daily. ) 30 tablet 0   . mirtazapine (REMERON) 7.5 MG tablet Take 7.5 mg by mouth at bedtime as needed for depression or sleep.  1   . propranolol (INDERAL) 10 MG tablet Take 10 mg by mouth 2 (two) times daily.  1   . traZODone (DESYREL) 50 MG tablet Take 1 tablet (50 mg total) by mouth at bedtime as needed for sleep. (Patient taking differently: Take 100 mg by mouth at bedtime as needed for sleep. ) 30 tablet 0     Musculoskeletal: Strength & Muscle Tone: within normal limits Gait & Station: normal Patient leans: N/A  Psychiatric Specialty Exam: Physical Exam  Review of Systems  Constitutional: Positive for weight loss.  HENT: Negative.   Eyes: Negative.   Respiratory: Negative.   Cardiovascular: Negative.   Gastrointestinal: Positive for nausea. Negative for diarrhea and vomiting.  Genitourinary: Negative.   Musculoskeletal: Negative.   Skin:  Negative.   Neurological: Positive for headaches. Negative for seizures.  Endo/Heme/Allergies: Negative.   Psychiatric/Behavioral: Positive for depression and suicidal ideas.  All other systems reviewed and are negative.   Blood pressure 121/83, pulse 98, temperature 98.8 F (37.1 C), temperature source Oral, resp. rate 12, height _0  (1.676 m), weight 54.4 kg (120 lb), last menstrual period 04/22/2017, SpO2 100 %, unknown if currently breastfeeding.Body mass index is 19.37 kg/m.  General Appearance: Fairly Groomed  Eye Contact:  Good  Speech:  Normal Rate  Volume:  Normal  Mood:  Depressed  Affect:  constricted , but smiles briefly at times   Thought Process:  Linear and Descriptions of Associations: Intact  Orientation:  Full (Time, Place, and [place])  Thought Content:  no hallucinations, no delusions, not internally preoccupied   Suicidal Thoughts:  No- denies any current suicidal or self injurious ideations, contracts for safety on unit   Homicidal Thoughts:  reports " I would like to hurt my boyfriend ", but denies any violent or homicidal plans or intentions towards BF at this time  Memory:  recent and remote grossly intact   Judgement:  Fair  Insight:  Fair  Psychomotor Activity:  Decreased  Concentration:  Concentration: Good and Attention Span: Good  Recall:  Good  Fund of Knowledge:  Good  Language:  Good  Akathisia:  Negative  Handed:  Right  AIMS (if indicated):     Assets:  Desire for Improvement Resilience  ADL's:  Intact  Cognition:  WNL  Sleep:  Number of Hours: 6.5    Treatment Plan Summary: Daily contact with patient to assess and evaluate symptoms and progress in treatment, Medication management, Plan inpatient treatment  and medications as below  Observation Level/Precautions:  15 minute checks  Laboratory:  as needed   Psychotherapy:  [place], group therapy   Medications:  Celexa 20 mgrs QDAY, Remeron 7.5 mgrs QHS , Inderal 10 mgr BID  Consultations:  as needed    Discharge Concerns: -    Estimated LOS: 4 days   Other:     Physician Treatment Plan for Primary Diagnosis: MDD, no psychotic features  Long Term Goal(s): Improvement in symptoms so as ready for discharge  Short Term Goals: Ability to identify changes in lifestyle to reduce recurrence of condition will improve and Ability to maintain clinical measurements within normal limits will improve  Physician Treatment Plan for Secondary Diagnosis: Active Problems:   MDD (major depressive disorder)  Long Term Goal(s): Improvement in symptoms so as ready for discharge  Short Term Goals: Ability to identify changes in lifestyle to reduce recurrence of condition will improve and Ability to identify and develop effective coping behaviors will improve  I certify that inpatient services furnished can reasonably be expected to improve the patient's condition.    Jenne Campus, MD 7/12/20192:06 PM

## 2017-12-01 NOTE — Progress Notes (Addendum)
Nutrition Education Note  Pt attended group focusing on general, healthful nutrition education.  RD emphasized the importance of eating regular meals and snacks throughout the day. Consuming sugar-free beverages and incorporating fruits and vegetables into diet when possible. Provided examples of healthy snacks. Patient encouraged to leave group with a goal to improve nutrition/healthy eating.   Diet Order:  Diet Order           Diet regular Room service appropriate? Yes; Fluid consistency: Thin  Diet effective now         Pt is also offered choice of unit snacks mid-morning and mid-afternoon.  Pt is eating as desired.   If additional nutrition issues arise, please consult RD.    Rito Lecomte, MS, RD, LDN, CNSC Inpatient Clinical Dietitian Pager # [phone] After hours/weekend pager # [phone]    

## 2017-12-01 NOTE — Progress Notes (Signed)
D: Patient complains of poor sleep; fair appetite; low energy level and poor concentration.  She rates her depression, hopelessness and anxiety as a 6.  Her goal is to "learn how to let go of the negative thoughts I feel in my life."  Patient states she has been here before for treatment.  Her affect is flat; mood is sad and depressed.  She is observed interacting with her peers.  She denies any thoughts of self harm.    A: Continue to monitor medication management and MD orders.  Safety checks completed every 15 minutes per protocol.  Offer support and encouragement as needed.  R: Patient is receptive to staff; her behavior is appropriate.

## 2017-12-01 NOTE — Progress Notes (Signed)
MHT/Secetrary took call from patient's ex-boyfriend's mother.  Mother stated that she was afraid the patient would be discharged and hurt her son.  Caller said that the patient has been homicidal toward her son and "I fear for his life."  Informed MD of information.

## 2017-12-01 NOTE — Progress Notes (Signed)
The patient shared with the group that she had a "sleepy day" which he attributed to her medication. Her goal for tomorrow is to work on her discharge plans. She would like to be discharged by Sunday since that is her next birthday.

## 2017-12-01 NOTE — [facility] Counselor (Signed)
Adult Comprehensive Assessment  Patient ID: Anne Baxter, female   DOB: Oct 23, 1990, 27 y.o.   MRN: [id number]  Information Source: Information source: Patient  Current Stressors:  Patient states their primary concerns and needs for treatment are:: To be better able to handle crisis situations. Patient states their goals for this hospitilization and ongoing recovery are:: From Treatment team this AM: "to get better." Employment / Job issues: Pt lost her job 2 weeks ago due to attendance issues. Family Relationships: Pt continues to have issues with her children's father, who recently has entered into a new relationship. Financial / Lack of resources (include bankruptcy): Financial stress due to lost job. Housing / Lack of housing: Pt has had to move several times recently due to financial issues.  Living/Environment/Situation:  Living Arrangements: Other relatives Living conditions (as described by patient or guardian): temporary Who else lives in the home?: Pt's cousin and her 2 kids, cousins's father and sister, pt and pt's 2 children. What is atmosphere in current home: Temporary    Family History:  Marital status: Single Long term relationship, how long?: Pt has on again/off again relationship with her children's father. What types of issues is patient dealing with in the relationship?:  Are you sexually active?: Yes What is your sexual orientation?: heterosexual Has your sexual activity been affected by drugs, alcohol, medication, or emotional stress?: no Does patient have children?: Yes How many children?: 2 How is patient's relationship with their children?: daughter,5, son,1  Childhood History:  By whom was/is the patient raised?: Grandparents Additional childhood history information: lived with grandparents because her mother was young.  Mother gave birth to pt at age 73.  Positive childhood. Description of patient's relationship with caregiver when they were a child:  mom: good, dad: no contact Patient's description of current relationship with people who raised him/her: mom: died 2008-10-06: medical issues, dad: no contact How were you disciplined when you got in trouble as a child/adolescent?: appropriate, pt did not get in much trouble Does patient have siblings?: Yes Number of Siblings: 1 Description of patient's current relationship with siblings: younger sister, "great" relationship Did patient suffer any verbal/emotional/physical/sexual abuse as a child?: No Did patient suffer from severe childhood neglect?: No Has patient ever been sexually abused/assaulted/raped as an adolescent or adult?: No Was the patient ever a victim of a crime or a disaster?: No Witnessed domestic violence?: No Has patient been effected by domestic violence as an adult?: No  Education:  Highest grade of school patient has completed: HS diploma, some college Currently a Consulting civil engineer?: No Learning disability?: No  Employment/Work Situation:   Employment situation: Unemployed Where is patient currently employed?:  How long has patient been employed?:  Patient's job has been impacted by current illness:  Describe how patient's job has been impacted: a little.  Has missed work due to mental health problems What is the longest time patient has a held a job?: almost 3 years Where was the patient employed at that time?: [employer] Has patient ever been in the [employer]?: No Are There Guns or Other Weapons in Your Home?: No guns reported.  Financial Resources:   Financial resources: No current income, private insurance  Does patient have a Lawyer or guardian?: No  Alcohol/Substance Abuse:   What has been your use of drugs/alcohol within the last 12 months?: alcohol: pt denies, drugs: pt smoked marijuana once several weeks ago but is not using regularly. If attempted suicide, did drugs/alcohol play a role in  this?: No Alcohol/Substance Abuse Treatment Hx:  Denies past history Has alcohol/substance abuse ever caused legal problems?: No  Social Support System:   Patient's Community Support System: Good Describe Community Support System: family, friends Type of faith/religion: none How does patient's faith help to cope with current illness?: na  Leisure/Recreation:   Leisure and Hobbies: time with family, children, beach, travel  Strengths/Needs:   What is the patient's perception of their strengths?: Pt unable to identify strengths.  IN talking with CSW, she said she has made some good decisions recently (but not all recent decisions) Patient states they can use these personal strengths during their treatment to contribute to their recovery: Pt is thinking about the future, job hunting, and willing to continue treatment.   Patient states these barriers may affect/interfere with their treatment: None Patient states these barriers may affect their return to the community: None Other important information patient would like considered in planning for their treatment: None  Discharge Plan:   Currently receiving community mental health services: Yes (From Whom)([facility] Akintayo, [facility]) Patient states concerns and preferences for aftercare planning are: Pt can no longer go to [facility] since losing her job.  Wants to continue with Dr A and see therapist there as well.  Pt willing to consider PHP/IOP. Pt is continuing her insurance post employment. Patient states they will know when they are safe and ready for discharge when: I actually have a good discharge plan. Does patient have access to transportation?: Yes Does patient have financial barriers related to discharge medications?: No Will patient be returning to same living situation after discharge?: Yes  Summary/Recommendations:   Summary and Recommendations (to be completed by the evaluator): Pt is 27 year old [place]. Pt is diagnosed with major depressive disorder and was  admitted due to increased depression and suicidal ideations.  Pt reports multiple stressors including recent job loss, financial issues, and conflict with her childrehn's father.  Recommendations for pt include crisis stabilization, therapeutic milieu, attend and participate in groups, medication management, and development of comprehensive mental wellness plan.  Lorri FrederickWierda, Shenita Trego Jon. 12/01/2017

## 2017-12-01 NOTE — [facility] Group Notes (Signed)
[facility] LCSW Group Therapy Note  Date/Time: 12/01/17, 1315  Type of Therapy/Topic:  Group Therapy:  Feelings about Diagnosis  Participation Level:  Minimal   Mood: pleasant   Description of Group:    This group will allow patients to explore their thoughts and feelings about diagnoses they have received. Patients will be guided to explore their level of understanding and acceptance of these diagnoses. Facilitator will encourage patients to process their thoughts and feelings about the reactions of others to their diagnosis, and will guide patients in identifying ways to discuss their diagnosis with significant others in their lives. This group will be process-oriented, with patients participating in exploration of their own experiences as well as giving and receiving support and challenge from other group members.   Therapeutic Goals: 1. Patient will demonstrate understanding of diagnosis as evidence by identifying two or more symptoms of the disorder:  2. Patient will be able to express two feelings regarding the diagnosis 3. Patient will demonstrate ability to communicate their needs through discussion and/or role plays  Summary of Patient Progress:Pt made no contributions to the group discussion but did respond to CSW questions near the end.  Pt was in back and CSW unable to see how attentive she was overall.        Therapeutic Modalities:   Cognitive Behavioral Therapy Brief Therapy Feelings Identification   Daleen SquibbGreg Emerson Barretto, LCSW

## 2017-12-01 NOTE — Progress Notes (Signed)
Recreation Therapy Notes  Date: 7.12.19 Time: 0930 Location: [address] Dayroom  Group Topic: Stress Management  Goal Area(s) Addresses:  Patient will verbalize importance of using healthy stress management.  Patient will identify positive emotions associated with healthy stress management.   Intervention: Stress Management  Activity :  Meditation.  LRT introduced the stress management technique of meditation.  LRT played Baxter meditation on being resilient.  Patients were to listen to the meditation and follow along as meditation played.  Education:  Stress Management, Discharge Planning.   Education Outcome: Acknowledges edcuation/In group clarification offered/Needs additional education  Clinical Observations/Feedback: Pt did not attend group.    Anne Baxter, LRT/CTRS         Anne Baxter 12/01/2017 11:39 AM 

## 2017-12-02 MED ORDER — CITALOPRAM HYDROBROMIDE 20 MG PO TABS
20.0000 mg | ORAL_TABLET | Freq: Every day | ORAL | Status: DC
  Administered 2017-12-03 – 2017-12-04 (×2): 20 mg via ORAL
  Filled 2017-12-02 (×4): qty 1

## 2017-12-02 NOTE — Progress Notes (Signed)
Writer spoke with patient 1:1 and she reports feeling sleepy all day today and feel that it may be d/t her medications. Writer encouraged her to speak with her doctor on tomorrow. She had been observed up in the dayroom briefly tonight watching tv. She was in formed of her medication available and she requested medication to aid with sleep. Support given and safety maintained on unit with 15 min checks.

## 2017-12-02 NOTE — [facility] Counselor (Signed)
During SPE, friend reported concern that she keeps going back to her child's father and can't detach from him and it ends up being a war. Friend reports patient stated she wants her ex to feel her pain. Friend had questions about how to help her because it is draining and we keep helping but she continues to do the same things. Friend shared her "ex reports she is faking her mental health struggles" and is constantly trying to get intimate except for when she has her "explosions". CSW encouraged friend to hold patient accountable, to make sure she is attending therapy and taking her medications. CSW explained often people start to feel better and then stop their treatment plans and end up hospitalized. CSW explained as far as the ex, goes giving her the bad news about his new girlfriend and that the girlfriend is keeping her child is best given while she is in the hospital because she is in a safe environment, whereas finding out after discharge could be a safety concern for patient and others, even the friend for lying about it. CSW thanked friend for sharing and encouraged her to practice some self-care but also provided education about how important supports are for people with mental health diagnosis to be successful.   Shellia CleverlyStephanie N Sidney Kann, [place]  12/02/2017 3:29 PM

## 2017-12-02 NOTE — [facility] Suicide Risk Assessment (Signed)
[facility] INPATIENT:  Family/Significant Other Suicide Prevention Education  Suicide Prevention Education:  Education Completed; Luellen Puckerssata Parks, friend, [phone],  (name of family member/significant other) has been identified by the patient as the family member/significant other with whom the patient will be residing, and identified as the Stach(s) who will aid the patient in the event of a mental health crisis (suicidal ideations/suicide attempt).  With written consent from the patient, the family member/significant other has been provided the following suicide prevention education, prior to the and/or following the discharge of the patient.  The suicide prevention education provided includes the following:  Suicide risk factors  Suicide prevention and interventions  National Suicide Hotline telephone number  [facility] assessment telephone number  [facility] Emergency Assistance 911  [facility] and/or [facility]idential [facility] Crisis [facility] telephone number  Request made of family/significant other to:  Remove weapons (e.g., guns, rifles, knives), all items previously/currently identified as safety concern.    Remove drugs/medications (over-the-counter, prescriptions, illicit drugs), all items previously/currently identified as a safety concern.  The family member/significant other verbalizes understanding of the suicide prevention education information provided.  The family member/significant other agrees to remove the items of safety concern listed above. Friend reports that this is the first time she has been through this and she doesn't have a lot of knowledge on suicide prevention. Friend reports her ex is a trigger and is playing with her emotions. Friend stated she does not have access to guns or weapons.   Shellia CleverlyStephanie N Hagar Sadiq 12/02/2017, 3:20 PM

## 2017-12-02 NOTE — Progress Notes (Signed)
Writer spoke with patient 1:1 and she had been hopeful to discharge before her birthday but reported that she was okay witih having to stay. She has been up in the dayroom interacting with peers. Safety maintained on unit with 15 min checks.

## 2017-12-02 NOTE — [facility] Group Notes (Signed)
LCSW Group Therapy Note  12/02/2017    1:15-2:15pm  Type of Therapy and Topic:  Group Therapy: Anger and Coping Skills  Participation Level:  None   Description of Group:   In this group, patients learned how to recognize the physical, cognitive, emotional, and behavioral responses they have to anger-provoking situations.  They identified how they usually or often react when angered, and learned how healthy and unhealthy coping skills work initially, but the unhealthy ones stop working.   They analyzed how their frequently-chosen coping skill is possibly beneficial and how it is possibly unhelpful.  The group discussed a variety of healthier coping skills that could help in resolving the actual issues, as well as how to go about planning for the the possibility of future similar situations.  Therapeutic Goals: 1. Patients will identify one thing that makes them angry and how they feel emotionally and physically, what their thoughts are or tend to be in those situations, and what healthy or unhealthy coping mechanism they typically use 2. Patients will identify how their coping technique works for them, as well as how it works against them. 3. Patients will explore possible new behaviors to use in future anger situations. 4. Patients will learn that anger itself is normal and cannot be eliminated, and that healthier coping skills can assist with resolving conflict rather than worsening situations.  Summary of Patient Progress:  The patient entered the room in the last 10 minutes of group, listened but did not participate.  Therapeutic Modalities:   Cognitive Behavioral Therapy Motivation Interviewing  Lynnell ChadMareida J Grossman-Orr  .

## 2017-12-02 NOTE — Progress Notes (Signed)
The patient shared in group that she has been in bed all day except for dinner. Her goal for tomorrow is to work on getting discharged.

## 2017-12-02 NOTE — Progress Notes (Signed)
D: Patient has complained of drowsiness today.  She has minimal interaction with her peers.  Patient has not filled out a self inventory today and was asked to.  She denies any suicidal ideation today.  She is complaint with her medications.    A: Continue to monitor medication management and MD orders.  Safety checks completed every 15 minutes per protocol.  Offer support and encouragement as needed.  R: Patient remains withdrawn and isolative.

## 2017-12-02 NOTE — Progress Notes (Signed)
[facility] MD Progress Note  12/02/2017 12:34 PM Anne Baxter  MRN:  [id number] Subjective: Reports she is feeling better, less depressed, describes some medication related sedation, currently denies suicidal ideations. Objective : I have reviewed the chart notes and have met with patient. 27 year old female who presented to the hospital voluntarily due to worsening depression, anxiety, suicidal thoughts, which she attributed to significant degree to relationship stressor with her children's father. Reports partially improved mood, continues to present depressed, with a constricted affect and has tended to remain isolated, in her room.  Tends to ruminate about her relationship stressors.  Denies suicidal ideations. Thus far tolerating medications well, but reports feeling vaguely sedated.  Currently alert and attentive . No disruptive or agitated behaviors on unit.    Principal Problem: depression Diagnosis:   Patient Active Problem List   Diagnosis Date Noted  . MDD (major depressive disorder) [F32.9] 11/30/2017  . Severe recurrent major depression without psychotic features ([place]) [F33.2] 08/07/2017  . Pregnant [Z34.90] 08/07/2017  . Depression [F32.9] 09/20/2016  . History of migraine headaches [Z86.69] 03/16/2016   Total Time spent with patient: 15 minutes  Past Psychiatric History:   Past Medical History:  Past Medical History:  Diagnosis Date  . Anemia 2010   FE  . Depression   . Headache   . Infection 02/2017   CHLAMYDIA  . Pregnant   . Syncope   . Wrist fracture 2009 X2    Past Surgical History:  Procedure Laterality Date  . NO PAST SURGERIES     Family History:  Family History  Problem Relation Age of Onset  . Kidney disease Mother        FAILURE  . Heart disease Mother        CHF  . Early death Mother 11  . Hypertension Mother   . Thrombophlebitis Mother   . Hyperlipidemia Maternal Grandmother   . Hypertension Maternal Grandmother    Family Psychiatric   History:  Social History:  Social History   Substance and Sexual Activity  Alcohol Use No  . Alcohol/week: 0.0 oz   Comment: RARELY     Social History   Substance and Sexual Activity  Drug Use No    Social History   Socioeconomic History  . Marital status: Single    Spouse name: Not on file  . Number of children: 1  . Years of education: 15  . Highest education level: Not on file  Occupational History  . Occupation: CUSTOMER SERVICE    Employer: [employer]  . Financial resource strain: Not on file  . Food insecurity:    Worry: Not on file    Inability: Not on file  . Transportation needs:    Medical: Not on file    Non-medical: Not on file  Tobacco Use  . Smoking status: Never Smoker  . Smokeless tobacco: Never Used  Substance and Sexual Activity  . Alcohol use: No    Alcohol/week: 0.0 oz    Comment: RARELY  . Drug use: No  . Sexual activity: Yes    Partners: Male    Birth control/protection: None  Lifestyle  . Physical activity:    Days per week: Not on file    Minutes per session: Not on file  . Stress: Not on file  Relationships  . Social connections:    Talks on phone: Not on file    Gets together: Not on file    Attends religious service: Not on file  Active member of club or organization: Not on file    Attends meetings of clubs or organizations: Not on file    Relationship status: Not on file  Other Topics Concern  . Not on file  Social History Narrative   Lives at home with boyfriend and daughter.   Right-handed.   No caffeine use.   Additional Social History:    Pain Medications: Please see MAR Prescriptions: Please see MAR Over the Counter: Please see MAR History of alcohol / drug use?: No history of alcohol / drug abuse Longest period of sobriety (when/how long): N/A  Sleep: Good  Appetite:  Fair  Current Medications: Current Facility-Administered Medications  Medication Dose Route Frequency Provider Last Rate Last  Dose  . acetaminophen (TYLENOL) tablet 650 mg  650 mg Oral Q6H PRN Mordecai Maes, NP      . alum & mag hydroxide-simeth (MAALOX/MYLANTA) 200-200-20 MG/5ML suspension 30 mL  30 mL Oral Q4H PRN Mordecai Maes, NP      . citalopram (CELEXA) tablet 20 mg  20 mg Oral Daily Rankin, Shuvon B, NP   20 mg at 12/02/17 0813  . mirtazapine (REMERON) tablet 7.5 mg  7.5 mg Oral QHS PRN Rankin, Shuvon B, NP   7.5 mg at 12/01/17 2159  . propranolol (INDERAL) tablet 10 mg  10 mg Oral BID Rankin, Shuvon B, NP   10 mg at 12/02/17 9476    Lab Results:  Results for orders placed or performed during the hospital encounter of 11/30/17 (from the past 48 hour(s))  Urinalysis, Routine w reflex microscopic     Status: Abnormal   Collection Time: 11/30/17  1:53 PM  Result Value Ref Range   Color, Urine COLORLESS (A) YELLOW   APPearance CLEAR CLEAR   Specific Gravity, Urine 1.003 (L) 1.005 - 1.030   pH 6.0 5.0 - 8.0   Glucose, UA NEGATIVE NEGATIVE mg/dL   Hgb urine dipstick NEGATIVE NEGATIVE   Bilirubin Urine NEGATIVE NEGATIVE   Ketones, ur NEGATIVE NEGATIVE mg/dL   Protein, ur NEGATIVE NEGATIVE mg/dL   Nitrite NEGATIVE NEGATIVE   Leukocytes, UA NEGATIVE NEGATIVE    Comment: Performed at [facility], [place] [address], [place], [place] [address]  Pregnancy, urine     Status: None   Collection Time: 11/30/17  1:53 PM  Result Value Ref Range   Preg Test, Ur NEGATIVE NEGATIVE    Comment:        THE SENSITIVITY OF THIS METHODOLOGY IS >20 mIU/mL. Performed at [facility], [place] [address] [place], [place] [address]   Urine rapid drug screen (hosp performed)     Status: Abnormal   Collection Time: 11/30/17  2:15 PM  Result Value Ref Range   Opiates NONE DETECTED NONE DETECTED   Cocaine NONE DETECTED NONE DETECTED   Benzodiazepines NONE DETECTED NONE DETECTED   Amphetamines NONE DETECTED NONE DETECTED   Tetrahydrocannabinol POSITIVE (A) NONE DETECTED   Barbiturates (A)  NONE DETECTED    Result not available. Reagent lot number recalled by manufacturer.    Comment: Performed at [facility], [place] [address] [place], [place] [address]  TSH     Status: None   Collection Time: 12/01/17  6:28 AM  Result Value Ref Range   TSH 0.959 0.350 - 4.500 uIU/mL    Comment: Performed by a 3rd Generation assay with a functional sensitivity of <=0.01 uIU/mL. Performed at [facility], [place] [address], [place], [place] [address]  Hemoglobin A1c     Status: None   Collection Time: 12/01/17  6:28 AM  Result Value Ref Range   Hgb A1c MFr Bld 5.5 4.8 - 5.6 %    Comment: (NOTE) Pre diabetes:          5.7%-6.4% Diabetes:              >6.4% Glycemic control for   <7.0% adults with diabetes    Mean Plasma Glucose 111.15 mg/dL    Comment: Performed at [place] [address] [place], [place] [address]  Lipid panel     Status: None   Collection Time: 12/01/17  6:28 AM  Result Value Ref Range   Cholesterol 151 0 - 200 mg/dL   Triglycerides 23 <150 mg/dL   HDL 72 >40 mg/dL   Total CHOL/HDL Ratio 2.1 RATIO   VLDL 5 0 - 40 mg/dL   LDL Cholesterol 74 0 - 99 mg/dL    Comment:        Total Cholesterol/HDL:CHD Risk Coronary Heart Disease Risk Table                     Men   Women  1/2 Average Risk   3.4   3.3  Average Risk       5.0   4.4  2 X Average Risk   9.6   7.1  3 X Average Risk  23.4   11.0        Use the calculated Patient Ratio above and the CHD Risk Table to determine the patient's CHD Risk.        ATP III CLASSIFICATION (LDL):  <100     mg/dL   Optimal  100-129  mg/dL   Near or Above                    Optimal  130-159  mg/dL   Borderline  160-189  mg/dL   High  >190     mg/dL   Very High Performed at [facility] [address], [place], [place] [address]   CBC with Differential/Platelet     Status: Abnormal   Collection Time: 12/01/17  6:28 AM  Result Value Ref Range   WBC 4.5 4.0  - 10.5 K/uL   RBC 4.32 3.87 - 5.11 MIL/uL   Hemoglobin 10.5 (L) 12.0 - 15.0 g/dL   HCT 32.3 (L) 36.0 - 46.0 %   MCV 74.8 (L) 78.0 - 100.0 fL   MCH 24.3 (L) 26.0 - 34.0 pg   MCHC 32.5 30.0 - 36.0 g/dL   RDW 14.5 11.5 - 15.5 %   Platelets 338 150 - 400 K/uL   Neutrophils Relative % 47 %   Neutro Abs 2.1 1.7 - 7.7 K/uL   Lymphocytes Relative 37 %   Lymphs Abs 1.7 0.7 - 4.0 K/uL   Monocytes Relative 14 %   Monocytes Absolute 0.6 0.1 - 1.0 K/uL   Eosinophils Relative 1 %   Eosinophils Absolute 0.1 0.0 - 0.7 K/uL   Basophils Relative 1 %   Basophils Absolute 0.0 0.0 - 0.1 K/uL   RBC Morphology TARGET CELLS     Comment: Performed at [facility], [place] [address], [place], [place] [address]  Comprehensive metabolic panel     Status: Abnormal   Collection Time: 12/01/17  6:28 AM  Result Value Ref Range   Sodium 137 135 - 145 mmol/L   Potassium 4.1 3.5 -  5.1 mmol/L   Chloride 104 98 - 111 mmol/L    Comment: Please note change in reference range.   CO2 26 22 - 32 mmol/L   Glucose, Bld 89 70 - 99 mg/dL    Comment: Please note change in reference range.   BUN 11 6 - 20 mg/dL    Comment: Please note change in reference range.   Creatinine, Ser 0.66 0.44 - 1.00 mg/dL   Calcium 9.1 8.9 - 10.3 mg/dL   Total Protein 6.8 6.5 - 8.1 g/dL   Albumin 3.9 3.5 - 5.0 g/dL   AST 14 (L) 15 - 41 U/L   ALT 9 0 - 44 U/L    Comment: Please note change in reference range.   Alkaline Phosphatase 40 38 - 126 U/L   Total Bilirubin 1.0 0.3 - 1.2 mg/dL   GFR calc non Af Amer >60 >60 mL/min   GFR calc Af Amer >60 >60 mL/min    Comment: (NOTE) The eGFR has been calculated using the CKD EPI equation. This calculation has not been validated in all clinical situations. eGFR's persistently <60 mL/min signify possible Chronic Kidney Disease.    Anion gap 7 5 - 15    Comment: Performed at [facility], [place] [address] [place], [place] [address]    Blood Alcohol level:  No  results found for: [facility]  Metabolic Disorder Labs: Lab Results  Component Value Date   HGBA1C 5.5 12/01/2017   MPG 111.15 12/01/2017   No results found for: PROLACTIN Lab Results  Component Value Date   CHOL 151 12/01/2017   TRIG 23 12/01/2017   HDL 72 12/01/2017   CHOLHDL 2.1 12/01/2017   VLDL 5 12/01/2017   LDLCALC 74 12/01/2017    Physical Findings: AIMS:  , ,  ,  ,    CIWA:    COWS:     Musculoskeletal: Strength & Muscle Tone: within normal limits Gait & Station: normal Patient leans: N/A  Psychiatric Specialty Exam: Physical Exam  ROS no headache, no chest pain, no shortness of breath, no vomiting  Blood pressure 108/73, pulse 84, temperature 98.8 F (37.1 C), temperature source Oral, resp. rate 16, height _0  (1.676 m), weight 54.4 kg (120 lb), last menstrual period 04/22/2017, SpO2 100 %, unknown if currently breastfeeding.Body mass index is 19.37 kg/m.  General Appearance: Well-groomed  Eye Contact:  Good  Speech:  Normal Rate  Volume:  Decreased  Mood:  Reports partial improvement, continues to present depressed   Affect:  Constricted, improves partially during session, smiles briefly at times  Thought Process:  Linear and Descriptions of Associations: Intact  Orientation:  Full (Time, Place, and [place])  Thought Content:  Denies hallucinations, no delusions, remains ruminative about stressors  Suicidal Thoughts:  No-at this time denies suicidal ideations, denies self-injurious ideations, denies homicidal or violent ideations , contracts for safety on unit  Homicidal Thoughts:  No  Memory:  Recent and remote grossly intact  Judgement:  Fair  Insight:  Fair  Psychomotor Activity:  Decreased  Concentration:  Concentration: Good and Attention Span: Good  Recall:  Good  Fund of Knowledge:  Good  Language:  Good  Akathisia:  Negative  Handed:  Right  AIMS (if indicated):     Assets:  Communication Skills Desire for Improvement Resilience  ADL's:  Intact   Cognition:  WNL  Sleep:  Number of Hours: 6.5   Assessment -27 year old female, history of depression, presented due to worsening depression and thoughts  of suicide by crashing her car.  Reports significant psychosocial stressors, particularly long-term relationship stressors with her boyfriend/father of her children.  Today describes partial improvement but continues to present depressed and with a constricted affect.  Not suicidal.  Reports mild sedation related to medications, no other side effects at this time.  Treatment Plan Summary: Daily contact with patient to assess and evaluate symptoms and progress in treatment, Medication management, Plan inpatient treatmetn and medications as below Encourage ongoing group and milieu participation to work on coping skills and symptom reduction. Treatment team working on disposition planning Change Celexa to 20 mgrs QHS dosing for depression ( night time dosing to address sedation) D/C Remeron Continue Inderal 10 mgrs BID for anxiety Treatment Team working on disposition planning options Jenne Campus, MD 12/02/2017, 12:34 PM

## 2017-12-03 NOTE — Progress Notes (Signed)
[facility] MD Progress Note  12/03/2017 4:41 PM Anne Baxter  MRN:  [id number] Subjective: Patient reports feeling "a lot better".  States she is feeling more optimistic, at this time plans to go live with a friend following discharge, hopeful to get a job soon, and states that she is less concerned and less focused on her relationship issues and plans to focus more on herself in her own improvement.  Denies suicidal ideations. Denies medication side effects Objective : I have reviewed the chart notes and have met with patient. 27 year old female who presented to the hospital voluntarily due to worsening depression, anxiety, suicidal thoughts, which she attributed to significant degree to relationship stressor with her children's father. At this time patient is presenting with improving mood, improving range of affect, minimizing current neurovegetative symptoms, denying suicidal ideations, presenting future oriented. Behavior on unit in good control, visible in dayroom, pleasant on approach. Denies medication side effects.  Yesterday had presented basically drowsy/sedated, this is improved today and at the present time is fully alert and attentive. Of note, it is patient's 27th birthday today, states that this milestone is helping her feel more positive with a feeling that she will have a "better year"   Principal Problem: depression Diagnosis:   Patient Active Problem List   Diagnosis Date Noted  . MDD (major depressive disorder) [F32.9] 11/30/2017  . Severe recurrent major depression without psychotic features ([place]) [F33.2] 08/07/2017  . Pregnant [Z34.90] 08/07/2017  . Depression [F32.9] 09/20/2016  . History of migraine headaches [Z86.69] 03/16/2016   Total Time spent with patient: 15 minutes  Past Psychiatric History:   Past Medical History:  Past Medical History:  Diagnosis Date  . Anemia 2010   FE  . Depression   . Headache   . Infection 02/2017   CHLAMYDIA  . Pregnant   .  Syncope   . Wrist fracture 2009 X2    Past Surgical History:  Procedure Laterality Date  . NO PAST SURGERIES     Family History:  Family History  Problem Relation Age of Onset  . Kidney disease Mother        FAILURE  . Heart disease Mother        CHF  . Early death Mother 22  . Hypertension Mother   . Thrombophlebitis Mother   . Hyperlipidemia Maternal Grandmother   . Hypertension Maternal Grandmother    Family Psychiatric  History:  Social History:  Social History   Substance and Sexual Activity  Alcohol Use No  . Alcohol/week: 0.0 oz   Comment: RARELY     Social History   Substance and Sexual Activity  Drug Use No    Social History   Socioeconomic History  . Marital status: Single    Spouse name: Not on file  . Number of children: 1  . Years of education: 26  . Highest education level: Not on file  Occupational History  . Occupation: CUSTOMER SERVICE    Employer: [employer]  . Financial resource strain: Not on file  . Food insecurity:    Worry: Not on file    Inability: Not on file  . Transportation needs:    Medical: Not on file    Non-medical: Not on file  Tobacco Use  . Smoking status: Never Smoker  . Smokeless tobacco: Never Used  Substance and Sexual Activity  . Alcohol use: No    Alcohol/week: 0.0 oz    Comment: RARELY  . Drug use: No  .  Sexual activity: Yes    Partners: Male    Birth control/protection: None  Lifestyle  . Physical activity:    Days per week: Not on file    Minutes per session: Not on file  . Stress: Not on file  Relationships  . Social connections:    Talks on phone: Not on file    Gets together: Not on file    Attends religious service: Not on file    Active member of club or organization: Not on file    Attends meetings of clubs or organizations: Not on file    Relationship status: Not on file  Other Topics Concern  . Not on file  Social History Narrative   Lives at home with boyfriend and  daughter.   Right-handed.   No caffeine use.   Additional Social History:    Pain Medications: Please see MAR Prescriptions: Please see MAR Over the Counter: Please see MAR History of alcohol / drug use?: No history of alcohol / drug abuse Longest period of sobriety (when/how long): N/A  Sleep: Good  Appetite:  Good  Current Medications: Current Facility-Administered Medications  Medication Dose Route Frequency Provider Last Rate Last Dose  . acetaminophen (TYLENOL) tablet 650 mg  650 mg Oral Q6H PRN Mordecai Maes, NP      . alum & mag hydroxide-simeth (MAALOX/MYLANTA) 200-200-20 MG/5ML suspension 30 mL  30 mL Oral Q4H PRN Mordecai Maes, NP      . citalopram (CELEXA) tablet 20 mg  20 mg Oral QHS Lawton Dollinger A, MD      . propranolol (INDERAL) tablet 10 mg  10 mg Oral BID Rankin, Shuvon B, NP   10 mg at 12/03/17 8828    Lab Results:  No results found for this or any previous visit (from the past 48 hour(s)).  Blood Alcohol level:  No results found for: [facility]  Metabolic Disorder Labs: Lab Results  Component Value Date   HGBA1C 5.5 12/01/2017   MPG 111.15 12/01/2017   No results found for: PROLACTIN Lab Results  Component Value Date   CHOL 151 12/01/2017   TRIG 23 12/01/2017   HDL 72 12/01/2017   CHOLHDL 2.1 12/01/2017   VLDL 5 12/01/2017   LDLCALC 74 12/01/2017    Physical Findings: AIMS:  , ,  ,  ,    CIWA:    COWS:     Musculoskeletal: Strength & Muscle Tone: within normal limits Gait & Station: normal Patient leans: N/A  Psychiatric Specialty Exam: Physical Exam  ROS no headache, no chest pain, no shortness of breath, no vomiting  Blood pressure 110/88, pulse (!) 105, temperature 98.7 F (37.1 C), temperature source Oral, resp. rate 16, height _0  (1.676 m), weight 54.4 kg (120 lb), last menstrual period 04/22/2017, SpO2 100 %, unknown if currently breastfeeding.Body mass index is 19.37 kg/m.  General Appearance: Well-groomed  Eye Contact:   Good  Speech:  Normal Rate  Volume:  Normal  Mood:  Improved mood, currently denies depression  Affect:  Full Range and More reactive  Thought Process:  Linear and Descriptions of Associations: Intact  Orientation:  Full (Time, Place, and [place])  Thought Content:  No hallucinations, no delusions, future oriented.  Not internally preoccupied  Suicidal Thoughts:  No-at this time denies suicidal ideations, denies self-injurious ideations, denies homicidal or violent ideations , contracts for safety on unit  Homicidal Thoughts:  No  Memory:  Recent and remote grossly intact  Judgement:  Other:  Improving  Insight:  Improving  Psychomotor Activity:  Normal  Concentration:  Concentration: Good and Attention Span: Good  Recall:  Good  Fund of Knowledge:  Good  Language:  Good  Akathisia:  Negative  Handed:  Right  AIMS (if indicated):     Assets:  Communication Skills Desire for Improvement Resilience  ADL's:  Intact  Cognition:  WNL  Sleep:  Number of Hours: 6.75   Assessment - patient is presenting with improved mood, fuller range of affect, presenting future oriented and more optimistic.  Denies suicidal ideations .  Tolerating Celexa trial well thus far.  As she improves she is focusing more on discharge planning, states she plans to go live with a friend after discharge.  Treatment Plan Summary: Daily contact with patient to assess and evaluate symptoms and progress in treatment, Medication management, Plan inpatient treatmetn and medications as below  Treatment plan reviewed as below today July 14 Encourage ongoing group and milieu participation to work on Radiographer, therapeutic and symptom reduction. Treatment team working on disposition planning Continue Celexa  20 mgrs QHS dosing for depression ( night time dosing to address sedation) Continue Inderal 10 mgrs BID for anxiety Treatment Team working on disposition planning options Jenne Campus, MD 12/03/2017, 4:41 PM   Patient ID:  Anne Baxter, female   DOB: Dec 21, 1990, 27 y.o.   MRN: [id number]

## 2017-12-03 NOTE — Plan of Care (Signed)
  Problem: Education: Goal: Emotional status will improve Outcome: Progressing Goal: Mental status will improve Outcome: Progressing   Problem: Activity: Goal: Interest or engagement in activities will improve Outcome: Progressing Goal: Sleeping patterns will improve Outcome: Progressing   

## 2017-12-03 NOTE — Progress Notes (Signed)
D: Patient is not as lethargic and drowsy today.  She is tolerating her medications well.  Her affect is brighter; her mood less depressed.  Her goal today is to "go home and celebrate my birthday."  She is sleeping well; appetite is good.  She rates her anxiety and depression a 4; denies any hopelessness.  Patient denies any suicidal thoughts.  A: Continue to monitor medication management and MD orders.  Safety checks completed every 15 minutes per protocol.  Offer support and encouragement as needed.  R: Patient is receptive to staff; her behavior is appropriate.

## 2017-12-03 NOTE — [facility] Group Notes (Signed)
[facility] LCSW Group Therapy Note  12/03/2017  10:00-11:00AM  Type of Therapy and Topic:  Group Therapy:  Being Your Own Support  Participation Level:  Active   Description of Group:  Patients in this group were introduced to the concept that self-support is an essential part of recovery.  A song entitled "My Own Hero" was played and a group discussion ensued in which patients stated they could relate to the song and it inspired them to realize they have be willing to help themselves in order to succeed, because other people cannot achieve sobriety or stability for them.  We discussed adding a variety of healthy supports to address the various needs in their lives.  A song was played called "I Know Where I've Been" toward the end of group and used to conduct an inspirational wrap-up to group of remembering how far they have already come in their journey.  Therapeutic Goals: 1)  demonstrate the importance of being a part of one's own support system 2)  discuss reasons people in one's life may eventually be unable to be continually supportive  3)  identify the patient's current support system and   4)  elicit commitments to add healthy supports and to become more conscious of being self-supportive   Summary of Patient Progress:  The patient expressed that her best friend of 16 years is the [place] she can go to for everything, knows her triggers, and knows how to be confrontational with her about her own actions, but without putting her down and being too hard.  She interacted well and was supportive of others.   Therapeutic Modalities:   Motivational Interviewing Activity  Lynnell ChadMareida J Grossman-Orr

## 2017-12-03 NOTE — Progress Notes (Signed)
Patient has been up and active on the unit, attended group this evening and has voiced no complaints. Writer wished her a happy birthday and she had family to visit tonight. She is hoping to discharge on tomorrow.Patient currently denies having pain, -si/hi/a/v hall. Support and encouragement offered, safety maintained on unit, will continue to monitor.

## 2017-12-04 DIAGNOSIS — G4700 Insomnia, unspecified: Secondary | ICD-10-CM

## 2017-12-04 DIAGNOSIS — R45851 Suicidal ideations: Secondary | ICD-10-CM

## 2017-12-04 DIAGNOSIS — F419 Anxiety disorder, unspecified: Secondary | ICD-10-CM

## 2017-12-04 MED ORDER — TRAZODONE HCL 50 MG PO TABS: 50.0000 mg | ORAL_TABLET | Freq: Every evening | ORAL | 0 refills | Status: DC | PRN

## 2017-12-04 MED ORDER — PROPRANOLOL HCL 10 MG PO TABS: 10.0000 mg | ORAL_TABLET | Freq: Two times a day (BID) | ORAL | 0 refills | Status: DC

## 2017-12-04 MED ORDER — CITALOPRAM HYDROBROMIDE 20 MG PO TABS: 20.0000 mg | ORAL_TABLET | Freq: Every day | ORAL | 0 refills | Status: DC

## 2017-12-04 NOTE — Progress Notes (Signed)
D: Pt was in dayroom upon initial approach.  Pt presents with appropriate affect and mood.  She describes her day as "good" and reports her goal was to "talk to the doctor about staying another day."  Pt met her goal and she reports feeling safe to discharge tomorrow.  Pt denies SI/HI, denies hallucinations, reports L upper tooth pain of 9/10.  Pt has been visible in milieu interacting with peers and staff appropriately.  Pt attended evening group.    A: Introduced self to pt.  Actively listened to pt and offered support and encouragement. Medication administered per order.  PRN medication administered for pain.  Q15 minute safety checks maintained.  R: Pt is safe on the unit.  Pt is compliant with medications.  Pt verbally contracts for safety.  Will continue to monitor and assess.

## 2017-12-04 NOTE — Discharge Summary (Addendum)
Physician Discharge Summary Note  Patient:  Anne Baxter is an 27 y.o., female MRN:  [id number] DOB:  05/09/1991 Patient phone:  [phone] (home)  Patient address:   [address] [place] [address],  Total Time spent with patient: Greater than 30 minutes`  Date of Admission:  11/30/2017  Date of Discharge: 12/04/2017  Reason for Admission: Worsening depression, anxiety particularly over the last two weeks with SI with thoughts of crashing a car.  Principal Problem: MDD (major depressive disorder)  Discharge Diagnoses: Patient Active Problem List   Diagnosis Date Noted  . MDD (major depressive disorder) [F32.9] 11/30/2017    Priority: High  . Severe recurrent major depression without psychotic features (HCC) [F33.2] 08/07/2017  . Pregnant [Z34.90] 08/07/2017  . Depression [F32.9] 09/20/2016  . History of migraine headaches [Z86.69] 03/16/2016   Past Psychiatric History: See H&P  Past Medical History:  Past Medical History:  Diagnosis Date  . Anemia 2010   FE  . Depression   . Headache   . Infection 02/2017   CHLAMYDIA  . Pregnant   . Syncope   . Wrist fracture 2009 X2    Past Surgical History:  Procedure Laterality Date  . NO PAST SURGERIES     Family History:  Family History  Problem Relation Age of Onset  . Kidney disease Mother        FAILURE  . Heart disease Mother        CHF  . Early death Mother 54  . Hypertension Mother   . Thrombophlebitis Mother   . Hyperlipidemia Maternal Grandmother   . Hypertension Maternal Grandmother    Family Psychiatric  History: See H&P  Social History:  Social History   Substance and Sexual Activity  Alcohol Use No  . Alcohol/week: 0.0 oz   Comment: RARELY     Social History   Substance and Sexual Activity  Drug Use No    Social History   Socioeconomic History  . Marital status: Single    Spouse name: Not on file  . Number of children: 1  . Years of education: 67  . Highest  education level: Not on file  Occupational History  . Occupation: CUSTOMER SERVICE    Employer: [employer]  Social Needs  . Financial resource strain: Not on file  . Food insecurity:    Worry: Not on file    Inability: Not on file  . Transportation needs:    Medical: Not on file    Non-medical: Not on file  Tobacco Use  . Smoking status: Never Smoker  . Smokeless tobacco: Never Used  Substance and Sexual Activity  . Alcohol use: No    Alcohol/week: 0.0 oz    Comment: RARELY  . Drug use: No  . Sexual activity: Yes    Partners: Male    Birth control/protection: None  Lifestyle  . Physical activity:    Days per week: Not on file    Minutes per session: Not on file  . Stress: Not on file  Relationships  . Social connections:    Talks on phone: Not on file    Gets together: Not on file    Attends religious service: Not on file    Active member of club or organization: Not on file    Attends meetings of clubs or organizations: Not on file    Relationship status: Not on file  Other Topics Concern  . Not on file  Social History Narrative   Lives  at home with boyfriend and daughter.   Right-handed.   No caffeine use.   Hospital Course: (Per Md's admission notes): 27 year old female. Presented to the hospital voluntarily, reporting worsening depression, anxiety particularly over the last two weeks. She reports she developed suicidal ideations earlier this week, with thoughts of crashing her car into her BF's car. She also reports vague violent but not homicidal ideations towards her BF, states " I would like to hurt him , no kill him", but denies current plan or intention. Identifies several psychosocial stressors- reports recent break up with BF, death of an aunt , recent loss of employment have been major stressors. Endorses neuro-vegetative symptoms .  Anne Baxter was admitted to the hospital for worsening symptoms of depression & crisis management due to suicidal thoughts with plans  to crash her car. She was brought to the hospital for evaluation & treatment.   After her admission assessment, Desarie's presenting symptoms were identified. The medication regimen targeting those symptoms were initiated. She was medicated & discharged on; Citalopram 20 mg for depression & Propranolol 10 mg for anxiety. She presented no other significant pre-existing medical problems that required treatment. He was enrolled & participated in the group counseling sessions being offered & held on this unit. She learned coping skills..  During the course of her hospitalization, Rozetta's improvement was monitored by observation & her daily report of symptom reduction noted. Her emotional and mental status were monitored by daily self-inventory reports completed by her & the clinical staff. She was evaluated daily by the treatment team for mood stability and plans for continued recovery after discharge. She was offered further treatment options upon discharge on an outpatient basis as noted below.   Upon discharge, Anne Baxter was both mentally and medically stable. She denies suicidal/homicidal ideations, auditory/visual/tactile hallucinations, delusional thoughts or paranoia. [facility] in no apparent distress.   Physical Findings: AIMS:  , ,  ,  ,    CIWA:    COWS:     Musculoskeletal: Strength & Muscle Tone: within normal limits Gait & Station: normal Patient leans: N/A  Psychiatric Specialty Exam: See SRA by MD Physical Exam  Vitals reviewed. Constitutional: She appears well-developed.  HENT:  Head: Normocephalic.  Eyes: Pupils are equal, round, and reactive to light.  Neck: Normal range of motion.  Cardiovascular: Normal rate.  Respiratory: Effort normal.  GI: Soft.  Genitourinary:  Genitourinary Comments: Deferred  Musculoskeletal: Normal range of motion.  Neurological: She is alert.  Skin: Skin is warm.  Psychiatric: She has a normal mood and affect. Her behavior is  normal.    Review of Systems  Constitutional: Negative.   HENT: Negative.   Eyes: Negative.   Respiratory: Negative.   Cardiovascular: Negative.   Gastrointestinal: Negative.   Genitourinary: Negative.   Musculoskeletal: Negative.   Skin: Negative.   Neurological: Negative.   Endo/Heme/Allergies: Negative.   Psychiatric/Behavioral: Positive for depression ( Stable) and substance abuse (Hx. THC). Negative for hallucinations, memory loss and suicidal ideas. The patient has insomnia (Stable). The patient is not nervous/anxious.   All other systems reviewed and are negative.   Blood pressure 130/89, pulse (!) 102, temperature 98.7 F (37.1 C), temperature source Oral, resp. rate 16, height 5\' 6"  (1.676 m), weight 54.4 kg (120 lb), last menstrual period 04/22/2017, SpO2 100 %, unknown if currently breastfeeding.Body mass index is 19.37 kg/m.   Has this patient used any form of tobacco in the last 30 days? (Cigarettes, Smokeless Tobacco,  Cigars, and/or Pipes)  No  Blood Alcohol level:  No results found for: [facility]  Metabolic Disorder Labs:  Lab Results  Component Value Date   HGBA1C 5.5 12/01/2017   MPG 111.15 12/01/2017   No results found for: PROLACTIN Lab Results  Component Value Date   CHOL 151 12/01/2017   TRIG 23 12/01/2017   HDL 72 12/01/2017   CHOLHDL 2.1 12/01/2017   VLDL 5 12/01/2017   LDLCALC 74 12/01/2017   See Psychiatric Specialty Exam and Suicide Risk Assessment completed by Attending Physician prior to discharge.  Discharge destination:  Home  Is patient on multiple antipsychotic therapies at discharge:  No   Has Patient had three or more failed trials of antipsychotic monotherapy by history:  No  Recommended Plan for Multiple Antipsychotic Therapies: NA  Allergies as of 12/05/2017   No Known Allergies     Medication List    STOP taking these medications   mirtazapine 7.5 MG tablet Commonly known as:  REMERON   traZODone 50 MG tablet Commonly known  as:  DESYREL     TAKE these medications     Indication  citalopram 20 MG tablet Commonly known as:  CELEXA Take 1 tablet (20 mg total) by mouth at bedtime. For depression What changed:    medication strength  how much to take  when to take this  additional instructions  Indication:  Depression, Generalized Anxiety Disorder   propranolol 10 MG tablet Commonly known as:  INDERAL Take 1 tablet (10 mg total) by mouth 2 (two) times daily. For anxiety What changed:  additional instructions  Indication:  Anxiety      Follow-up Information    [facility] on 12/06/2017.   Why:  Please attend your medication appt with Dr Jannifer Franklin on Wednesday, 12/06/17, at 9:45am.  Please attend your therapy appt with Graylin Shiver on Tuesday, 12/26/17, at 11:00am. Contact information: [address] [place] [address] [phone]          Follow-up recommendations: Activity:  As tolerated Diet: As recommended by your primary care doctor. Keep all scheduled follow-up appointments as recommended.  Comments: Patient is instructed prior to discharge to: Take all medications as prescribed by his/her mental healthcare provider. Report any adverse effects and or reactions from the medicines to his/her outpatient provider promptly. Patient has been instructed & cautioned: To not engage in alcohol and or illegal drug use while on prescription medicines. In the event of worsening symptoms, patient is instructed to call the crisis hotline, 911 and or go to the nearest ED for appropriate evaluation and treatment of symptoms. To follow-up with his/her primary care provider for your other medical issues, concerns and or health care needs.   Signed: Armandina Stammer, NP, PMHNP, FNP-BC 12/05/2017, 9:12 AM   Patient seen, Suicide Assessment Completed.  Disposition Plan Reviewed

## 2017-12-04 NOTE — Progress Notes (Signed)
Recreation Therapy Notes  Date: 7.15.19 Time: 0930 Location: [address] Dayroom  Group Topic: Stress Management  Goal Area(s) Addresses:  Patient will verbalize importance of using healthy stress management.  Patient will identify positive emotions associated with healthy stress management.   Intervention: Stress Management  Activity :  Guided Imagery.  LRT introduced the stress management technique of guided imagery.  LRT read a script that allowed patients to picture their peaceful place.  Patients were to follow along as the script was read to engage in the activity.  Education:  Stress Management, Discharge Planning.   Education Outcome: Acknowledges edcuation/In group clarification offered/Needs additional education  Clinical Observations/Feedback: Pt did not attend group.      Brooks Kinnan, LRT/CTRS         Luretta Everly A 12/04/2017 12:40 PM 

## 2017-12-04 NOTE — Progress Notes (Addendum)
[facility] MD Progress Note  12/04/2017 1:31 PM Anne Baxter  MRN:  [id number] Subjective: patient states that she has been feeling better in general, but felt more depressed yesterday and today. States she had a visit from a friend yesterday, who told her that she should stop communicating with her BF altogether and limit interactions about their children to communications through a mediator or third Sermon. Patient states that this was " like a wake up call that it is really over", and states she felt more depressed, anxious afterwards. She states that by today she is feeling " a little better ".  Denies suicidal ideations . Denies medication side effects.  Objective : I have reviewed the chart notes and have met with patient. 27 year old female who presented to the hospital voluntarily due to worsening depression, anxiety, suicidal thoughts, which she attributed to significant degree to relationship stressor with her children's father. Presents alert, attentive, calm, vaguely depressed, anxious , but states feeling better this morning. As above, continues to tend to ruminate about her relationship with her children's father, which she acknowledges is detrimental for her " because it hurts my self esteem".  She has been visible on unit, some group participation.  Denies medication side effects.    Principal Problem: depression Diagnosis:   Patient Active Problem List   Diagnosis Date Noted  . MDD (major depressive disorder) [F32.9] 11/30/2017  . Severe recurrent major depression without psychotic features ([place]) [F33.2] 08/07/2017  . Pregnant [Z34.90] 08/07/2017  . Depression [F32.9] 09/20/2016  . History of migraine headaches [Z86.69] 03/16/2016   Total Time spent with patient: 20 minutes  Past Psychiatric History:   Past Medical History:  Past Medical History:  Diagnosis Date  . Anemia 2010   FE  . Depression   . Headache   . Infection 02/2017   CHLAMYDIA  . Pregnant   . Syncope    . Wrist fracture 2009 X2    Past Surgical History:  Procedure Laterality Date  . NO PAST SURGERIES     Family History:  Family History  Problem Relation Age of Onset  . Kidney disease Mother        FAILURE  . Heart disease Mother        CHF  . Early death Mother 67  . Hypertension Mother   . Thrombophlebitis Mother   . Hyperlipidemia Maternal Grandmother   . Hypertension Maternal Grandmother    Family Psychiatric  History:  Social History:  Social History   Substance and Sexual Activity  Alcohol Use No  . Alcohol/week: 0.0 oz   Comment: RARELY     Social History   Substance and Sexual Activity  Drug Use No    Social History   Socioeconomic History  . Marital status: Single    Spouse name: Not on file  . Number of children: 1  . Years of education: 28  . Highest education level: Not on file  Occupational History  . Occupation: CUSTOMER SERVICE    Employer: [employer]  . Financial resource strain: Not on file  . Food insecurity:    Worry: Not on file    Inability: Not on file  . Transportation needs:    Medical: Not on file    Non-medical: Not on file  Tobacco Use  . Smoking status: Never Smoker  . Smokeless tobacco: Never Used  Substance and Sexual Activity  . Alcohol use: No    Alcohol/week: 0.0 oz    Comment: RARELY  .  Drug use: No  . Sexual activity: Yes    Partners: Male    Birth control/protection: None  Lifestyle  . Physical activity:    Days per week: Not on file    Minutes per session: Not on file  . Stress: Not on file  Relationships  . Social connections:    Talks on phone: Not on file    Gets together: Not on file    Attends religious service: Not on file    Active member of club or organization: Not on file    Attends meetings of clubs or organizations: Not on file    Relationship status: Not on file  Other Topics Concern  . Not on file  Social History Narrative   Lives at home with boyfriend and daughter.    Right-handed.   No caffeine use.   Additional Social History:    Pain Medications: Please see MAR Prescriptions: Please see MAR Over the Counter: Please see MAR History of alcohol / drug use?: No history of alcohol / drug abuse Longest period of sobriety (when/how long): N/A  Sleep: Good  Appetite:  Good  Current Medications: Current Facility-Administered Medications  Medication Dose Route Frequency Provider Last Rate Last Dose  . acetaminophen (TYLENOL) tablet 650 mg  650 mg Oral Q6H PRN Mordecai Maes, NP   650 mg at 12/04/17 1201  . alum & mag hydroxide-simeth (MAALOX/MYLANTA) 200-200-20 MG/5ML suspension 30 mL  30 mL Oral Q4H PRN Mordecai Maes, NP      . citalopram (CELEXA) tablet 20 mg  20 mg Oral QHS Lichelle Viets, Myer Peer, MD   20 mg at 12/03/17 2135  . propranolol (INDERAL) tablet 10 mg  10 mg Oral BID Rankin, Shuvon B, NP   10 mg at 12/04/17 1224    Lab Results:  No results found for this or any previous visit (from the past 48 hour(s)).  Blood Alcohol level:  No results found for: [facility]  Metabolic Disorder Labs: Lab Results  Component Value Date   HGBA1C 5.5 12/01/2017   MPG 111.15 12/01/2017   No results found for: PROLACTIN Lab Results  Component Value Date   CHOL 151 12/01/2017   TRIG 23 12/01/2017   HDL 72 12/01/2017   CHOLHDL 2.1 12/01/2017   VLDL 5 12/01/2017   LDLCALC 74 12/01/2017    Physical Findings: AIMS:  , ,  ,  ,    CIWA:    COWS:     Musculoskeletal: Strength & Muscle Tone: within normal limits Gait & Station: normal Patient leans: N/A  Psychiatric Specialty Exam: Physical Exam  ROS no headache, no chest pain, no shortness of breath, no vomiting  Blood pressure 123/62, pulse 95, temperature 98.2 F (36.8 C), temperature source Oral, resp. rate 16, height 5' 6"  (1.676 m), weight 54.4 kg (120 lb), last menstrual period 04/22/2017, SpO2 100 %, unknown if currently breastfeeding.Body mass index is 19.37 kg/m.  General Appearance:  Well-groomed  Eye Contact:  Good  Speech:  Normal Rate  Volume:  Normal  Mood:  remains vaguely depressed, sad, but states feels better today  Affect:  constricted but improves during session  Thought Process:  Linear and Descriptions of Associations: Intact  Orientation:  Full (Time, Place, and [place])  Thought Content:  No hallucinations, no delusions, future oriented.  Not internally preoccupied  Suicidal Thoughts:  No-at this time denies suicidal ideations, denies self-injurious ideations, denies homicidal or violent ideations , contracts for safety on unit  Homicidal Thoughts:  No  Memory:  Recent and remote grossly intact  Judgement:  Other:  Improving  Insight:  Improving  Psychomotor Activity:  Normal  Concentration:  Concentration: Good and Attention Span: Good  Recall:  Good  Fund of Knowledge:  Good  Language:  Good  Akathisia:  Negative  Handed:  Right  AIMS (if indicated):     Assets:  Communication Skills Desire for Improvement Resilience  ADL's:  Intact  Cognition:  WNL  Sleep:  Number of Hours: 6.75   Assessment - patient reports increased depression, sadness following a visit from a friend yesterday who told her she really should try to completely avoid the father of her children and keep their relationship at the minimum. States " it kind of hit me hard, I think I realize my friend is right". Of note, denies suicidal ideations, states she is already " starting to feel better again", and remains future oriented, hoping to discharge soon in order to reunite with her children. Denies current suicidal ideations.  Denies medication side effects at this time  Treatment Plan Summary: Daily contact with patient to assess and evaluate symptoms and progress in treatment, Medication management, Plan inpatient treatmetn and medications as below  Treatment plan reviewed as below today July 15 Encourage ongoing group and milieu participation to work on Radiographer, therapeutic and symptom  reduction. Treatment team working on disposition planning Continue Celexa  20 mgrs QHS dosing for depression ( night time dosing to address sedation) Continue Inderal 10 mgrs BID for anxiety Treatment Team working on disposition planning options Jenne Campus, MD 12/04/2017, 1:31 PM   Patient ID: Anne Baxter, female   DOB: 05-May-1991, 27 y.o.   MRN: [id number]

## 2017-12-04 NOTE — Progress Notes (Signed)
Patient ID: Anne Baxter, female   DOB: Mar 04, 1991, 27 y.o.   MRN: [id number]   D: Patient denies SI/HI and auditory and visual hallucinations. Patient has a depressed mood and affect. Patient stated that she felt that she is not ready to go home yet.  A: Patient given emotional support from RN. Patient given medications per MD orders. Patient encouraged to attend groups and unit activities. Patient encouraged to come to staff with any questions or concerns.   R: Patient remains cooperative and appropriate. Patient to stay another day. Will continue to monitor patient for safety.

## 2017-12-05 NOTE — B[facility] Suicide Risk Assessment (Signed)
[facility] Discharge Suicide Risk Assessment   Principal Problem: MDD (major depressive disorder) Discharge Diagnoses:  Patient Active Problem List   Diagnosis Date Noted  . MDD (major depressive disorder) [F32.9] 11/30/2017  . Severe recurrent major depression without psychotic features (HCC) [F33.2] 08/07/2017  . Pregnant [Z34.90] 08/07/2017  . Depression [F32.9] 09/20/2016  . History of migraine headaches [Z86.69] 03/16/2016    Total Time spent with patient: 30 minutes  Musculoskeletal: Strength & Muscle Tone: within normal limits Gait & Station: normal Patient leans: N/A  Psychiatric Specialty Exam: ROS denies headache, no chest pain, no shortness of breath, no vomiting   Blood pressure 130/89, pulse (!) 102, temperature 98.7 F (37.1 C), temperature source Oral, resp. rate 16, height 5\' 6"  (1.676 m), weight 54.4 kg (120 lb), last menstrual period 04/22/2017, SpO2 100 %, unknown if currently breastfeeding.Body mass index is 19.37 kg/m.  General Appearance: improving grooming   Eye Contact::  improved   Speech:  Normal Rate409  Volume:  Normal  Mood:  improving mood, states she feels better   Affect:  more reactive, smiles at times appropriately during session  Thought Process:  Linear and Descriptions of Associations: Intact  Orientation:  Full (Time, Place, and [place])  Thought Content:  denies hallucinations,no delusions expressed   Suicidal Thoughts:  No denies suicidal or self injurious ideations, denies any homicidal or violent ideations  Homicidal Thoughts:  No  Memory:  recent and remote grossly intact   Judgement:  Other:  improving   Insight:  improving   Psychomotor Activity:  Normal  Concentration:  Good  Recall:  Good  Fund of Knowledge:Good  Language: Good  Akathisia:  Negative  Handed:  Right  AIMS (if indicated):     Assets:  Communication Skills Desire for Improvement Resilience  Sleep:  Number of Hours: 5.75  Cognition: WNL  ADL's:  Intact   Mental  Status Per Nursing Assessment::   On Admission:  Suicidal ideation indicated by patient  Demographic Factors:  27 year old single female, single, has two children, currently unemployed   Loss Factors: Reports relationship stressors with the father of her children, unemployment  Historical Factors: History of depression and anxiety, history of prior psychiatric admission  Risk Reduction Factors:   Responsible for children under 10 years of age, Sense of responsibility to family, Living with another [place], especially a relative and Positive coping skills or problem solving skills  Continued Clinical Symptoms:  At this time patient is alert, attentive, well related, pleasant on approach, reports feeling better, and currently describes her mood as improved, affect is more reactive and no currently anxious, no thought disorder, no suicidal or self injurious ideations, no homicidal or violent ideations, no hallucinations, no delusions,not internally preoccupied . Denies medication side effects. Behavior on unit in good control, calm.    Cognitive Features That Contribute To Risk:  No gross cognitive deficits noted upon discharge. Is alert , attentive, and oriented x 3   Suicide Risk:  Mild:  Suicidal ideation of limited frequency, intensity, duration, and specificity.  There are no identifiable plans, no associated intent, mild dysphoria and related symptoms, good self-control (both objective and subjective assessment), few other risk factors, and identifiable protective factors, including available and accessible social support.  Follow-up Information    [facility] Go on 12/06/2017.   Why:  Please attend your medication appt with Dr Jannifer Franklin on Wednesday, 12/06/17, at 9:45am.  Please attend your therapy appt with Graylin Shiver on Tuesday, 12/26/17, at 11:00am.  Contact information: [address] [place] [address] [phone]           Plan Of  Care/Follow-up recommendations:  Activity:  inpatient treatment Diet:  regular Tests:  NA Other:  See below  Patient is expressing readiness for discharge and is leaving unit in good spirits  Plans to return home Plans to follow up as above .   Craige CottaFernando A Lemmie Steinhaus, MD 12/05/2017, 2:58 PM

## 2017-12-05 NOTE — Progress Notes (Signed)
  [facility]  Will you be returning to the same living situation after discharge:  Yes,  other relatives At discharge, do you have transportation home?: Yes,  friend Do you have the ability to pay for your medications: Yes,  [employer]  Release of information consent forms completed and in the chart;  Patient's signature needed at discharge.  Patient to Follow up at: Follow-up Information    [facility] Go on 12/06/2017.   Why:  Please attend your medication appt with Dr Jannifer FranklinAkintayo on Wednesday, 12/06/17, at 9:45am.  Please attend your therapy appt with Graylin ShiverVictoria Lewis on Tuesday, 12/26/17, at 11:00am. Contact information: [address] [place] [address] [phone]           Next level of care provider [facility]:no  Safety Planning and Suicide Prevention discussed: Yes,  with friend     Has patient been referred to the Quitline?: N/A patient is not a smoker  Patient has been referred for addiction treatment: Pt. refused referral  Lorri FrederickWierda, Valeria Boza Jon, LCSW 12/05/2017, 3:53 PM

## 2017-12-05 NOTE — [facility] Group Notes (Signed)
[facility] LCSW Group Therapy Note  Date/Time: 12/05/17, 1315  Type of Therapy/Topic:  Group Therapy:  Feelings about Diagnosis  Participation Level:  Did Not Attend   Mood:   Description of Group:    This group will allow patients to explore their thoughts and feelings about diagnoses they have received. Patients will be guided to explore their level of understanding and acceptance of these diagnoses. Facilitator will encourage patients to process their thoughts and feelings about the reactions of others to their diagnosis, and will guide patients in identifying ways to discuss their diagnosis with significant others in their lives. This group will be process-oriented, with patients participating in exploration of their own experiences as well as giving and receiving support and challenge from other group members.   Therapeutic Goals: 1. Patient will demonstrate understanding of diagnosis as evidence by identifying two or more symptoms of the disorder:  2. Patient will be able to express two feelings regarding the diagnosis 3. Patient will demonstrate ability to communicate their needs through discussion and/or role plays  Summary of Patient Progress:        Therapeutic Modalities:   Cognitive Behavioral Therapy Brief Therapy Feelings Identification   Greg Chalsea Darko, LCSW 

## 2017-12-05 NOTE — Progress Notes (Signed)
Pt denies SI/HI. Pt appears euthymic on approach. Pt denies any side effects to medications.   Orders reviewed with pt. Verbal support provided.  15 minute checks performed for safety.   Pt verbalized readiness for discharge today.  

## 2017-12-05 NOTE — Progress Notes (Signed)
Recreation Therapy Notes  Animal-Assisted Activity (AAA) Program Checklist/Progress Notes Patient Eligibility Criteria Checklist & Daily Group note for Rec Tx Intervention  Date: 7.16.19 Time: 1430 [address]   AAA/T Program Assumption of Risk Form signed by Engineer, productionatient/ or Parent Legal Guardian YES   Patient is free of allergies or sever asthma YES   Patient reports no fear of animals YES   Patient reports no history of cruelty to animals YES   Patient understands his/her participation is voluntary YES   Patient washes hands before animal contact  YES  Patient washes hands after animal contact  YES   Behavioral Response: Engaged  Education: Charity fundraiserHand Washing, Appropriate Animal Interaction   Education Outcome: Acknowledges understanding/In group clarification offered/Needs additional education.   Clinical Observations/Feedback: Pt attended activity.    Caroll RancherMarjette Teandre Hamre, LRT/CTRS         Lillia AbedLindsay, Ayani Ospina A 12/05/2017 3:01 PM

## 2017-12-05 NOTE — Progress Notes (Signed)
Pt received both written and verbal discharge instructions. Pt verbalized understanding of discharge instructions. Pt agreed to f/u appt and med. Pt received d/c packet and prescriptions. Pt gathered belongings from room and locker. Pt safely discharged to the lobby.

## 2018-07-27 IMAGING — US US MFM OB FOLLOW-UP
1 series · 14 of 28 positions shown · non-contrast
Comparison: none

[Series 1: us mfm ob follow-up · 64 acquisitions, 14 frames shown]
[im 3/64]
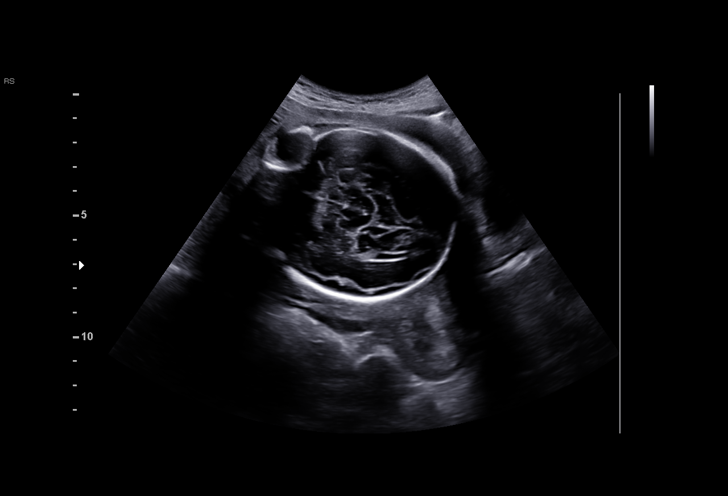
[im 8/64]
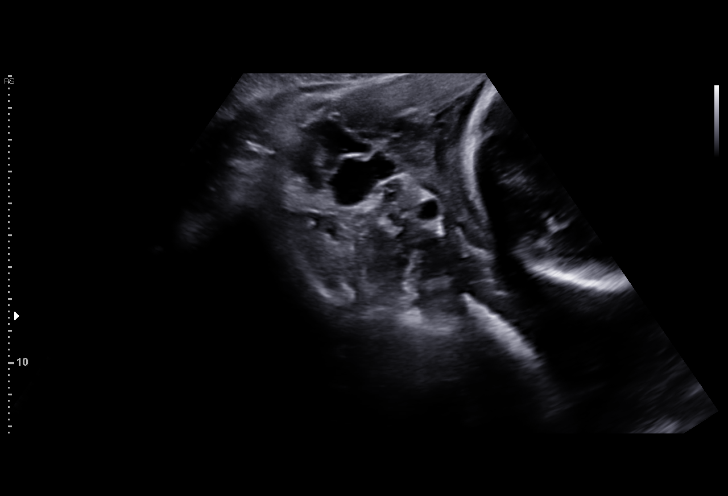
[im 12/64]
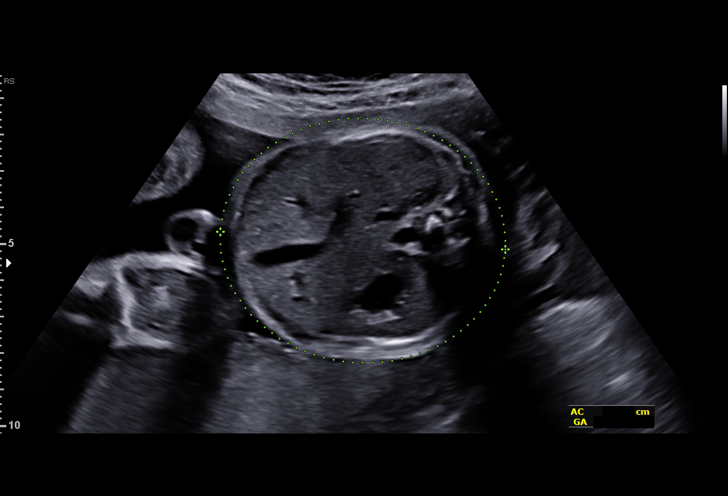
[im 17/64]
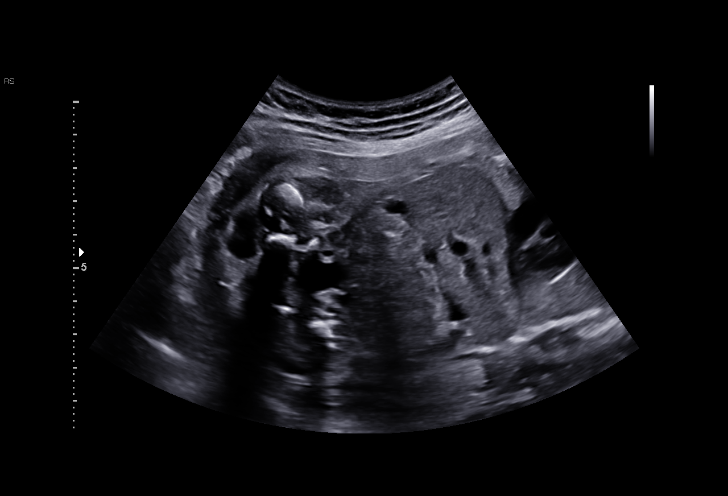
[im 22/64]
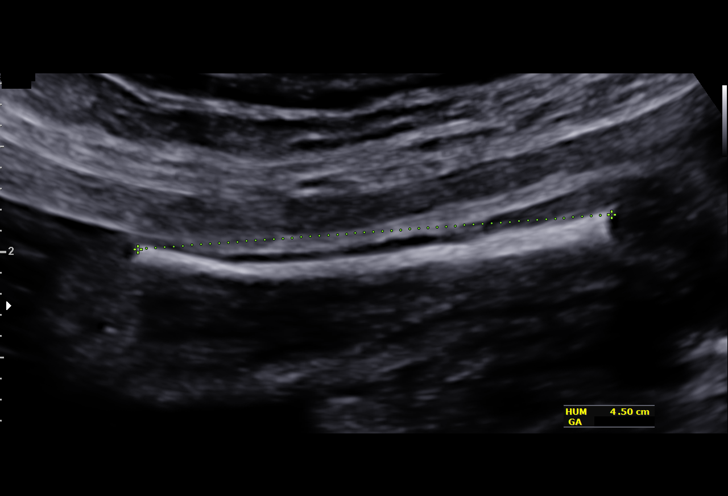
[im 26/64]
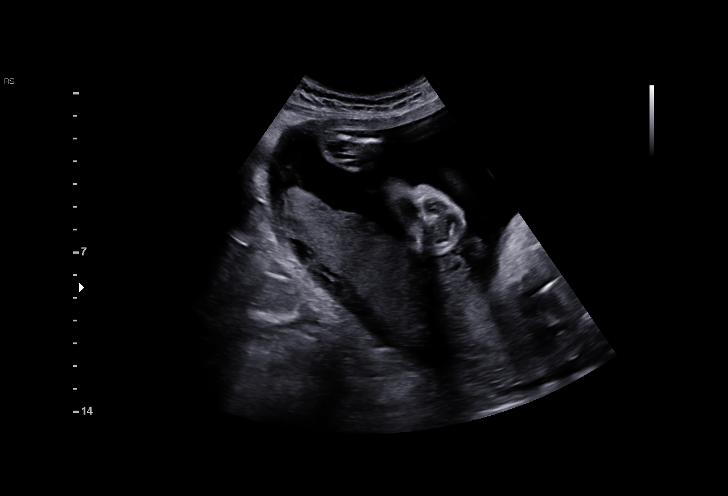
[im 31/64]
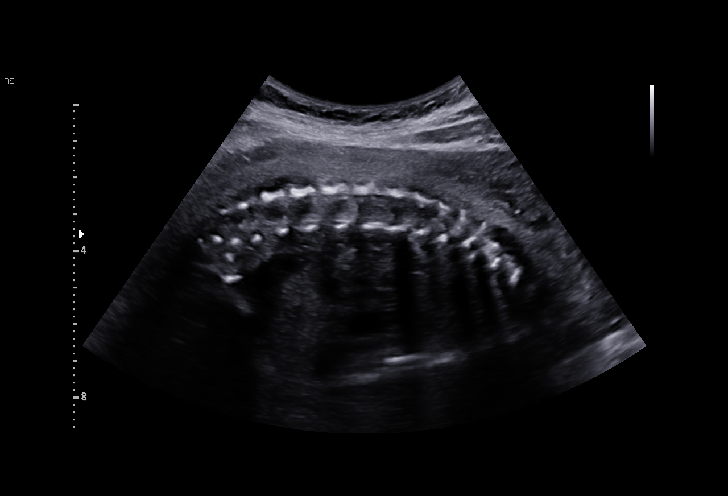
[im 36/64]
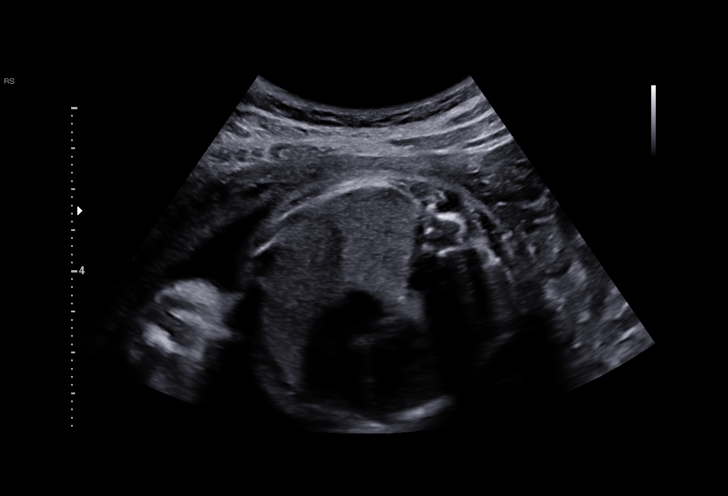
[im 40/64]
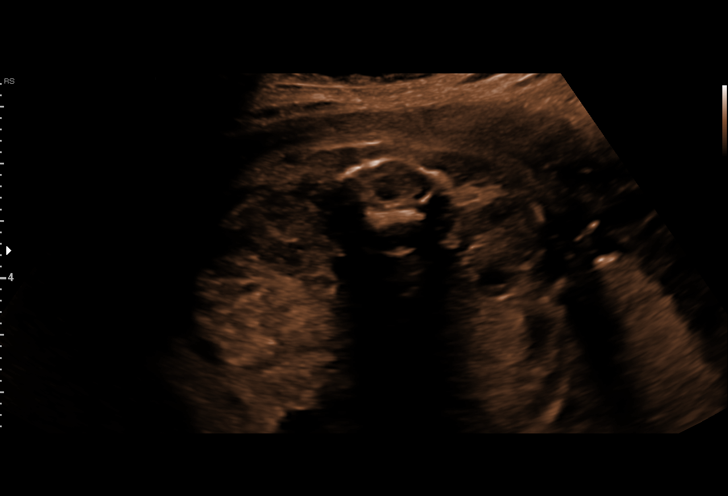
[im 45/64]
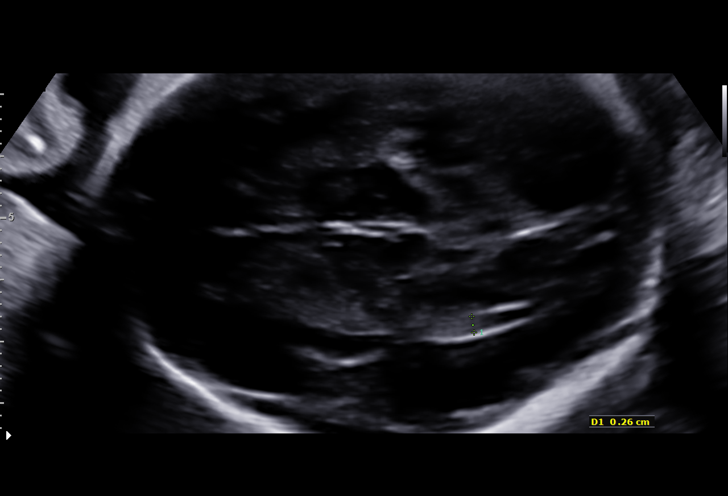
[im 50/64]
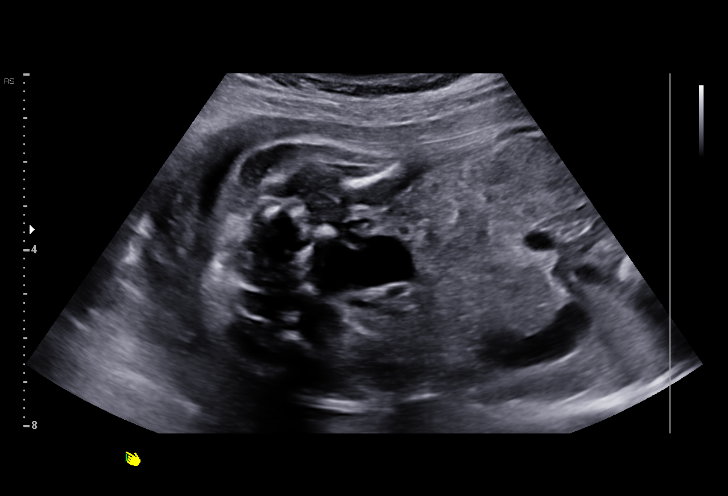
[im 54/64]
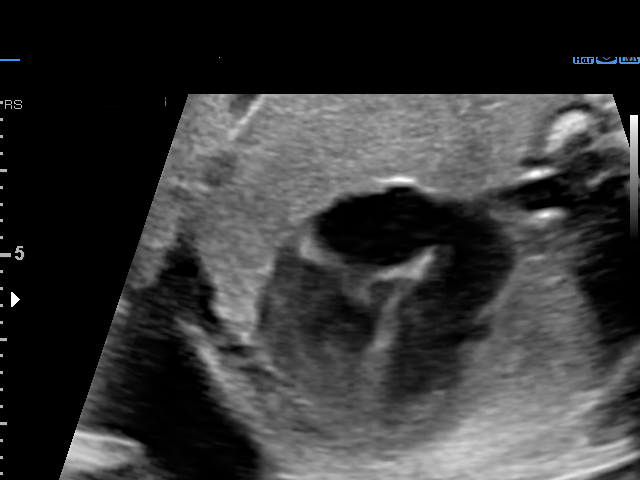
[im 59/64]
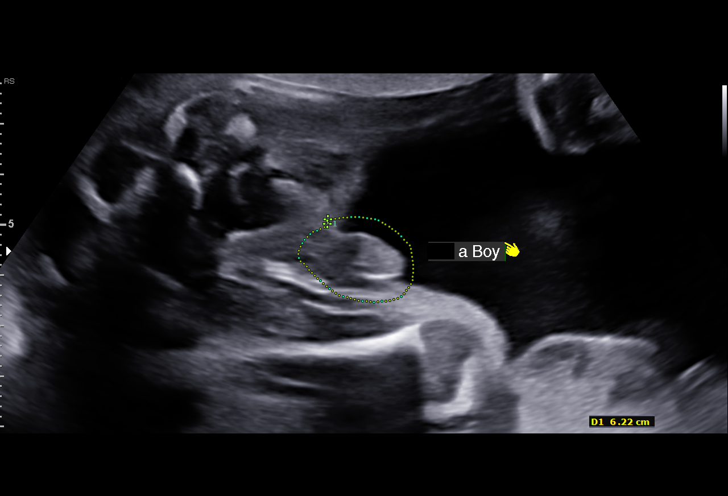
[im 64/64]
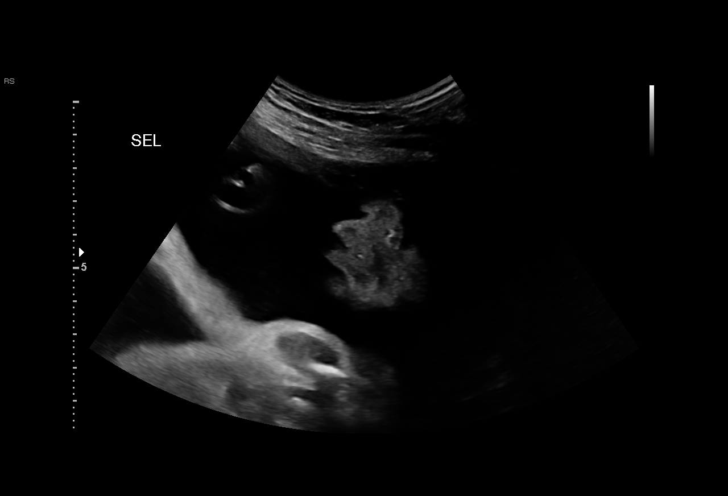

[14 of 28 positions shown; findings below may reference images not displayed]

1  JOERGEN KUNDURU            [id number]      [id number]     [id number]
Indications

27 weeks gestation of pregnancy
Encounter for other antenatal screening
follow-up
OB History

Blood Type:            Height:  5'6"   Weight (lb):  132       BMI:
Gravidity:    3         Term:   1        Prem:   0        SAB:   0
TOP:          1       Ectopic:  0        Living: 1
Fetal Evaluation

Num Of Fetuses:     1
Fetal Heart         142
Rate(bpm):
Cardiac Activity:   Observed
Presentation:       Cephalic
Placenta:           Posterior, above cervical os
P. Cord Insertion:  Previously Visualized

Amniotic Fluid
AFI FV:      Subjectively within normal limits

Largest Pocket(cm)
7.52
Biometry

BPD:      69.4  mm     G. Age:  27w 6d         59  %    CI:        71.29   %    70 - 86
FL/HC:      18.7   %    18.6 -
HC:      261.8  mm     G. Age:  28w 3d         60  %    HC/AC:      1.16        1.05 -
AC:      226.5  mm     G. Age:  27w 0d         35  %    FL/BPD:     70.5   %    71 - 87
FL:       48.9  mm     G. Age:  26w 3d         15  %    FL/AC:      21.6   %    20 - 24
HUM:      45.5  mm     G. Age:  26w 6d         38  %

Est. FW:    9198  gm      2 lb 4 oz     47  %
Gestational Age

LMP:           27w 2d        Date:  09/16/15                 EDD:   06/22/16
U/S Today:     27w 3d                                        EDD:   06/21/16
Best:          27w 2d     Det. By:  LMP  (09/16/15)          EDD:   06/22/16
Anatomy

Cranium:               Appears normal         Aortic Arch:            Previously seen
Cavum:                 Previously seen        Ductal Arch:            Previously seen
Ventricles:            Appears normal         Diaphragm:              Appears normal
Choroid Plexus:        Previously seen        Stomach:                Appears normal, left
sided
Cerebellum:            Previously seen        Abdomen:                Appears normal
Posterior Fossa:       Previously seen        Abdominal Wall:         Previously seen
Nuchal Fold:           Not applicable (>20    Cord Vessels:           Previously seen
wks GA)
Face:                  Orbits and profile     Kidneys:                Appear normal
previously seen
Lips:                  Previously seen        Bladder:                Appears normal
Thoracic:              Appears normal         Spine:                  Appears normal
Heart:                 Previously seen        Upper Extremities:      Previously seen
RVOT:                  Previously seen        Lower Extremities:      Previously seen
LVOT:                  Appears normal

Other:  Male gender. Technically difficult due to fetal position.
Cervix Uterus Adnexa

Cervix
Length:            3.7  cm.
Normal appearance by transabdominal scan.

Uterus
No abnormality visualized.

Left Ovary
Within normal limits.

Right Ovary
Not visualized.

Adnexa:       No abnormality visualized. No adnexal mass
visualized.
Impression

Singleton intrauterine pregnancy at 27+2 weeks
Review of the anatomy shows no sonographic markers for
aneuploidy or structural anomalies
However, evaluations should be considered suboptimal
secondary to fetal position
Amniotic fluid volume is normal with a MVP of 7.5cm
Estimated fetal weight is 1016g which is growth in the 47th
percentile
Recommendations

All relevant anatomy has been visualized. growth is normal.
Follow-up ultrasounds as clinically indicated.

## 2018-12-19 ENCOUNTER — Ambulatory Visit: Payer: Medicaid Other | Admitting: Obstetrics

## 2019-01-03 ENCOUNTER — Other Ambulatory Visit: Payer: Self-pay

## 2019-01-03 ENCOUNTER — Ambulatory Visit: Payer: Medicaid Other | Admitting: Obstetrics & Gynecology

## 2019-01-03 ENCOUNTER — Other Ambulatory Visit (HOSPITAL_COMMUNITY)
Admission: RE | Admit: 2019-01-03 | Discharge: 2019-01-03 | Disposition: A | Discharge status: Home / Self Care | Payer: Medicaid Other | Source: Ambulatory Visit | Attending: Obstetrics | Admitting: Obstetrics

## 2019-01-03 VITALS — BP 134/84 | HR 102 | Ht 66.0 in | Wt 122.0 lb

## 2019-01-03 DIAGNOSIS — Z0000 Encounter for general adult medical examination without abnormal findings: Secondary | ICD-10-CM

## 2019-01-03 DIAGNOSIS — Z30013 Encounter for initial prescription of injectable contraceptive: Secondary | ICD-10-CM

## 2019-01-03 DIAGNOSIS — Z01419 Encounter for gynecological examination (general) (routine) without abnormal findings: Secondary | ICD-10-CM | POA: Insufficient documentation

## 2019-01-03 MED ORDER — MEDROXYPROGESTERONE ACETATE 150 MG/ML IM SUSP: 150.0000 mg | INTRAMUSCULAR | 0 refills | Status: DC

## 2019-01-03 MED ORDER — MEDROXYPROGESTERONE ACETATE 150 MG/ML IM SUSP
150.0000 mg | Freq: Once | INTRAMUSCULAR | Status: AC
  Administered 2019-01-03: 13:00:00 150 mg via INTRAMUSCULAR

## 2019-01-03 NOTE — Progress Notes (Signed)
Patient ID: Anne Baxter, female   DOB: 1990/07/11, 28 y.o.   MRN: [id number]  Chief Complaint  Patient presents with  . Gynecologic Exam  start depo provera  HPI Anne Baxter is a 28 y.o. female.  [id number] Patient's last menstrual period was 12/21/2018. She would like to start Depo Provera, not having used any contraception since her last delivery 2 years ago. She has used DMPA in the past.. Declined other methods HPI  Past Medical History:  Diagnosis Date  . Anemia 2010   FE  . Depression   . Headache   . Infection 02/2017   CHLAMYDIA  . Pregnant   . Syncope   . Wrist fracture 2009 X2    Past Surgical History:  Procedure Laterality Date  . NO PAST SURGERIES      Family History  Problem Relation Age of Onset  . Kidney disease Mother        FAILURE  . Heart disease Mother        CHF  . Early death Mother 37  . Hypertension Mother   . Thrombophlebitis Mother   . Hyperlipidemia Maternal Grandmother   . Hypertension Maternal Grandmother     Social History Social History   Tobacco Use  . Smoking status: Never Smoker  . Smokeless tobacco: Never Used  Substance Use Topics  . Alcohol use: No    Alcohol/week: 0.0 standard drinks    Comment: RARELY  . Drug use: No    No Known Allergies  Current Outpatient Medications  Medication Sig Dispense Refill  . citalopram (CELEXA) 20 MG tablet Take 1 tablet (20 mg total) by mouth at bedtime. For depression (Patient not taking: Reported on 01/03/2019) 30 tablet 0  . propranolol (INDERAL) 10 MG tablet Take 1 tablet (10 mg total) by mouth 2 (two) times daily. For anxiety (Patient not taking: Reported on 01/03/2019) 60 tablet 0   Current Facility-Administered Medications  Medication Dose Route Frequency Provider Last Rate Last Dose  . medroxyPROGESTERone (DEPO-PROVERA) injection 150 mg  150 mg Intramuscular Once Woodroe Mode, MD        Review of Systems Review of Systems  Constitutional: Negative.   Respiratory:  Negative.   Cardiovascular: Negative.   Endocrine: Negative.   Genitourinary: Negative.  Negative for menstrual problem.    Blood pressure 134/84, pulse (!) 102, height 5\' 6"  (1.676 m), weight 122 lb (55.3 kg), last menstrual period 12/21/2018, unknown if currently breastfeeding.  Physical Exam Physical Exam Constitutional:      Appearance: Normal appearance.  Cardiovascular:     Rate and Rhythm: Normal rate.     Heart sounds: Normal heart sounds.  Pulmonary:     Breath sounds: Normal breath sounds.  Abdominal:     General: Abdomen is flat.     Palpations: Abdomen is soft.     Tenderness: There is no abdominal tenderness.  Genitourinary:    General: Normal vulva.     Comments: Pelvic exam: normal external genitalia, vulva, vagina, cervix, uterus and adnexa.  Neurological:     Mental Status: She is alert.    Breasts: breasts appear normal, no suspicious masses, no skin or nipple changes or axillary nodes.  Data Reviewed Pap nl 2017  Assessment Normal well woman exam Start DMPA at her request  Plan DMPA every 3 months Breast self exam encouraged    [name] 01/03/2019, 12:08 PM

## 2019-01-03 NOTE — Progress Notes (Signed)
Pt would like to start on Depo.   Last intercourse was last week. LMP 12/21/2018.  Pt request all STD screening, including labs. Last pap smear 11/2015.

## 2019-01-04 LAB — CERVICOVAGINAL ANCILLARY ONLY
Chlamydia: NEGATIVE
Neisseria Gonorrhea: NEGATIVE
Trichomonas: NEGATIVE

## 2019-01-04 LAB — HEPATITIS C ANTIBODY: Hep C Virus Ab: <0.1 s/co ratio (ref 0.0–0.9)

## 2019-01-04 LAB — HIV ANTIBODY (ROUTINE TESTING W REFLEX): HIV Screen 4th Generation wRfx: NONREACTIVE

## 2019-01-04 LAB — HEPATITIS B SURFACE ANTIGEN: Hepatitis B Surface Ag: NEGATIVE

## 2019-01-04 LAB — RPR: RPR Ser Ql: NONREACTIVE

## 2019-01-07 LAB — CYTOLOGY - PAP

## 2019-01-14 ENCOUNTER — Other Ambulatory Visit: Payer: Self-pay | Admitting: Obstetrics

## 2019-01-14 ENCOUNTER — Telehealth: Payer: Self-pay

## 2019-01-14 DIAGNOSIS — G43009 Migraine without aura, not intractable, without status migrainosus: Secondary | ICD-10-CM

## 2019-01-14 MED ORDER — BUTALBITAL-APAP-CAFFEINE 50-325-40 MG PO TABS: 2.0000 | ORAL_TABLET | Freq: Four times a day (QID) | ORAL | 2 refills | Status: DC | PRN

## 2019-01-14 NOTE — Telephone Encounter (Signed)
Returned call to patient regarding Appt for COLPO on 01/22/2019, she verbalized understanding and that she will be at her appt.

## 2019-01-22 ENCOUNTER — Encounter: Payer: Medicaid Other | Admitting: Obstetrics & Gynecology

## 2019-01-24 ENCOUNTER — Ambulatory Visit (HOSPITAL_COMMUNITY)
Admission: RE | Admit: 2019-01-24 | Discharge: 2019-01-24 | Disposition: A | Discharge status: Home / Self Care | Payer: Medicaid Other | Attending: Psychiatry | Admitting: Psychiatry

## 2019-01-25 NOTE — H&P (Signed)
Behavioral Health Medical Screening Exam  Anne Baxter is an 28 y.o. female that presents to [facility] unaccompanied. Patient is sitting in the waiting room appropriately dressed with her head hanging down.  She is very tearful during the initial encounter.  When asked, "what brings you in tonight?", patient began to cry.  Pt stated that she has not been feeling herself.  She stated that for the last couple of weeks she has been very depressed.  She stated she has two young children 2, and 6 and admits that lately, the role of parenting has been overwhelming. She's not sleeping well, her appetite has decreased and she is no longer motivated to do things outside of the house.  She no longer wants to interact with friends because she is tired of being seen as the "Debbie downer".  She has been having fleeting thoughts of suicide but stated she understands her triggers and know what do when they present themselves.  She used to see Dr. Loni Muse on [address] but admits she hasnt seen him in over a year.  She is no longer on antidepressants and stated that she stopped when she was feeling better. Writer educated patient on the risk of stopping anti-depressants without proper instructions.  Writer explained the rebound effect of some medication.  Advised patient to always talk to her practitioner about side effects and wanting to stop any medication prescribed.  Patient understood and verbaly agreed with the instructions provided.   Advised patient of recommendation for psychiatric observation unit stay.  Patient declined admission.  Patient stated that the wait in the waiting room and the talk/procesing was enough to calm herself down.  Patient was able to contract for safety and given resources to follow up with care on an outpatient basis.    Total Time spent with patient: 45 minutes  Psychiatric Specialty Exam: Physical Exam  Constitutional: She is oriented to [place], place, and time. She appears well-developed.   HENT:  Head: Normocephalic.  Eyes: Pupils are equal, round, and reactive to light.  Neck: Normal range of motion.  Respiratory: Effort normal.  GI: Soft. Bowel sounds are normal.  Musculoskeletal: Normal range of motion.  Neurological: She is alert and oriented to [place], place, and time.  Skin: Skin is warm and dry.    ROS  unknown if currently breastfeeding.There is no height or weight on file to calculate BMI.  General Appearance: Casual  Eye Contact:  Good  Speech:  Normal Rate  Volume:  Normal  Mood:  Depressed  Affect:  Congruent  Thought Process:  Coherent and Descriptions of Associations: Intact  Orientation:  Full (Time, Place, and [place])  Thought Content:  WDL  Suicidal Thoughts:  Yes.  without intent/plan  Homicidal Thoughts:  No  Memory:  Immediate;   Fair  Judgement:  Fair  Insight:  Fair  Psychomotor Activity:  Normal  Concentration: Concentration: Good  Recall:  Good  Fund of Knowledge:Good  Language: Good  Akathisia:  NA  Handed:  Right  AIMS (if indicated):     Assets:  Communication Skills Desire for Improvement Housing  Sleep:       Musculoskeletal: Strength & Muscle Tone: within normal limits Gait & Station: normal Patient leans: N/A  unknown if currently breastfeeding.  Recommendations:  Based on my evaluation the patient does not appear to have an emergency medical condition.  Patient turned down recommendation for observation admission. Patient was made aware of outpatient resources and was able to contract for  safety.    Jearld Leschashaun M Timur Nibert, NP 01/25/2019, 6:25 AM

## 2019-01-30 ENCOUNTER — Telehealth: Payer: Self-pay | Admitting: Obstetrics & Gynecology

## 2019-03-20 ENCOUNTER — Other Ambulatory Visit: Payer: Self-pay

## 2019-03-20 ENCOUNTER — Ambulatory Visit: Payer: Medicaid Other | Admitting: Obstetrics & Gynecology

## 2019-03-20 ENCOUNTER — Other Ambulatory Visit (HOSPITAL_COMMUNITY)
Admission: RE | Admit: 2019-03-20 | Discharge: 2019-03-20 | Disposition: A | Discharge status: Home / Self Care | Payer: Medicaid Other | Source: Ambulatory Visit | Attending: Obstetrics & Gynecology | Admitting: Obstetrics & Gynecology

## 2019-03-20 ENCOUNTER — Encounter: Payer: Self-pay | Admitting: Obstetrics & Gynecology

## 2019-03-20 DIAGNOSIS — R87612 Low grade squamous intraepithelial lesion on cytologic smear of cervix (LGSIL): Secondary | ICD-10-CM | POA: Insufficient documentation

## 2019-03-20 DIAGNOSIS — N870 Mild cervical dysplasia: Secondary | ICD-10-CM

## 2019-03-20 NOTE — Progress Notes (Signed)
Patient ID: Anne Baxter, female   DOB: [birth year], 28 y.o.   MRN: [id number]  Chief Complaint  Patient presents with  . Colposcopy    HPI Anne Baxter is a 28 y.o. female.  [id number] Patient has received Depo Provera HPI  Indications: Pap smear on August 2020 showed: low-grade squamous intraepithelial neoplasia (LGSIL - encompassing HPV,mild dysplasia,CIN I). Previous colposcopy: no. Prior cervical treatment: no treatment.  Past Medical History:  Diagnosis Date  . Anemia 2010   FE  . Depression   . Headache   . Infection 02/2017   CHLAMYDIA  . Pregnant   . Syncope   . Wrist fracture 2009 X2    Past Surgical History:  Procedure Laterality Date  . NO PAST SURGERIES      Family History  Problem Relation Age of Onset  . Kidney disease Mother        FAILURE  . Heart disease Mother        CHF  . Early death Mother 40  . Hypertension Mother   . Thrombophlebitis Mother   . Hyperlipidemia Maternal Grandmother   . Hypertension Maternal Grandmother     Social History Social History   Tobacco Use  . Smoking status: Never Smoker  . Smokeless tobacco: Never Used  Substance Use Topics  . Alcohol use: No    Alcohol/week: 0.0 standard drinks    Comment: RARELY  . Drug use: No    No Known Allergies  Current Outpatient Medications  Medication Sig Dispense Refill  . butalbital-acetaminophen-caffeine (FIORICET) 50-325-40 MG tablet Take 2 tablets by mouth every 6 (six) hours as needed for headache or migraine. 30 tablet 2  . citalopram (CELEXA) 20 MG tablet Take 1 tablet (20 mg total) by mouth at bedtime. For depression (Patient not taking: Reported on 01/03/2019) 30 tablet 0  . propranolol (INDERAL) 10 MG tablet Take 1 tablet (10 mg total) by mouth 2 (two) times daily. For anxiety (Patient not taking: Reported on 01/03/2019) 60 tablet 0   No current facility-administered medications for this visit.     Review of Systems Review of Systems  Gastrointestinal: Negative.    Genitourinary: Positive for vaginal bleeding (spotting after DMPA). Negative for pelvic pain and vaginal discharge.    Blood pressure 125/85, pulse 83, height 5\' 6"  (1.676 m), weight 119 lb (54 kg), unknown if currently breastfeeding.  Physical Exam Physical Exam Vitals signs and nursing note reviewed.  Constitutional:      Appearance: Normal appearance.  Cardiovascular:     Rate and Rhythm: Normal rate.  Pulmonary:     Effort: Pulmonary effort is normal.  Genitourinary:    General: Normal vulva.     Vagina: Vaginal discharge (scant brown spotting, cervix was normal) present.  Neurological:     Mental Status: She is alert.     Data Reviewed Pap 01/03/2019  Assessment    Procedure Details  The risks and benefits of the procedure and Written informed consent obtained.  Speculum placed in vagina and excellent visualization of cervix achieved, cervix swabbed x 3 with acetic acid solution. Very minor HPV changes, SCJ and TZ seen no discrete lesions Specimens: ECC and Bx at 12 and 6. Mosel's solution applied  Complications: none.     Plan    Specimens labelled and sent to Pathology.  Pathology results in 2 weeks. Patient has MyChart      [name] 03/20/2019, 8:51 AM

## 2019-03-20 NOTE — Patient Instructions (Signed)
Cervical Dysplasia  Cervical dysplasia is a condition in which a woman's cervix cells have abnormal changes. The cervix is the opening of the uterus (womb). It is located between the vagina and the uterus. Cervical dysplasia may be an early sign of cervical cancer. If left untreated, this condition may become more severe and may progress to cervical cancer. Early detection, treatment, and follow-up care are very important. What are the causes? Cervical dysplasia can be caused by a human papillomavirus (HPV) infection. HPV is the most common sexually transmitted infection (STI). HPV is spread from [place] to [place] through sexual contact. This includes oral, vaginal, or anal sex. What increases the risk? The following factors may make you more likely to develop this condition:  Having had a sexually transmitted infection (STI), such as herpes, chlamydia or HPV.  Becoming sexually active before age 18.  Having had more than one sexual partner.  Having a sexual partner who has multiple sexual partners.  Not using protection, such as a condom, during sex, especially with new sexual partners.  Having a history of cancer of the vagina or vulva.  Having a weakened body defense (immune) system.  Being the daughter of a woman who took diethylstilbestrol (DES) during pregnancy.  Having a family history of cervical cancer.  Smoking.  Using oral contraceptives, also called birth control pills.  Having had three or more full-term pregnancies. What are the signs or symptoms? There are usually no symptoms of this condition. If you do have symptoms, they may include:  Abnormal vaginal discharge.  Bleeding between periods or after sex.  Bleeding during menopause.  Pain during sex (dyspareunia). How is this diagnosed? A test called a Pap test may be done. During this test, cells are taken from the cervix and then examined under a microscope. A test in which tissue is removed from the cervix  (biopsy) may also be done if the Pap test is abnormal or if the cervix looks abnormal. How is this treated? Treatment varies based on the severity of the condition. Treatment may include:  Cryotherapy. During cryotherapy, the abnormal cells are frozen with a steel-tip instrument.  Loop electrosurgical excision procedure (LEEP). This procedure removes abnormal tissue from the cervix.  Surgery to remove abnormal tissue. This is usually done in more advanced cases. Surgical options include: ? A cone biopsy. This is a procedure in which the cervical canal and a portion of the center of the cervix are removed. ? Hysterectomy. This is a surgery in which the uterus and cervix are removed. Follow these instructions at home:  Take over-the-counter and prescription medicines only as told by your health care provider.  Do not use tampons, have sex, or douche until your health care provider says it is okay.  Keep follow-up visits as told by your health care provider. This is important. Women who have been treated for cervical dysplasia should have regular pelvic exams and Pap tests as told by their health care provider. How is this prevented?  Practice safe sex to help prevent sexually transmitted infections (STI) that may cause this condition.  Have regular Pap tests. Talk with your health care provider about how often you need these tests. Pap tests will help identify cell changes that can lead to cancer. Contact a health care provider if:  You develop genital warts. Get help right away if:  You have a fever.  You have abnormal vaginal discharge.  Your menstrual period is heavier than normal.  You develop bright red bleeding.   This may include blood clots.  You have pain or cramps that get worse, and medicine does not help to relieve your pain.  You feel light-headed and you are unusually weak.  You have fainting spells.  You have pain in the abdomen. Summary  Cervical dysplasia is  a condition in which a woman's cervix cells have abnormal changes.  If left untreated, this condition may become more severe and may progress to cervical cancer.  Early detection, treatment, and follow-up care are very important in managing this condition.  Have regular pelvic exams and Pap tests. Talk with your health care provider about how often you need these tests. Pap tests will help identify cell changes that can lead to cancer. This information is not intended to replace advice given to you by your health care provider. Make sure you discuss any questions you have with your health care provider. Document Released: 05/09/2005 Document Revised: 02/28/2018 Document Reviewed: 05/12/2016 Elsevier Patient Education  2020 [employer] Colposcopy, Care After This sheet gives you information about how to care for yourself after your procedure. Your health care provider may also give you more specific instructions. If you have problems or questions, contact your health care provider. What can I expect after the procedure? If you had a colposcopy without a biopsy, you can expect to feel fine right away, but you may have some spotting for a few days. You can go back to your normal activities. If you had a colposcopy with a biopsy, it is common to have:  Soreness and pain. This may last for a few days.  Light-headedness.  Mild vaginal bleeding or dark-colored, grainy discharge. This may last for a few days. The discharge may be due to a solution that was used during the procedure. You may need to wear a sanitary pad during this time.  Spotting for at least 48 hours after the procedure. Follow these instructions at home:   Take over-the-counter and prescription medicines only as told by your health care provider. Talk with your health care provider about what type of over-the-counter pain medicine and prescription medicine you can start taking again. It is especially important to talk with your  health care provider if you take blood-thinning medicine.  Do not drive or use heavy machinery while taking prescription pain medicine.  For at least 3 days after your procedure, or as long as told by your health care provider, avoid: ? Douching. ? Using tampons. ? Having sexual intercourse.  Continue to use birth control (contraception).  Limit your physical activity for the first day after the procedure as told by your health care provider. Ask your health care provider what activities are safe for you.  It is up to you to get the results of your procedure. Ask your health care provider, or the department performing the procedure, when your results will be ready.  Keep all follow-up visits as told by your health care provider. This is important. Contact a health care provider if:  You develop a skin rash. Get help right away if:  You are bleeding heavily from your vagina or you are passing blood clots. This includes using more than one sanitary pad per hour for 2 hours in a row.  You have a fever or chills.  You have pelvic pain.  You have abnormal, yellow-colored, or bad-smelling vaginal discharge. This could be a sign of infection.  You have severe pain or cramps in your lower abdomen that do not get better with medicine.  You feel light-headed or dizzy, or you faint. Summary  If you had a colposcopy without a biopsy, you can expect to feel fine right away, but you may have some spotting for a few days. You can go back to your normal activities.  If you had a colposcopy with a biopsy, you may notice mild pain and spotting for 48 hours after the procedure.  Avoid douching, using tampons, and having sexual intercourse for 3 days after the procedure or as long as told by your health care provider.  Contact your health care provider if you have bleeding, severe pain, or signs of infection. This information is not intended to replace advice given to you by your health care  provider. Make sure you discuss any questions you have with your health care provider. Document Released: 02/27/2013 Document Revised: 04/21/2017 Document Reviewed: 12/25/2015 Elsevier Patient Education  2020 [employer].

## 2019-03-22 LAB — SURGICAL PATHOLOGY

## 2019-03-26 ENCOUNTER — Telehealth: Payer: Self-pay

## 2019-03-26 NOTE — Telephone Encounter (Signed)
-----   Message from Woodroe Mode, MD sent at 03/25/2019 11:06 AM EST ----- LSIL biopsy, recommend cotesting in 12 months

## 2019-03-26 NOTE — Telephone Encounter (Signed)
  Telephone is not accepting calls at this time. Unable to leave message. MyChart message sent

## 2019-03-27 ENCOUNTER — Other Ambulatory Visit: Payer: Self-pay | Admitting: *Deleted

## 2019-03-27 DIAGNOSIS — Z01419 Encounter for gynecological examination (general) (routine) without abnormal findings: Secondary | ICD-10-CM

## 2019-03-27 DIAGNOSIS — Z30013 Encounter for initial prescription of injectable contraceptive: Secondary | ICD-10-CM

## 2019-03-27 MED ORDER — MEDROXYPROGESTERONE ACETATE 150 MG/ML IM SUSP: 150.0000 mg | INTRAMUSCULAR | 2 refills | Status: DC

## 2019-03-27 NOTE — Progress Notes (Signed)
Depo Rx sent to pharmacy with refills. Pt had AEX in August and was only given 1 dose.

## 2019-03-28 ENCOUNTER — Other Ambulatory Visit: Payer: Self-pay

## 2019-03-28 ENCOUNTER — Ambulatory Visit (INDEPENDENT_AMBULATORY_CARE_PROVIDER_SITE_OTHER): Payer: Medicaid Other | Admitting: *Deleted

## 2019-03-28 ENCOUNTER — Ambulatory Visit: Payer: Medicaid Other

## 2019-03-28 DIAGNOSIS — Z3042 Encounter for surveillance of injectable contraceptive: Secondary | ICD-10-CM | POA: Diagnosis not present

## 2019-03-28 MED ORDER — MEDROXYPROGESTERONE ACETATE 150 MG/ML IM SUSP
150.0000 mg | Freq: Once | INTRAMUSCULAR | Status: AC
  Administered 2019-03-28: 09:00:00 150 mg via INTRAMUSCULAR

## 2019-03-28 NOTE — Progress Notes (Signed)
Pt is in office for Depo injection. Injection given, pt tolerated well.  Pt advised to RTO 1/21-06/27/2019 for next injection.  Pt has no other concerns today.    Administrations This Visit    medroxyPROGESTERone (DEPO-PROVERA) injection 150 mg    Admin Date 03/28/2019 Action Given Dose 150 mg Route Intramuscular Administered By Valene Bors, CMA

## 2019-03-28 NOTE — Progress Notes (Signed)
Agree with A & P. 

## 2019-04-19 DIAGNOSIS — L42 Pityriasis rosea: Secondary | ICD-10-CM | POA: Diagnosis not present

## 2019-05-23 DIAGNOSIS — F332 Major depressive disorder, recurrent severe without psychotic features: Secondary | ICD-10-CM | POA: Diagnosis not present

## 2019-05-23 DIAGNOSIS — F411 Generalized anxiety disorder: Secondary | ICD-10-CM | POA: Diagnosis not present

## 2019-06-04 ENCOUNTER — Telehealth: Payer: Self-pay

## 2019-06-04 NOTE — Telephone Encounter (Signed)
Pt called and reports that she had a "sexual encounter" with her boyfriend on Saturday and ever since then she has been experiencing worsening pelvic pain. Pt denies any bleeding or fever. Pt reports the pain does not go away with OTC medication. I advised pt we will schedule her for office visit with provider, pt voices understanding.

## 2019-06-05 ENCOUNTER — Ambulatory Visit (INDEPENDENT_AMBULATORY_CARE_PROVIDER_SITE_OTHER): Payer: Medicaid Other | Admitting: Obstetrics

## 2019-06-05 ENCOUNTER — Other Ambulatory Visit: Payer: Self-pay

## 2019-06-05 ENCOUNTER — Other Ambulatory Visit (HOSPITAL_COMMUNITY)
Admission: RE | Admit: 2019-06-05 | Discharge: 2019-06-05 | Disposition: A | Discharge status: Home / Self Care | Payer: Medicaid Other | Source: Ambulatory Visit | Attending: Obstetrics | Admitting: Obstetrics

## 2019-06-05 ENCOUNTER — Encounter: Payer: Self-pay | Admitting: Obstetrics

## 2019-06-05 VITALS — BP 119/82 | HR 103 | Wt 138.0 lb

## 2019-06-05 DIAGNOSIS — N3001 Acute cystitis with hematuria: Secondary | ICD-10-CM | POA: Diagnosis not present

## 2019-06-05 DIAGNOSIS — N898 Other specified noninflammatory disorders of vagina: Secondary | ICD-10-CM

## 2019-06-05 DIAGNOSIS — R300 Dysuria: Secondary | ICD-10-CM

## 2019-06-05 DIAGNOSIS — R102 Pelvic and perineal pain: Secondary | ICD-10-CM

## 2019-06-05 LAB — POCT URINALYSIS DIPSTICK
Bilirubin, UA: NEGATIVE
Glucose, UA: NEGATIVE
Ketones, UA: NEGATIVE
Nitrite, UA: POSITIVE
Protein, UA: NEGATIVE
Spec Grav, UA: 1.025 (ref 1.010–1.025)
Urobilinogen, UA: 0.2 E.U./dL
pH, UA: 6 (ref 5.0–8.0)

## 2019-06-05 MED ORDER — NITROFURANTOIN MONOHYD MACRO 100 MG PO CAPS: 100.0000 mg | ORAL_CAPSULE | Freq: Two times a day (BID) | ORAL | 2 refills | Status: DC

## 2019-06-05 MED ORDER — PHENAZOPYRIDINE HCL 200 MG PO TABS: 200.0000 mg | ORAL_TABLET | Freq: Three times a day (TID) | ORAL | 0 refills | Status: DC | PRN

## 2019-06-05 NOTE — Progress Notes (Signed)
Patient ID: Anne Baxter, female   DOB: 1991/03/04, 29 y.o.   MRN: [id number]  Chief Complaint  Patient presents with  . Gynecologic Exam    HPI Anne Baxter is a 29 y.o. female.  Complains of pelvic pain - LLQ - since last Saturday 06-01-19.  Also has burning and pain with urination. HPI  Past Medical History:  Diagnosis Date  . Anemia 2010   FE  . Depression   . Headache   . Infection 02/2017   CHLAMYDIA  . Pregnant   . Syncope   . Wrist fracture 2009 X2    Past Surgical History:  Procedure Laterality Date  . NO PAST SURGERIES      Family History  Problem Relation Age of Onset  . Kidney disease Mother        FAILURE  . Heart disease Mother        CHF  . Early death Mother 54  . Hypertension Mother   . Thrombophlebitis Mother   . Hyperlipidemia Maternal Grandmother   . Hypertension Maternal Grandmother     Social History Social History   Tobacco Use  . Smoking status: Never Smoker  . Smokeless tobacco: Never Used  Substance Use Topics  . Alcohol use: No    Alcohol/week: 0.0 standard drinks    Comment: RARELY  . Drug use: No    No Known Allergies  Current Outpatient Medications  Medication Sig Dispense Refill  . medroxyPROGESTERone (DEPO-PROVERA) 150 MG/ML injection Inject 1 mL (150 mg total) into the muscle every 3 (three) months. 1 mL 2  . butalbital-acetaminophen-caffeine (FIORICET) 50-325-40 MG tablet Take 2 tablets by mouth every 6 (six) hours as needed for headache or migraine. 30 tablet 2  . citalopram (CELEXA) 20 MG tablet Take 1 tablet (20 mg total) by mouth at bedtime. For depression (Patient not taking: Reported on 01/03/2019) 30 tablet 0  . nitrofurantoin, macrocrystal-monohydrate, (MACROBID) 100 MG capsule Take 1 capsule (100 mg total) by mouth 2 (two) times daily. 1 po BID x 7days 14 capsule 2  . phenazopyridine (PYRIDIUM) 200 MG tablet Take 1 tablet (200 mg total) by mouth 3 (three) times daily as needed for pain. 9 tablet 0  . propranolol  (INDERAL) 10 MG tablet Take 1 tablet (10 mg total) by mouth 2 (two) times daily. For anxiety (Patient not taking: Reported on 01/03/2019) 60 tablet 0   No current facility-administered medications for this visit.    Review of Systems Review of Systems Constitutional: negative for fatigue and weight loss Respiratory: negative for cough and wheezing Cardiovascular: negative for chest pain, fatigue and palpitations Gastrointestinal: negative for abdominal pain and change in bowel habits Genitourinary:positive for LLQ pelvic pain and burning and pain with urination Integument/breast: negative for nipple discharge Musculoskeletal:negative for myalgias Neurological: negative for gait problems and tremors Behavioral/Psych: negative for abusive relationship, depression Endocrine: negative for temperature intolerance      Blood pressure 119/82, pulse (!) 103, weight 138 lb (62.6 kg), unknown if currently breastfeeding.  Physical Exam Physical Exam General:   alert  Skin:   no rash or abnormalities  Lungs:   clear to auscultation bilaterally  Heart:   regular rate and rhythm, S1, S2 normal, no murmur, click, rub or gallop  Breasts:   normal without suspicious masses, skin or nipple changes or axillary nodes  Abdomen:  normal findings: no organomegaly, soft, non-tender and no hernia  Pelvis:  External genitalia: normal general appearance Urinary system: urethral meatus normal and bladder  without fullness, nontender Vaginal: normal without tenderness, induration or masses Cervix: normal appearance Adnexa: mild left adnexal tenderness, no fullness Uterus: anteverted and non-tender, normal size    50% of 15 min visit spent on counseling and coordination of care.   Data Reviewed Labs                                                                 Urinalysis: Results for CHARRIE, MCCONNON (MRN [id number]) as of 06/05/2019 15:47  Ref. Range 06/05/2019 15:38  Bilirubin, UA Unknown negative   Clarity, UA Unknown cloudy  Color, UA Unknown yellow  Glucose Latest Ref Range: Negative  Negative  Ketones, UA Unknown negative  Leukocytes,UA Latest Ref Range: Negative  Moderate (2+) (A)  Nitrite, UA Unknown positive  pH, UA Latest Ref Range: 5.0 - 8.0  6.0  Protein,UA Latest Ref Range: Negative  Negative  Specific Gravity, UA Latest Ref Range: 1.010 - 1.025  1.025  Urobilinogen, UA Latest Ref Range: 0.2 or 1.0 E.U./dL 0.2  RBC, UA Unknown moderate   Assessment     1. Pelvic pain Rx: - US PELVIC COMPLETE WITH TRANSVAGINAL; Future - POCT Urinalysis Dipstick  2. Vaginal discharge Rx: - Cervicovaginal ancillary only( [place])  3. Dysuria  4. Acute cystitis with hematuria Rx: - Urine culture - phenazopyridine (PYRIDIUM) 200 MG tablet; Take 1 tablet (200 mg total) by mouth 3 (three) times daily as needed for pain.  Dispense: 9 tablet; Refill: 0 - nitrofurantoin, macrocrystal-monohydrate, (MACROBID) 100 MG capsule; Take 1 capsule (100 mg total) by mouth 2 (two) times daily. 1 po BID x 7days  Dispense: 14 capsule; Refill: 2 - POCT Urinalysis Dipstick    Plan    Follow up prn  Orders Placed This Encounter  Procedures  . Urine culture  . US PELVIC COMPLETE WITH TRANSVAGINAL    Standing Status:   Future    Standing Expiration Date:   08/02/2020    Order Specific Question:   Reason for Exam (SYMPTOM  OR DIAGNOSIS REQUIRED)    Answer:   Pelvic pain    Order Specific Question:   Preferred imaging location?    Answer:   [facility]  . POCT Urinalysis Dipstick   Meds ordered this encounter  Medications  . phenazopyridine (PYRIDIUM) 200 MG tablet    Sig: Take 1 tablet (200 mg total) by mouth 3 (three) times daily as needed for pain.    Dispense:  9 tablet    Refill:  0  . nitrofurantoin, macrocrystal-monohydrate, (MACROBID) 100 MG capsule    Sig: Take 1 capsule (100 mg total) by mouth 2 (two) times daily. 1 po BID x 7days    Dispense:  14  capsule    Refill:  2      Shelly Bombard, MD 06/05/2019 3:49 PM

## 2019-06-06 ENCOUNTER — Other Ambulatory Visit: Payer: Self-pay | Admitting: Obstetrics

## 2019-06-06 DIAGNOSIS — B9689 Other specified bacterial agents as the cause of diseases classified elsewhere: Secondary | ICD-10-CM

## 2019-06-06 DIAGNOSIS — B373 Candidiasis of vulva and vagina: Secondary | ICD-10-CM

## 2019-06-06 DIAGNOSIS — B3731 Acute candidiasis of vulva and vagina: Secondary | ICD-10-CM

## 2019-06-06 LAB — CERVICOVAGINAL ANCILLARY ONLY
Bacterial Vaginitis (gardnerella): POSITIVE — AB
Candida Glabrata: NEGATIVE
Candida Vaginitis: POSITIVE — AB
Chlamydia: NEGATIVE
Comment: NEGATIVE
Comment: NEGATIVE
Comment: NEGATIVE
Comment: NEGATIVE
Comment: NEGATIVE
Comment: NORMAL
Neisseria Gonorrhea: NEGATIVE
Trichomonas: NEGATIVE

## 2019-06-06 MED ORDER — FLUCONAZOLE 150 MG PO TABS: 150.0000 mg | ORAL_TABLET | Freq: Once | ORAL | 0 refills | Status: AC

## 2019-06-06 MED ORDER — SOLOSEC 2 G PO PACK: 1.0000 | PACK | Freq: Once | ORAL | 2 refills | Status: AC

## 2019-06-08 LAB — URINE CULTURE

## 2019-06-10 ENCOUNTER — Other Ambulatory Visit: Payer: Self-pay | Admitting: Obstetrics

## 2019-06-12 ENCOUNTER — Ambulatory Visit (HOSPITAL_COMMUNITY): Payer: Medicaid Other | Attending: Obstetrics

## 2019-06-18 DIAGNOSIS — Z30013 Encounter for initial prescription of injectable contraceptive: Secondary | ICD-10-CM

## 2019-06-18 DIAGNOSIS — Z01419 Encounter for gynecological examination (general) (routine) without abnormal findings: Secondary | ICD-10-CM

## 2019-06-19 MED ORDER — MEDROXYPROGESTERONE ACETATE 150 MG/ML IM SUSP: 150.0000 mg | INTRAMUSCULAR | 3 refills | Status: DC

## 2019-06-20 ENCOUNTER — Ambulatory Visit (INDEPENDENT_AMBULATORY_CARE_PROVIDER_SITE_OTHER): Payer: Medicaid Other

## 2019-06-20 ENCOUNTER — Ambulatory Visit (HOSPITAL_COMMUNITY)
Admission: RE | Admit: 2019-06-20 | Discharge: 2019-06-20 | Disposition: A | Discharge status: Home / Self Care | Payer: Medicaid Other | Source: Ambulatory Visit | Attending: Obstetrics | Admitting: Obstetrics

## 2019-06-20 ENCOUNTER — Other Ambulatory Visit: Payer: Self-pay

## 2019-06-20 DIAGNOSIS — R102 Pelvic and perineal pain: Secondary | ICD-10-CM | POA: Insufficient documentation

## 2019-06-20 DIAGNOSIS — Z3042 Encounter for surveillance of injectable contraceptive: Secondary | ICD-10-CM

## 2019-06-20 MED ORDER — MEDROXYPROGESTERONE ACETATE 150 MG/ML IM SUSP
150.0000 mg | INTRAMUSCULAR | Status: DC
  Administered 2019-06-20: 150 mg via INTRAMUSCULAR

## 2019-06-20 NOTE — Progress Notes (Signed)
I have reviewed this chart and agree with the RN/CMA assessment and management.    K. Meryl Hagar Sadiq, M.D. Attending [facility] (Faculty Practice)   

## 2019-06-20 NOTE — Progress Notes (Signed)
Pt is in the office for depo, administered in [place] and pt tolerated well. Next due Apr 15-29. .. Administrations This Visit    medroxyPROGESTERone (DEPO-PROVERA) injection 150 mg    Admin Date 06/20/2019 Action Given Dose 150 mg Route Intramuscular Administered By Katrina Stack, RN

## 2019-07-17 ENCOUNTER — Telehealth: Payer: Self-pay

## 2019-07-17 NOTE — Telephone Encounter (Signed)
Pt called and reports that she is still having migraines and would like a refill on Fiorcet. I advised pt that because she is not pregnant we cannot keep prescribing this type of medication for her and that she would need to have this managed by her PCP. Pt reports that she is having chronic migraines and would like a referral to a neurologist.

## 2019-07-18 ENCOUNTER — Other Ambulatory Visit: Payer: Self-pay | Admitting: Obstetrics

## 2019-07-18 DIAGNOSIS — G43001 Migraine without aura, not intractable, with status migrainosus: Secondary | ICD-10-CM

## 2019-07-18 NOTE — Telephone Encounter (Signed)
Referred to Neurology for migraines.

## 2019-07-22 DIAGNOSIS — F332 Major depressive disorder, recurrent severe without psychotic features: Secondary | ICD-10-CM | POA: Diagnosis not present

## 2019-07-22 DIAGNOSIS — F411 Generalized anxiety disorder: Secondary | ICD-10-CM | POA: Diagnosis not present

## 2019-07-25 ENCOUNTER — Encounter (HOSPITAL_COMMUNITY): Payer: Self-pay

## 2019-07-25 ENCOUNTER — Emergency Department (HOSPITAL_COMMUNITY)
Admission: EM | Admit: 2019-07-25 | Discharge: 2019-07-25 | Disposition: A | Discharge status: Home / Self Care | Payer: Medicaid Other | Attending: Emergency Medicine | Admitting: Emergency Medicine

## 2019-07-25 ENCOUNTER — Telehealth: Payer: Self-pay | Admitting: Neurology

## 2019-07-25 ENCOUNTER — Other Ambulatory Visit: Payer: Self-pay

## 2019-07-25 DIAGNOSIS — R519 Headache, unspecified: Secondary | ICD-10-CM | POA: Diagnosis present

## 2019-07-25 DIAGNOSIS — G43809 Other migraine, not intractable, without status migrainosus: Secondary | ICD-10-CM | POA: Diagnosis not present

## 2019-07-25 DIAGNOSIS — R000 Tachycardia, unspecified: Secondary | ICD-10-CM | POA: Diagnosis not present

## 2019-07-25 LAB — CBC WITH DIFFERENTIAL/PLATELET
Abs Immature Granulocytes: 0.01 10*3/uL (ref 0.00–0.07)
Basophils Absolute: 0 10*3/uL (ref 0.0–0.1)
Basophils Relative: 0 %
Eosinophils Absolute: 0 10*3/uL (ref 0.0–0.5)
Eosinophils Relative: 0 %
HCT: 35.6 % — ABNORMAL LOW (ref 36.0–46.0)
Hemoglobin: 11.5 g/dL — ABNORMAL LOW (ref 12.0–15.0)
Immature Granulocytes: 0 %
Lymphocytes Relative: 21 %
Lymphs Abs: 1.3 10*3/uL (ref 0.7–4.0)
MCH: 24.8 pg — ABNORMAL LOW (ref 26.0–34.0)
MCHC: 32.3 g/dL (ref 30.0–36.0)
MCV: 76.7 fL — ABNORMAL LOW (ref 80.0–100.0)
Monocytes Absolute: 0.9 10*3/uL (ref 0.1–1.0)
Monocytes Relative: 14 %
Neutro Abs: 4.1 10*3/uL (ref 1.7–7.7)
Neutrophils Relative %: 65 %
Platelets: 302 10*3/uL (ref 150–400)
RBC: 4.64 MIL/uL (ref 3.87–5.11)
RDW: 14.6 % (ref 11.5–15.5)
WBC: 6.3 10*3/uL (ref 4.0–10.5)
nRBC: 0 % (ref 0.0–0.2)

## 2019-07-25 LAB — BASIC METABOLIC PANEL
Anion gap: 9 (ref 5–15)
BUN: 8 mg/dL (ref 6–20)
CO2: 22 mmol/L (ref 22–32)
Calcium: 8.9 mg/dL (ref 8.9–10.3)
Chloride: 107 mmol/L (ref 98–111)
Creatinine, Ser: 0.88 mg/dL (ref 0.44–1.00)
GFR calc Af Amer: >60 mL/min (ref >60)
GFR calc non Af Amer: >60 mL/min (ref >60)
Glucose, Bld: 90 mg/dL (ref 70–99)
Potassium: 3.8 mmol/L (ref 3.5–5.1)
Sodium: 138 mmol/L (ref 135–145)

## 2019-07-25 LAB — I-STAT BETA HCG BLOOD, ED (MC, WL, AP ONLY): I-stat hCG, quantitative: <5 m[IU]/mL (ref <5)

## 2019-07-25 MED ORDER — DEXAMETHASONE 4 MG PO TABS
10.0000 mg | ORAL_TABLET | Freq: Once | ORAL | Status: AC
  Administered 2019-07-25: 10 mg via ORAL
  Filled 2019-07-25: qty 2

## 2019-07-25 MED ORDER — DROPERIDOL 2.5 MG/ML IJ SOLN
1.2500 mg | Freq: Once | INTRAMUSCULAR | Status: AC
  Administered 2019-07-25: 1.25 mg via INTRAVENOUS
  Filled 2019-07-25: qty 2

## 2019-07-25 MED ORDER — KETOROLAC TROMETHAMINE 15 MG/ML IJ SOLN
15.0000 mg | Freq: Once | INTRAMUSCULAR | Status: AC
  Administered 2019-07-25: 15 mg via INTRAVENOUS
  Filled 2019-07-25: qty 1

## 2019-07-25 MED ORDER — SODIUM CHLORIDE 0.9 % IV BOLUS
1000.0000 mL | Freq: Once | INTRAVENOUS | Status: AC
  Administered 2019-07-25: 1000 mL via INTRAVENOUS

## 2019-07-25 MED ORDER — METOCLOPRAMIDE HCL 5 MG/ML IJ SOLN
10.0000 mg | Freq: Once | INTRAMUSCULAR | Status: AC
  Administered 2019-07-25: 10 mg via INTRAVENOUS
  Filled 2019-07-25: qty 2

## 2019-07-25 NOTE — ED Notes (Signed)
Pt verbalizes understanding of DC instructions. Pt belongings returned and is ambulatory out of ED.  

## 2019-07-25 NOTE — ED Triage Notes (Signed)
Pt presents with c/o migraine for 3 days. Pt reports she has a hx of migraines but [facility] is unable to get her in until April. Pt reports light sensitivity and nausea.

## 2019-07-25 NOTE — Telephone Encounter (Signed)
I returned the call to the patient. She was only seen once on 02/04/15. The work-up was never completed and she did not return to our office. Unfortunately, it is not safe for [place] to address her acute migraine over the phone. She will need to speak with her PCP until she is re-established here. Says the migraine is severe enough that she has decided to go the ED for treatment.

## 2019-07-25 NOTE — ED Provider Notes (Signed)
[facility] DEPT Provider Note   CSN: [id number] Arrival date & time: 07/25/19  1540     History Chief Complaint  Patient presents with  . Migraine    Anne Baxter is a 29 y.o. female.  The history is provided by the patient and medical records. No language interpreter was used.  Migraine   Anne Baxter is a 29 y.o. female who presents to the Emergency Department complaining of migraine.  She has a hx/o migraine headaches and developed recurrent migraines daily three weeks ago. Three days ago she developed a left sided HA with associated nausea and photophobia.  The pain is similar to her prior migraines but has persisted for three days and does not improve with rest, acetaminophen or Excedrin migraine.  Denies fevers, vomiting, chest pain, sob, numbness, weakness. She has not been eating due to the HA.  She was previously prescribed fioricet for her migraine but does not have that prescription any longer.  She has not previously been evaluated by Neurology but has an appointment set up for 4/1 with [facility].      Past Medical History:  Diagnosis Date  . Anemia 2010   FE  . Depression   . Headache   . Infection 02/2017   CHLAMYDIA  . Pregnant   . Syncope   . Wrist fracture 2009 X2    Patient Active Problem List   Diagnosis Date Noted  . 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 MDD (major depressive disorder) 11/30/2017  . 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 Depression 09/20/2016  . History of migraine headaches 03/16/2016    Past Surgical History:  Procedure Laterality Date  . NO PAST SURGERIES       OB History    Gravida  4   Para  2   Term  2   Preterm  0   AB  1   Living  2     SAB  0   TAB  1   Ectopic  0   Multiple  0   Live Births  2           Family History  Problem Relation Age of Onset  . Kidney disease Mother        FAILURE    . Heart disease Mother        CHF  . Early death Mother 48  . Hypertension Mother   . Thrombophlebitis Mother   . Hyperlipidemia Maternal Grandmother   . Hypertension Maternal Grandmother     Social History   Tobacco Use  . Smoking status: Never Smoker  . Smokeless tobacco: Never Used  Substance Use Topics  . Alcohol use: No    Alcohol/week: 0.0 standard drinks    Comment: RARELY  . Drug use: No    Home Medications Prior to Admission medications   Medication Sig Start Date End Date Taking? Authorizing Provider  butalbital-acetaminophen-caffeine (FIORICET) 50-325-40 MG tablet Take 2 tablets by mouth every 6 (six) hours as needed for headache or migraine. Patient not taking: Reported on 06/20/2019 01/14/19 01/14/20  Shelly Bombard, MD  citalopram (CELEXA) 20 MG tablet Take 1 tablet (20 mg total) by mouth at bedtime. For depression Patient not taking: Reported on 01/03/2019 12/04/17   Lindell Spar I, NP  medroxyPROGESTERone (DEPO-PROVERA) 150 MG/ML injection Inject 1 mL (150 mg total) into the muscle every 3 (three) months. Patient not taking: Reported on 06/20/2019  06/19/19   Adam Phenix, MD  nitrofurantoin, macrocrystal-monohydrate, (MACROBID) 100 MG capsule Take 1 capsule (100 mg total) by mouth 2 (two) times daily. 1 po BID x 7days Patient not taking: Reported on 06/20/2019 06/05/19   Brock Bad, MD  phenazopyridine (PYRIDIUM) 200 MG tablet Take 1 tablet (200 mg total) by mouth 3 (three) times daily as needed for pain. Patient not taking: Reported on 06/20/2019 06/05/19   Brock Bad, MD  propranolol (INDERAL) 10 MG tablet Take 1 tablet (10 mg total) by mouth 2 (two) times daily. For anxiety Patient not taking: Reported on 01/03/2019 12/04/17   Armandina Stammer I, NP    Allergies    Patient has no known allergies.  Review of Systems   Review of Systems  All other systems reviewed and are negative.   Physical Exam Updated Vital Signs BP 135/90   Pulse 85   Temp  99.3 F (37.4 C) (Oral)   Resp 17   Ht 5\' 6"  (1.676 m)   Wt 59 kg   SpO2 100%   BMI 20.98 kg/m   Physical Exam Vitals and nursing note reviewed.  Constitutional:      Appearance: She is well-developed.  HENT:     Head: Normocephalic and atraumatic.  Eyes:     Extraocular Movements: Extraocular movements intact.     Pupils: Pupils are equal, round, and reactive to light.  Cardiovascular:     Rate and Rhythm: Regular rhythm. Tachycardia present.     Heart sounds: No murmur.  Pulmonary:     Effort: Pulmonary effort is normal. No respiratory distress.     Breath sounds: Normal breath sounds.  Abdominal:     Palpations: Abdomen is soft.     Tenderness: There is no abdominal tenderness. There is no guarding or rebound.  Musculoskeletal:        General: No swelling or tenderness.     Cervical back: Neck supple.  Skin:    General: Skin is warm and dry.  Neurological:     Mental Status: She is alert and oriented to [place], place, and time.     Comments: 5/5 strength in all four extremities with sensation to light touch intact in all four extremities.   Psychiatric:        Behavior: Behavior normal.     ED Results / Procedures / Treatments   Labs (all labs ordered are listed, but only abnormal results are displayed) Labs Reviewed  CBC WITH DIFFERENTIAL/PLATELET - Abnormal; Notable for the following components:      Result Value   Hemoglobin 11.5 (*)    HCT 35.6 (*)    MCV 76.7 (*)    MCH 24.8 (*)    All other components within normal limits  BASIC METABOLIC PANEL  I-STAT BETA HCG BLOOD, ED ([facility], [facility], [facility] ONLY)    EKG EKG Interpretation  Date/Time:  Thursday July 25 2019 12:03:38 EST Ventricular Rate:  98 PR Interval:    QRS Duration: 86 QT Interval:  346 QTC Calculation: 442 R Axis:   40 Text Interpretation: Sinus rhythm no prior available for comparison Confirmed by 10-23-1982 [phone]) on 07/25/2019 12:19:24 PM   Radiology No results  found.  Procedures Procedures (including critical care time)  Medications Ordered in ED Medications  metoCLOPramide (REGLAN) injection 10 mg (10 mg Intravenous Given 07/25/19 1221)  sodium chloride 0.9 % bolus 1,000 mL (0 mLs Intravenous Stopped 07/25/19 1339)  ketorolac (TORADOL) 15 MG/ML injection 15 mg (15 mg  Intravenous Given 07/25/19 1338)  droperidol (INAPSINE) 2.5 MG/ML injection 1.25 mg (1.25 mg Intravenous Given 07/25/19 1341)  dexamethasone (DECADRON) tablet 10 mg (10 mg Oral Given 07/25/19 1435)    ED Course  I have reviewed the triage vital signs and the nursing notes.  Pertinent labs & imaging results that were available during my care of the patient were reviewed by me and considered in my medical decision making (see chart for details).    MDM Rules/Calculators/A&P                     Patient with history of migraine headache here for evaluation of three days of headache. She is non-toxic appearing on evaluation. Presentation is not consistent with meningitis, subarachnoid hemorrhage, space occupying lesion, dural sinus thrombosis. Following treatment in the emergency department her headache is resolved. Discussed with patient home care for migraine. She was treated with Decadron to prevent rebound headache. Discussed outpatient follow-up and return precautions.  Final Clinical Impression(s) / ED Diagnoses Final diagnoses:  Other migraine without status migrainosus, not intractable    Rx / DC Orders ED Discharge Orders    None       Tilden Fossa, MD 07/25/19 [phone]

## 2019-07-25 NOTE — Telephone Encounter (Signed)
Pt called wanting to know that even though it has been a while she is suffering from migraines and is wanting to know if a RN can advise her or have something called in for her because OTC medications are not helping. Pt is scheduled to come in on 08/22/19. Please advise.

## 2019-07-29 DIAGNOSIS — Z03818 Encounter for observation for suspected exposure to other biological agents ruled out: Secondary | ICD-10-CM | POA: Diagnosis not present

## 2019-07-29 DIAGNOSIS — R519 Headache, unspecified: Secondary | ICD-10-CM | POA: Diagnosis not present

## 2019-07-29 DIAGNOSIS — Z20828 Contact with and (suspected) exposure to other viral communicable diseases: Secondary | ICD-10-CM | POA: Diagnosis not present

## 2019-07-29 DIAGNOSIS — Z7189 Other specified counseling: Secondary | ICD-10-CM | POA: Diagnosis not present

## 2019-07-29 DIAGNOSIS — R509 Fever, unspecified: Secondary | ICD-10-CM | POA: Diagnosis not present

## 2019-07-29 DIAGNOSIS — R5383 Other fatigue: Secondary | ICD-10-CM | POA: Diagnosis not present

## 2019-07-29 DIAGNOSIS — Z1152 Encounter for screening for COVID-19: Secondary | ICD-10-CM | POA: Diagnosis not present

## 2019-07-29 DIAGNOSIS — R112 Nausea with vomiting, unspecified: Secondary | ICD-10-CM | POA: Diagnosis not present

## 2019-08-22 ENCOUNTER — Other Ambulatory Visit: Payer: Self-pay

## 2019-08-22 ENCOUNTER — Encounter: Payer: Self-pay | Admitting: Neurology

## 2019-08-22 ENCOUNTER — Ambulatory Visit: Payer: Medicaid Other | Admitting: Neurology

## 2019-08-22 VITALS — BP 124/77 | HR 104 | Temp 97.3°F | Ht 66.0 in | Wt 151.0 lb

## 2019-08-22 DIAGNOSIS — G43709 Chronic migraine without aura, not intractable, without status migrainosus: Secondary | ICD-10-CM

## 2019-08-22 DIAGNOSIS — IMO0002 Reserved for concepts with insufficient information to code with codable children: Secondary | ICD-10-CM

## 2019-08-22 DIAGNOSIS — G43909 Migraine, unspecified, not intractable, without status migrainosus: Secondary | ICD-10-CM | POA: Insufficient documentation

## 2019-08-22 MED ORDER — RIZATRIPTAN BENZOATE 10 MG PO TBDP: 10.0000 mg | ORAL_TABLET | ORAL | 6 refills | Status: DC | PRN

## 2019-08-22 MED ORDER — ONDANSETRON 4 MG PO TBDP: 4.0000 mg | ORAL_TABLET | Freq: Three times a day (TID) | ORAL | 6 refills | Status: DC | PRN

## 2019-08-22 MED ORDER — VENLAFAXINE HCL ER 37.5 MG PO CP24: 37.5000 mg | ORAL_CAPSULE | Freq: Every day | ORAL | 11 refills | Status: DC

## 2019-08-22 NOTE — Progress Notes (Signed)
PATIENT: Anne Baxter DOB: 05/28/1990  Chief Complaint  Patient presents with  . Migraine    She is here to re-establish care for migraines. In a typically month, she will have between 4-6 migraines.    Marland Kitchen PCP    Delma Officer, FNP  . Shona Simpson, MD - referring provider     HISTORICAL  Anne Baxter is a 29 year old female, seen in request by her OB/GYN Dr. Baltazar Baxter, and her primary care nurse practitioner Delma Officer to reestablish the care for her migraine headaches.  I have reviewed and summarized the referring note from the referring physician.  I saw her initially in 2018 for passing out spells, she reported intermittent passing out spells since high school, that contributed to her heavy menstruation, low blood pressure, she described increased passing out spells, headaches since 2014 aft excessive stress,  She reported 1 episode on January 18 2015, it was Sunday morning, she was relaxing watching TV, suddenly felt stomach ache, urge to have a bowel movement, she was able to use bathroom, at the same time, she felt heart racing fast, sweaty, nauseous, fainting, she was able to was for help, was able to lie down in her bed,  she was able to hear her family talking, but was not able to respond episode last about 20 to 30 minutes, gradually recovered, there was no witnessed seizure-like activity.  Have ordered MRI of the brain, EEG, suggested cardiac monitoring at that time but she lost to follow-up, she denies recurrent passing out spell.  Today her focus is migraine headaches, which started since high school, is usually started on the left side, spreading forward become left retro-orbital area severe pounding headache with associated light noise sensitivity, sometimes preceded by visual aura, she has migraine headaches about once or twice each week, Imitrex 50 mg was only helpful 50% of the time, she has been taking frequent over-the-counter Excedrin  Migraine which also only provide temporarily help  Trigger for her migraines are stress, bright light, exertion, sleep deprivation,  Laboratory evaluation in 2021, normal BMP, CBC showed mild anemia hemoglobin of 11.5, urine culture was positive for E. coli  REVIEW OF SYSTEMS: Full 14 system review of systems performed and notable only for as above All other review of systems were negative.  ALLERGIES: No Known Allergies  HOME MEDICATIONS: Current Outpatient Medications  Medication Sig Dispense Refill  . medroxyPROGESTERone (DEPO-PROVERA) 150 MG/ML injection Inject 1 mL (150 mg total) into the muscle every 3 (three) months. 1 mL 3  . mirtazapine (REMERON) 15 MG tablet Take 15 mg by mouth at bedtime.     No current facility-administered medications for this visit.    PAST MEDICAL HISTORY: Past Medical History:  Diagnosis Date  . Anemia 2010   FE  . Anxiety   . Depression   . Headache   . Infection 02/2017   CHLAMYDIA  . Insomnia   . Migraine   . Pregnant   . Syncope   . Wrist fracture 2009 X2    PAST SURGICAL HISTORY: Past Surgical History:  Procedure Laterality Date  . NO PAST SURGERIES      FAMILY HISTORY: Family History  Problem Relation Age of Onset  . Kidney disease Mother        FAILURE  . Heart disease Mother        CHF  . Early death Mother 13  . Hypertension Mother   . Thrombophlebitis Mother   .  Hyperlipidemia Maternal Grandmother   . Hypertension Maternal Grandmother     SOCIAL HISTORY: Social History   Socioeconomic History  . Marital status: Single    Spouse name: Not on file  . Number of children: 1  . Years of education: 82  . Highest education level: Not on file  Occupational History  . Occupation: CUSTOMER SERVICE  Tobacco Use  . Smoking status: Never Smoker  . Smokeless tobacco: Never Used  Substance and Sexual Activity  . Alcohol use: Yes    Alcohol/week: 0.0 standard drinks    Comment: RARELY  . Drug use: No  . Sexual  activity: Yes    Partners: Male    Birth control/protection: None  Other Topics Concern  . Not on file  Social History Narrative   Lives at home with boyfriend and daughter.   Right-handed.   No caffeine use.   Social Determinants of Health   Financial Resource Strain:   . Difficulty of Paying Living Expenses:   Food Insecurity:   . Worried About Programme researcher, broadcasting/film/video in the Last Year:   . Barista in the Last Year:   Transportation Needs:   . Freight forwarder (Medical):   Marland Kitchen Lack of Transportation (Non-Medical):   Physical Activity:   . Days of Exercise per Week:   . Minutes of Exercise per Session:   Stress:   . Feeling of Stress :   Social Connections:   . Frequency of Communication with Friends and Family:   . Frequency of Social Gatherings with Friends and Family:   . Attends Religious Services:   . Active Member of Clubs or Organizations:   . Attends Banker Meetings:   Marland Kitchen Marital Status:   Intimate Partner Violence:   . Fear of Current or Ex-Partner:   . Emotionally Abused:   Marland Kitchen Physically Abused:   . Sexually Abused:      PHYSICAL EXAM   Vitals:   08/22/19 0814  BP: 124/77  Pulse: (!) 104  Temp: (!) 97.3 F (36.3 C)  Weight: 151 lb (68.5 kg)  Height: 5\' 6"  (1.676 m)    Not recorded      Body mass index is 24.37 kg/m.  PHYSICAL EXAMNIATION:  Gen: NAD, conversant, well nourised, well groomed                     Cardiovascular: Regular rate rhythm, no peripheral edema, warm, nontender. Eyes: Conjunctivae clear without exudates or hemorrhage Neck: Supple, no carotid bruits. Pulmonary: Clear to auscultation bilaterally   NEUROLOGICAL EXAM:  MENTAL STATUS: Speech:    Speech is normal; fluent and spontaneous with normal comprehension.  Cognition:     Orientation to time, place and [place]     Normal recent and remote memory     Normal Attention span and concentration     Normal Language, naming, repeating,spontaneous  speech     Fund of knowledge   CRANIAL NERVES: CN II: Visual fields are full to confrontation. Pupils are round equal and briskly reactive to light. CN III, IV, VI: extraocular movement are normal. No ptosis. CN V: Facial sensation is intact to light touch CN VII: Face is symmetric with normal eye closure  CN VIII: Hearing is normal to causal conversation. CN IX, X: Phonation is normal. CN XI: Head turning and shoulder shrug are intact  MOTOR: There is no pronator drift of out-stretched arms. Muscle bulk and tone are normal. Muscle strength is normal.  REFLEXES: Reflexes are 2+ and symmetric at the biceps, triceps, knees, and ankles. Plantar responses are flexor.  SENSORY: Intact to light touch, pinprick and vibratory sensation are intact in fingers and toes.  COORDINATION: There is no trunk or limb dysmetria noted.  GAIT/STANCE: Posture is normal. Gait is steady with normal steps, base, arm swing, and turning. Heel and toe walking are normal. Tandem gait is normal.  Romberg is absent.   DIAGNOSTIC DATA (LABS, IMAGING, TESTING) - I reviewed patient records, labs, notes, testing and imaging myself where available.   ASSESSMENT AND PLAN  Lalana L Hollars is a 29 y.o. female   Chronic migraine headaches  Start preventive medication Effexor XR 37.5 mg daily  Advised her stop frequent over-the-counter medication to avoid medicine rebound headaches,  Suboptimal response to Imitrex, will change to Maxalt 10 mg as needed, may combine it with Zofran and Aleve  Return to clinic with nurse practitioner Sarah in 3 months  Levert Feinstein, M.D. Ph.D.  [facility] [address], [address] [place], [place] [address] Ph: [phone] Fax: [phone]  CC: Brock Bad, MD, Elane Fritz, FNP

## 2019-09-05 ENCOUNTER — Other Ambulatory Visit: Payer: Self-pay | Admitting: *Deleted

## 2019-09-05 MED ORDER — RIZATRIPTAN BENZOATE 10 MG PO TBDP: ORAL_TABLET | ORAL | 6 refills | Status: DC

## 2019-09-12 ENCOUNTER — Ambulatory Visit (INDEPENDENT_AMBULATORY_CARE_PROVIDER_SITE_OTHER): Payer: Medicaid Other

## 2019-09-12 ENCOUNTER — Other Ambulatory Visit: Payer: Self-pay

## 2019-09-12 DIAGNOSIS — Z3042 Encounter for surveillance of injectable contraceptive: Secondary | ICD-10-CM | POA: Diagnosis not present

## 2019-09-12 MED ORDER — MEDROXYPROGESTERONE ACETATE 150 MG/ML IM SUSP
150.0000 mg | Freq: Once | INTRAMUSCULAR | Status: AC
  Administered 2019-09-12: 150 mg via INTRAMUSCULAR

## 2019-09-12 NOTE — Progress Notes (Signed)
Anne Baxter is here for a depo injection.  Pt tolerated injection well in the RUOQ without complication. Pt advised to make next appointment around 7-10 & 7-24. -EH/RMA

## 2019-09-12 NOTE — Progress Notes (Signed)
Patient seen and assessed by nursing staff during this encounter. I have reviewed the chart and agree with the documentation and plan. I have also made any necessary editorial changes.  Catalina Antigua, MD 09/12/2019 4:41 PM

## 2019-09-17 DIAGNOSIS — F411 Generalized anxiety disorder: Secondary | ICD-10-CM | POA: Diagnosis not present

## 2019-09-17 DIAGNOSIS — F332 Major depressive disorder, recurrent severe without psychotic features: Secondary | ICD-10-CM | POA: Diagnosis not present

## 2019-09-23 DIAGNOSIS — R519 Headache, unspecified: Secondary | ICD-10-CM | POA: Diagnosis not present

## 2019-09-23 DIAGNOSIS — R110 Nausea: Secondary | ICD-10-CM | POA: Diagnosis not present

## 2019-09-23 DIAGNOSIS — Z20822 Contact with and (suspected) exposure to covid-19: Secondary | ICD-10-CM | POA: Diagnosis not present

## 2019-10-28 IMAGING — US US OB COMP LESS 14 WK
1 series · 15 of 28 positions shown · non-contrast
Comparison: Chest pelvic ultrasound performed 03/25/2016

CLINICAL DATA: Acute onset of vaginal bleeding.

EXAM:
OBSTETRIC <14 WK US AND TRANSVAGINAL OB US
TECHNIQUE: Both transabdominal and transvaginal ultrasound examinations were
performed for complete evaluation of the gestation as well as the
maternal uterus, adnexal regions, and pelvic cul-de-sac.
Transvaginal technique was performed to assess early pregnancy.

[Series 1: us ob comp less 14 wk · 60 acquisitions, 15 frames shown]
[im 1/60]
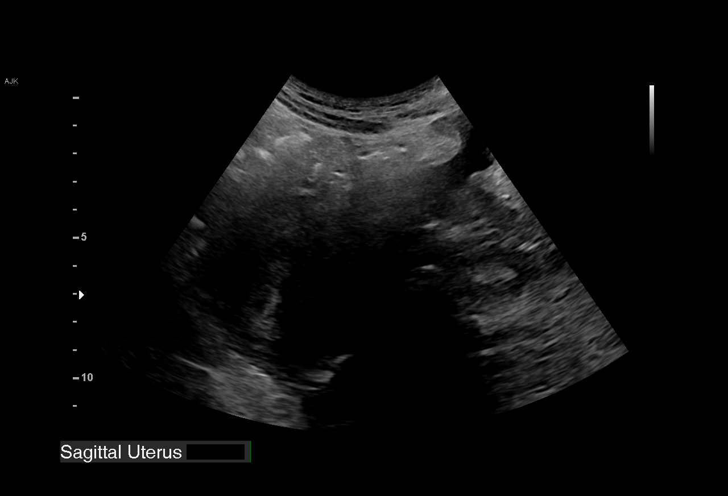
[im 5/60]
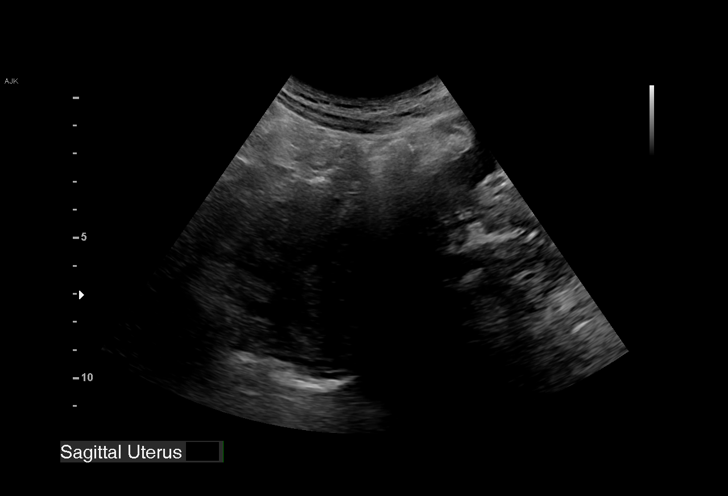
[im 9/60]
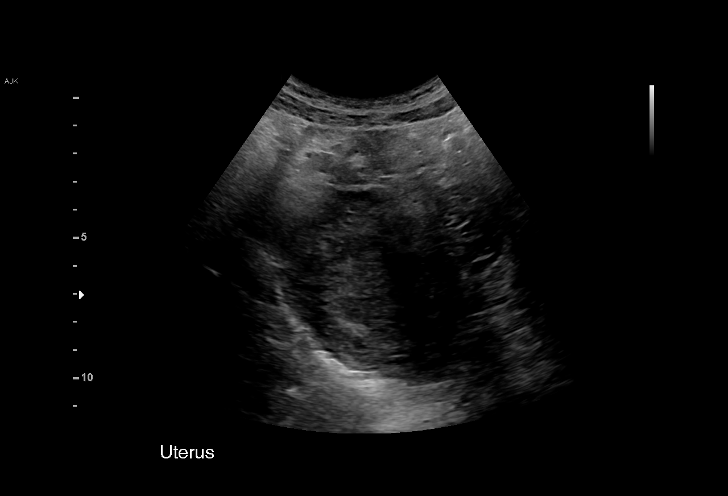
[im 14/60]
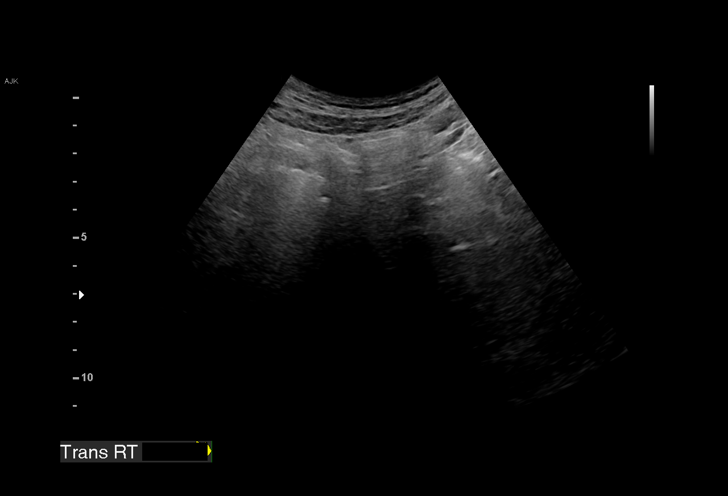
[im 18/60]
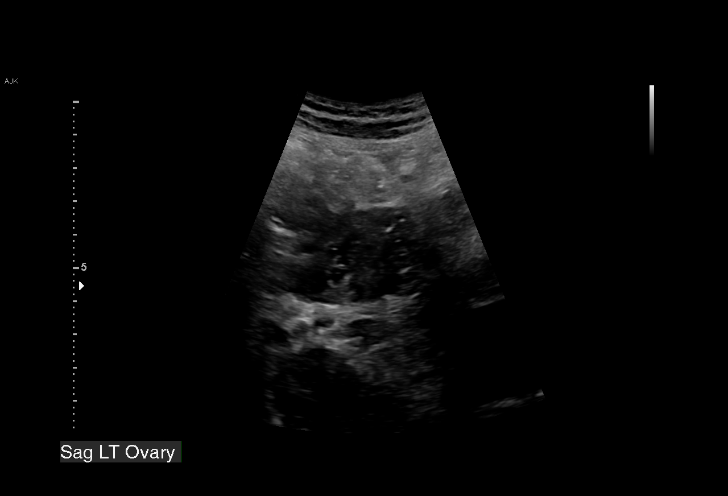
[im 22/60]
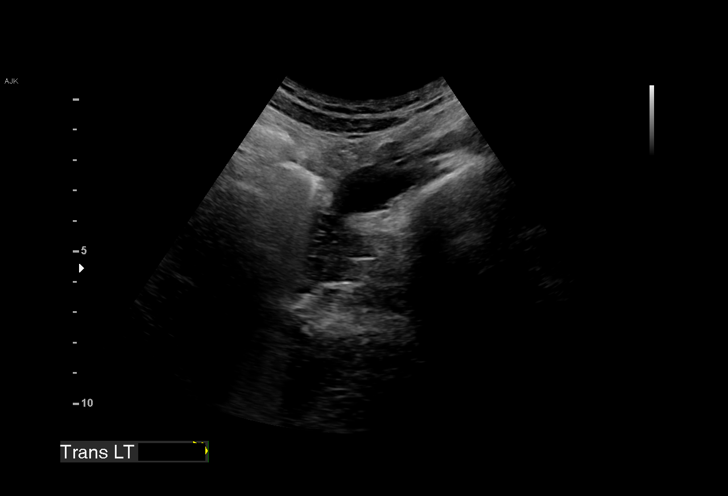
[im 27/60]
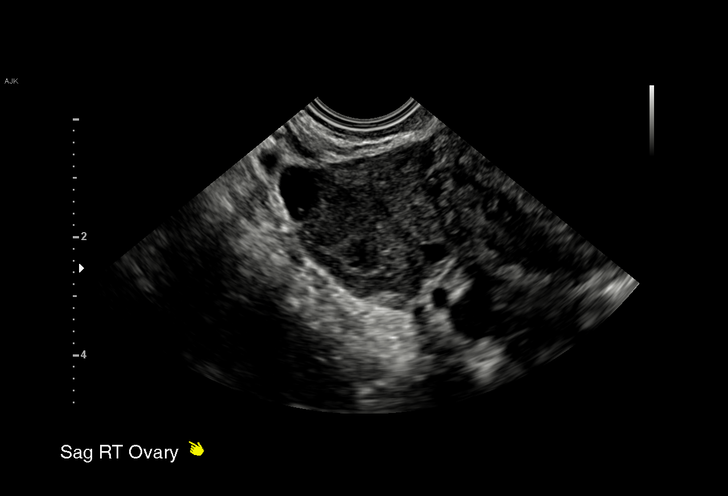
[im 31/60]
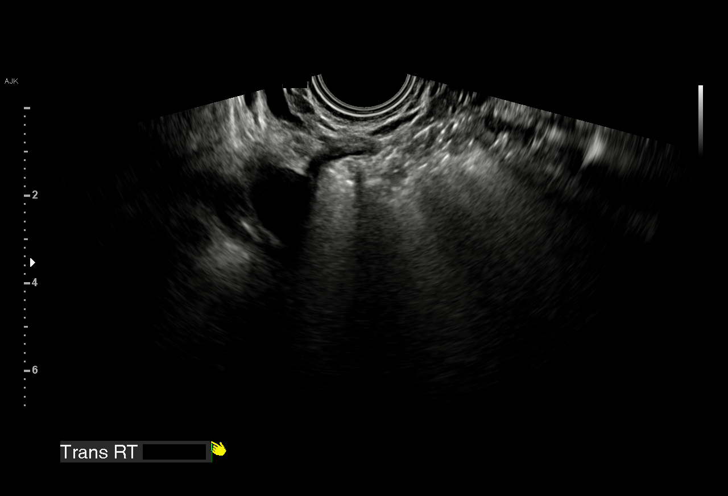
[im 33/60]
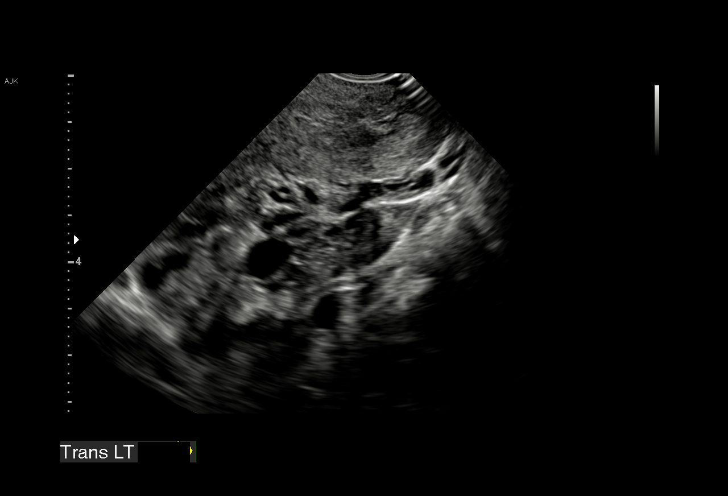
[im 38/60]
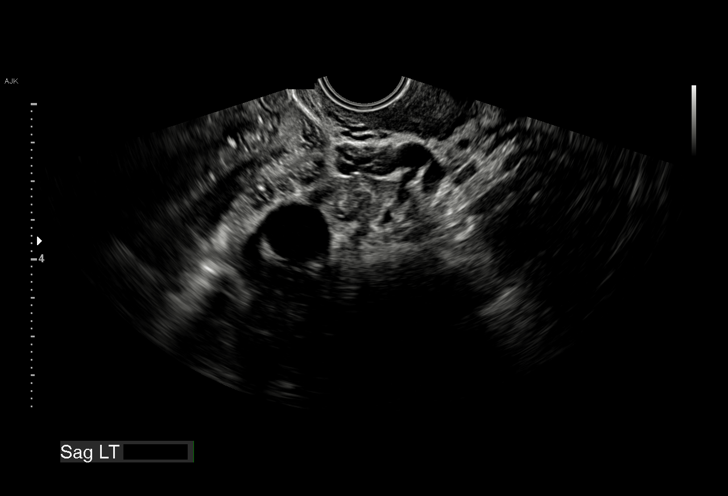
[im 42/60]
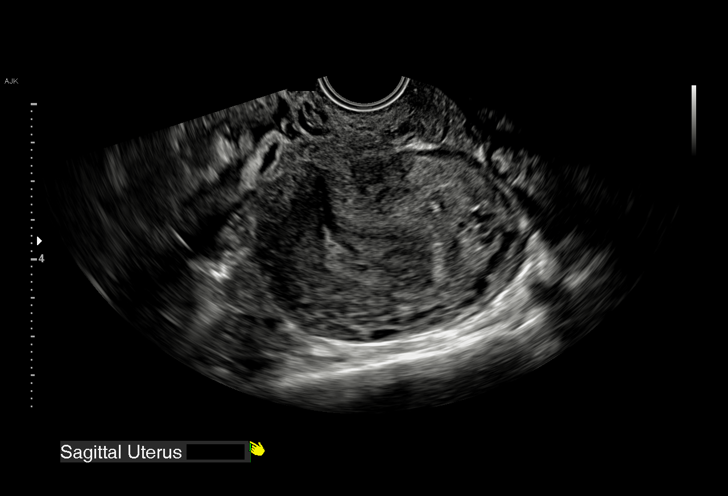
[im 46/60]
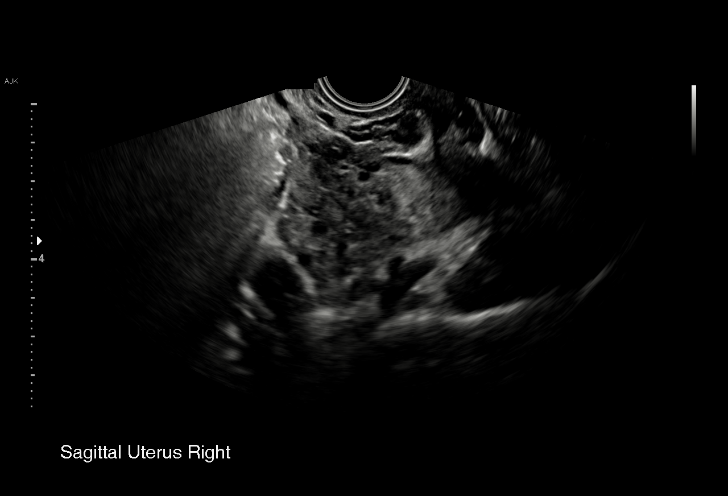
[im 51/60]
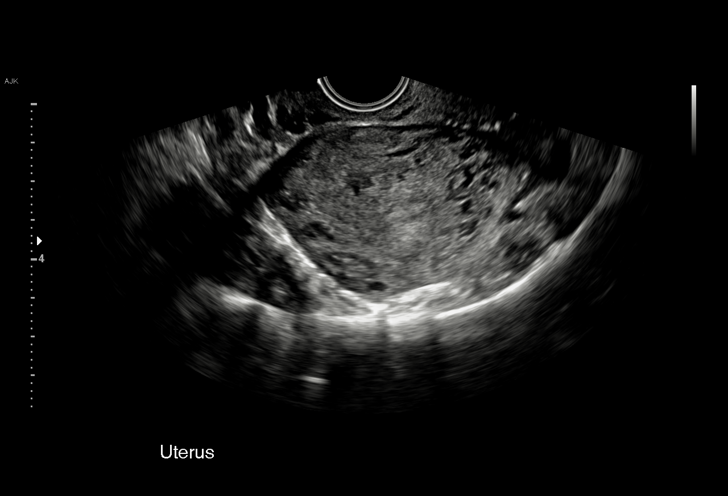
[im 55/60]
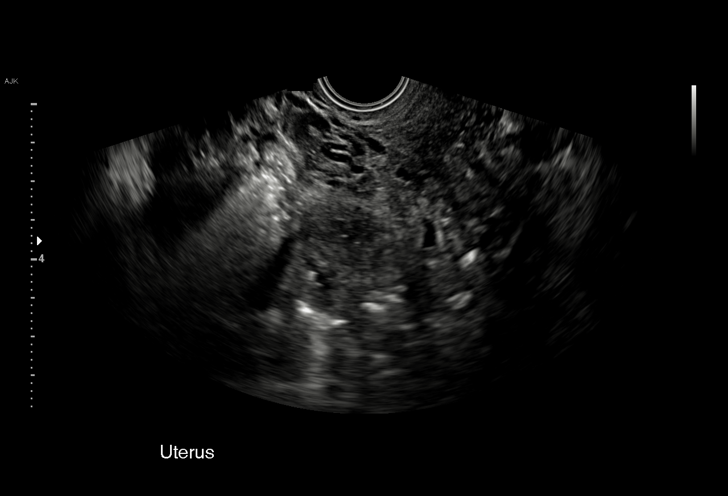
[im 60/60]
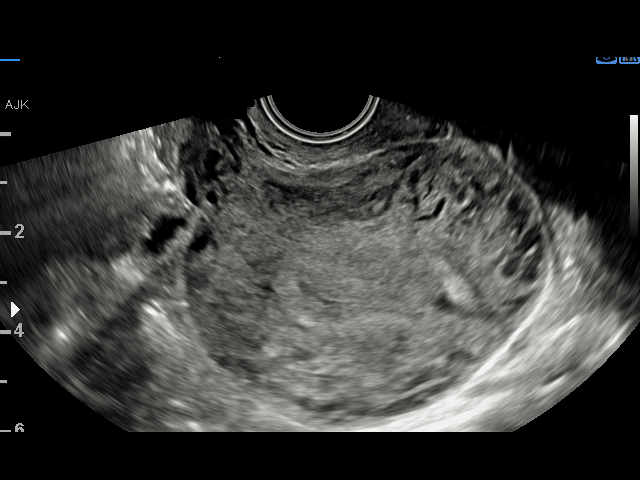

[15 of 28 positions shown; findings below may reference images not displayed]

FINDINGS: Intrauterine gestational sac: Single; visualized and normal in
shape.

Yolk sac:  Yes

Embryo:  No

MSD: 5.4  mm   5 w   2  d

Subchorionic hemorrhage:  None visualized.

Maternal uterus/adnexae: The uterus is unremarkable in appearance.
The ovaries are within normal limits. The right ovary measures 2.8 x
2.2 x 2.7 cm, while the left ovary measures 2.7 x 1.9 x 1.6 cm.
There is no evidence for ovarian torsion. No suspicious adnexal
masses are seen.

Trace free fluid is seen within the pelvic cul-de-sac.
IMPRESSION: Single intrauterine gestational sac noted, with a mean sac diameter
of 5 mm, corresponding to a gestational age of 5 weeks 2 days. This
does not match the gestational age by LMP, though it remains too
early to determine a new estimated date of delivery. A yolk sac is
visualized. No embryo is yet seen, within normal limits.

If the patient's quantitative beta HCG continues to rise, follow-up
pelvic ultrasound could be performed in 2 weeks for further
evaluation.

## 2019-11-04 IMAGING — US US OB TRANSVAGINAL
1 series · 15 of 28 positions shown · non-contrast
Comparison: None.

CLINICAL DATA: Pregnant, follow-up viability

EXAM:
TRANSVAGINAL OB ULTRASOUND
TECHNIQUE: Transvaginal ultrasound was performed for complete evaluation of the
gestation as well as the maternal uterus, adnexal regions, and
pelvic cul-de-sac.

[Series 1: us ob transvaginal · 15 of 54 slices shown]
[im 1/54]
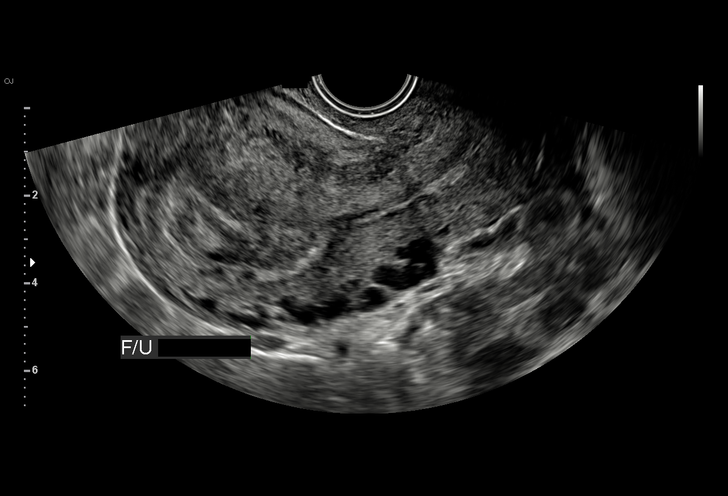
[im 4/54]
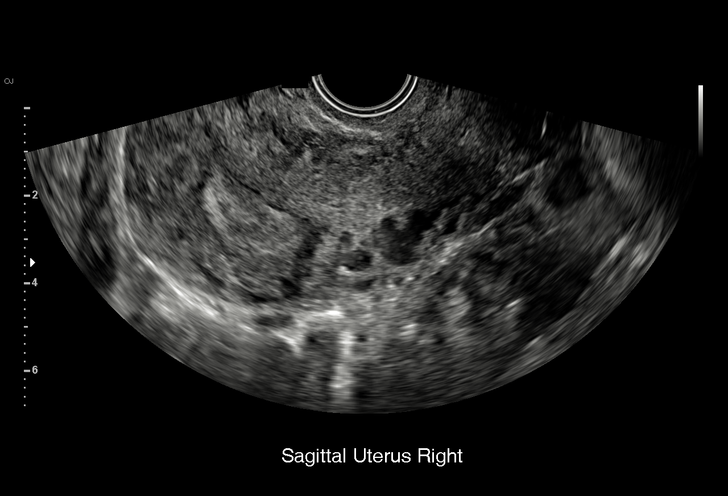
[im 8/54]
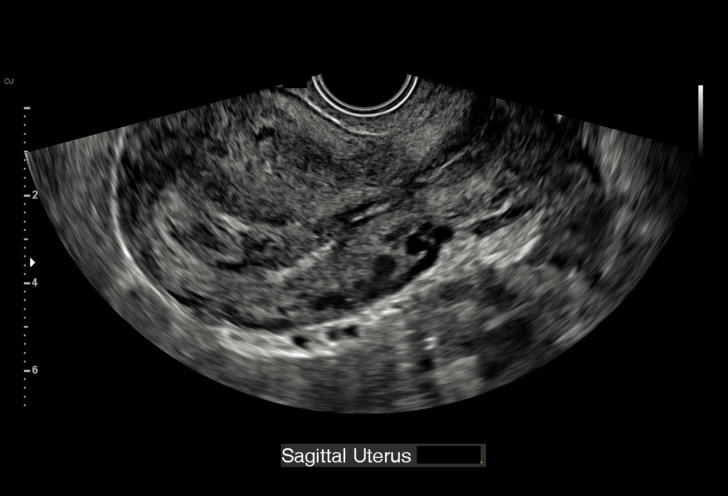
[im 12/54]
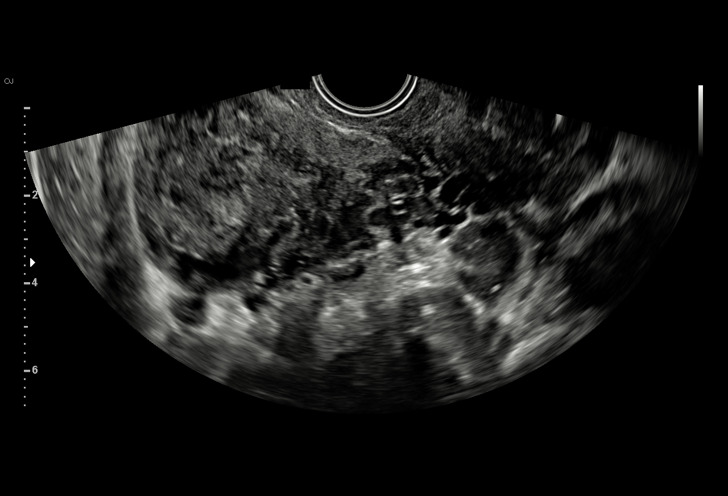
[im 16/54]
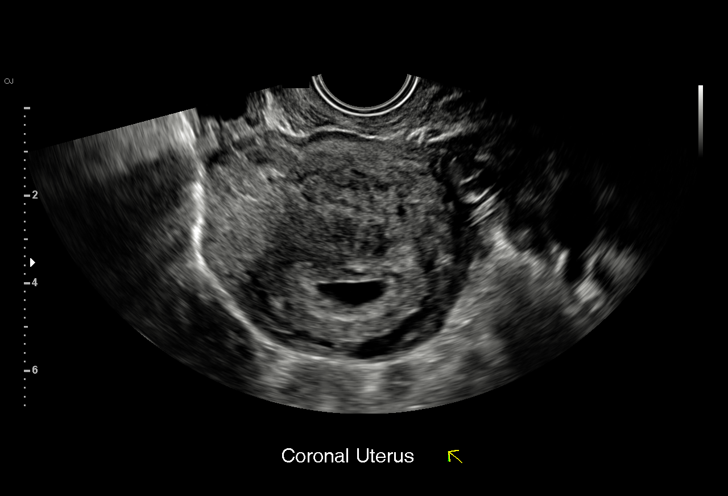
[im 20/54]
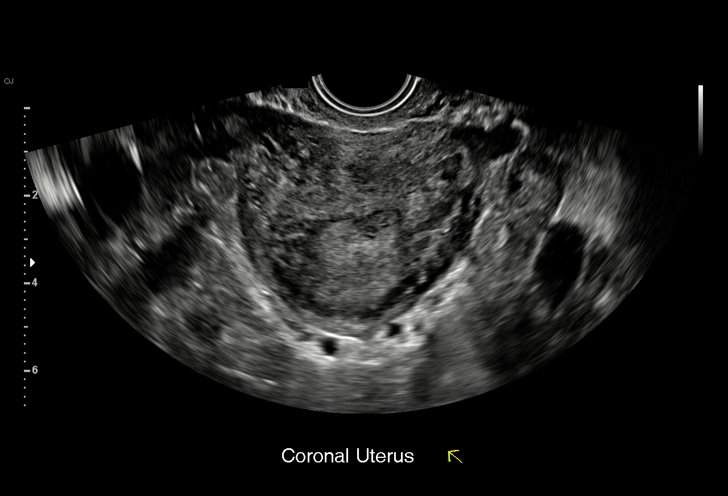
[im 24/54]
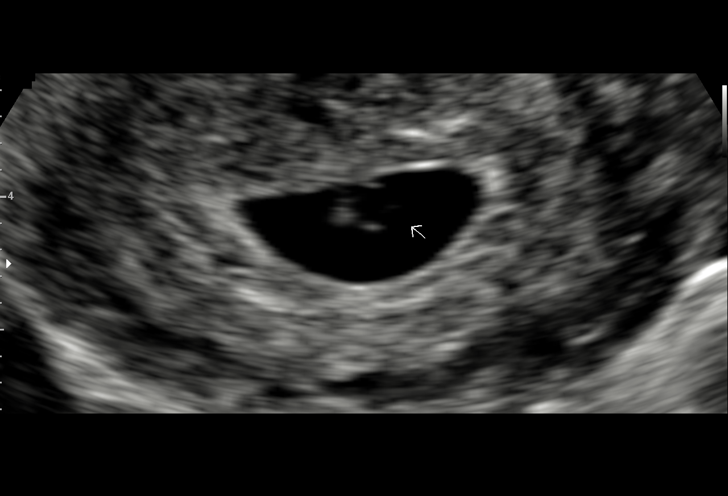
[im 28/54]
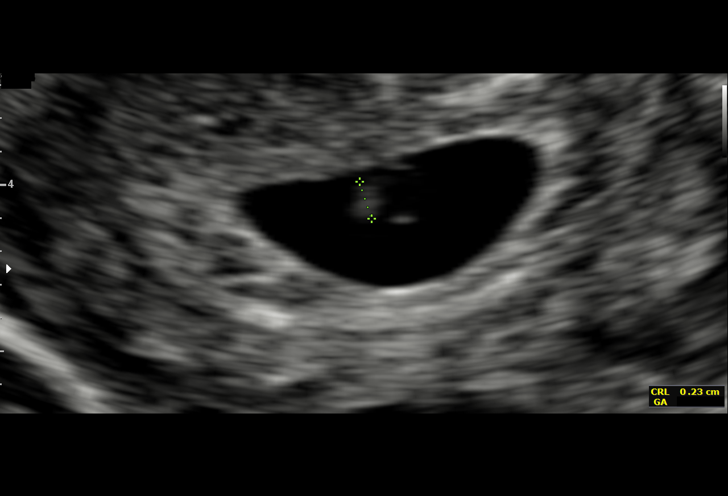
[im 30/54]
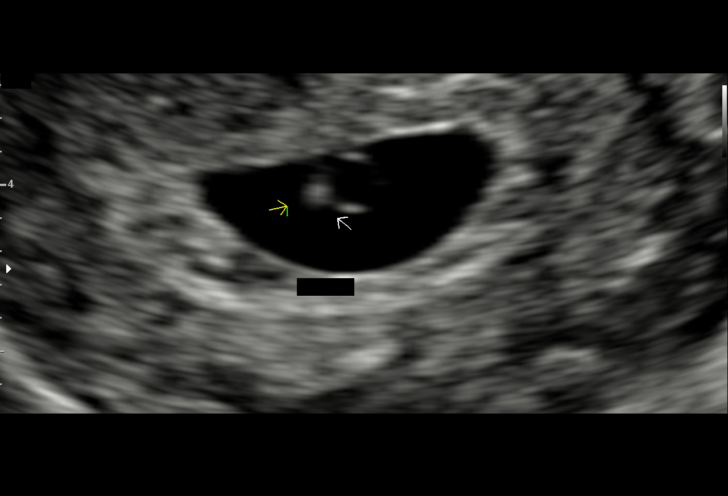
[im 34/54]
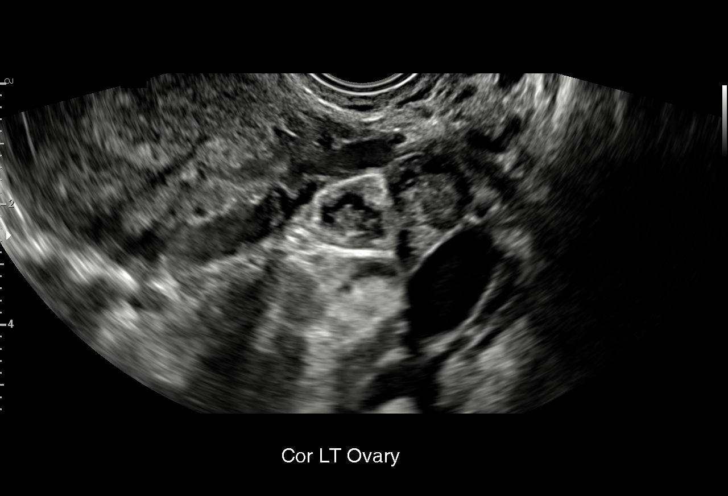
[im 38/54]
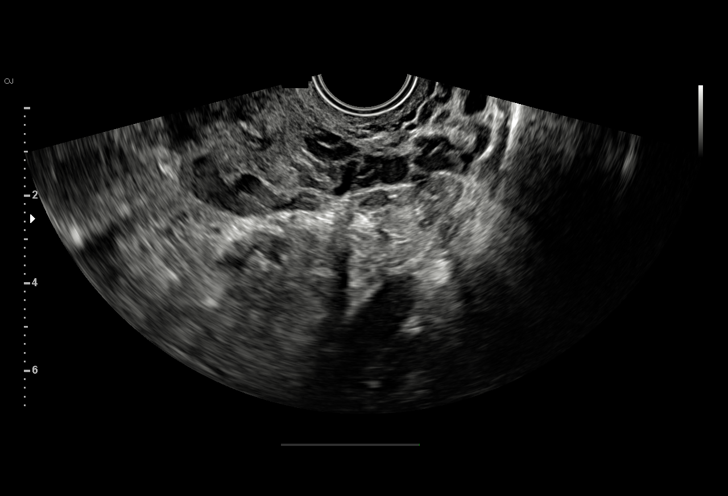
[im 42/54]
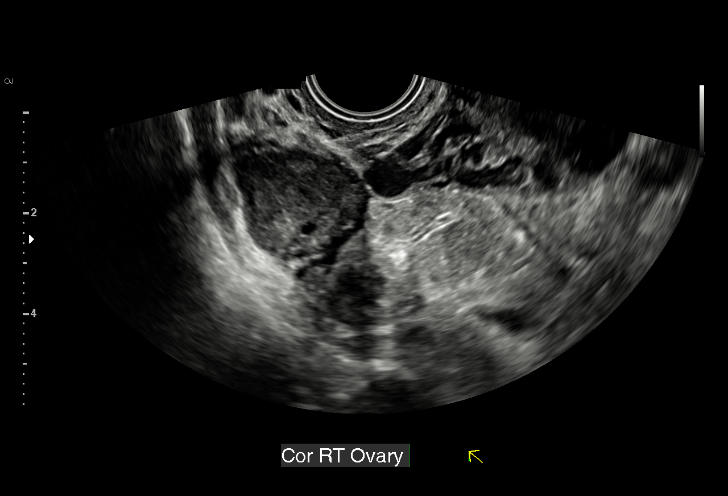
[im 46/54]
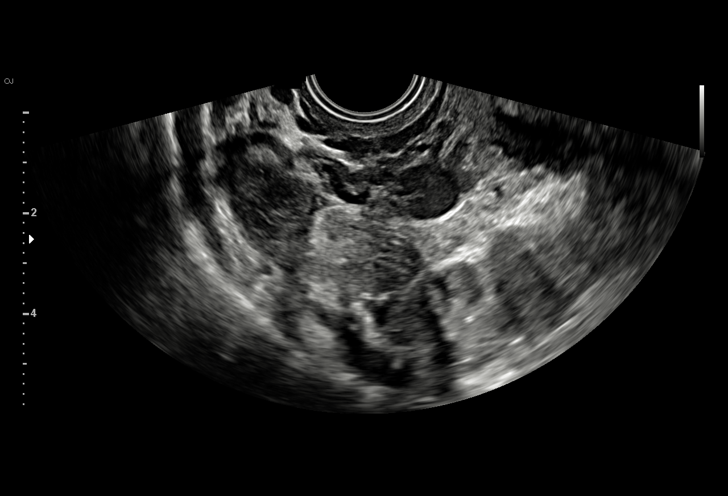
[im 50/54]
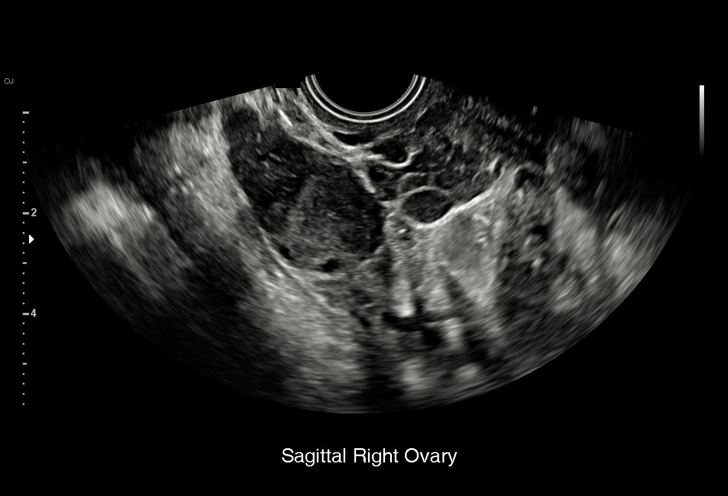
[im 54/54]
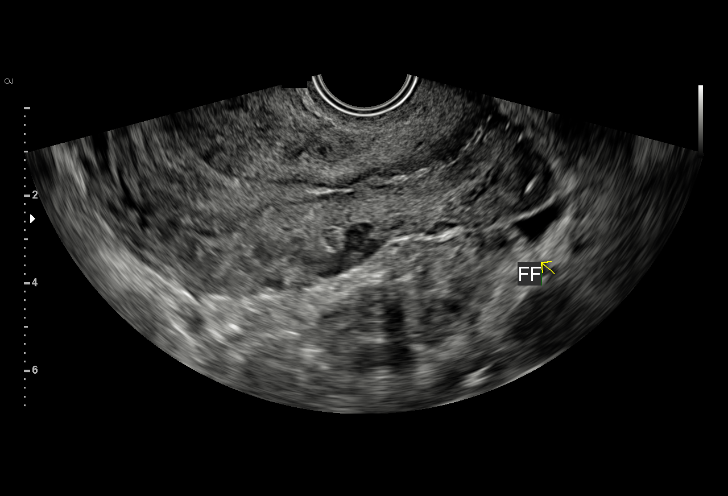

[15 of 28 positions shown; findings below may reference images not displayed]

FINDINGS: Intrauterine gestational sac: Single

Yolk sac:  Visualized.

Embryo:  Visualized.

Cardiac Activity: Visualized.

Heart Rate: 102 bpm

CRL:   2.2 mm   5 w 5 d                  US EDC: 02/28/2018

Subchorionic hemorrhage:  None visualized.

Maternal uterus/adnexae: Bilateral ovaries are within normal limits,
noting a right corpus luteal cyst.

No free fluid.
IMPRESSION: Single live intrauterine gestation, with estimated gestational age 5
weeks 5 days by crown-rump length, as above.

## 2019-11-13 DIAGNOSIS — F411 Generalized anxiety disorder: Secondary | ICD-10-CM | POA: Diagnosis not present

## 2019-11-13 DIAGNOSIS — F332 Major depressive disorder, recurrent severe without psychotic features: Secondary | ICD-10-CM | POA: Diagnosis not present

## 2019-11-21 ENCOUNTER — Ambulatory Visit: Payer: Medicaid Other | Admitting: Neurology

## 2019-11-21 ENCOUNTER — Encounter: Payer: Self-pay | Admitting: Neurology

## 2019-11-21 NOTE — Progress Notes (Deleted)
PATIENT: Anne Baxter DOB: Dec 10, 1990  REASON FOR VISIT: follow up HISTORY FROM: patient  HISTORY OF PRESENT ILLNESS: Today 11/21/19  HISTORY  Anne Baxter is a 29 year old female, seen in request by her OB/GYN Dr. Coral Ceo, and her primary care nurse practitioner Elane Fritz to reestablish the care for her migraine headaches.  I have reviewed and summarized the referring note from the referring physician.  I saw her initially in 2018 for passing out spells, she reported intermittent passing out spells since high school, that contributed to her heavy menstruation, low blood pressure, she described increased passing out spells, headaches since 2014 aft excessive stress,  She reported 1 episode on January 18 2015, it was Sunday morning, she was relaxing watching TV, suddenly felt stomach ache, urge to have a bowel movement, she was able to use bathroom, at the same time, she felt heart racing fast, sweaty, nauseous, fainting, she was able to was for help, was able to lie down in her bed,  she was able to hear her family talking, but was not able to respond episode last about 20 to 30 minutes, gradually recovered, there was no witnessed seizure-like activity.  Have ordered MRI of the brain, EEG, suggested cardiac monitoring at that time but she lost to follow-up, she denies recurrent passing out spell.  Today her focus is migraine headaches, which started since high school, is usually started on the left side, spreading forward become left retro-orbital area severe pounding headache with associated light noise sensitivity, sometimes preceded by visual aura, she has migraine headaches about once or twice each week, Imitrex 50 mg was only helpful 50% of the time, she has been taking frequent over-the-counter Excedrin Migraine which also only provide temporarily help  Trigger for her migraines are stress, bright light, exertion, sleep deprivation,  Laboratory evaluation in  2021, normal BMP, CBC showed mild anemia hemoglobin of 11.5, urine culture was positive for E. Coli  Update November 21, 2019 SS: When last seen started on Effexor XR 37.5 mg daily, switch from Imitrex to Maxalt  REVIEW OF SYSTEMS: Out of a complete 14 system review of symptoms, the patient complains only of the following symptoms, and all other reviewed systems are negative.  ALLERGIES: No Known Allergies  HOME MEDICATIONS: Outpatient Medications Prior to Visit  Medication Sig Dispense Refill  . medroxyPROGESTERone (DEPO-PROVERA) 150 MG/ML injection Inject 1 mL (150 mg total) into the muscle every 3 (three) months. 1 mL 3  . mirtazapine (REMERON) 15 MG tablet Take 15 mg by mouth at bedtime.    . ondansetron (ZOFRAN ODT) 4 MG disintegrating tablet Take 1 tablet (4 mg total) by mouth every 8 (eight) hours as needed. 20 tablet 6  . rizatriptan (MAXALT-MLT) 10 MG disintegrating tablet Take 1 tab at onset of migraine.  May repeat in 2 hrs, if needed.  Max dose: 2 tabs/day. This is a 30 day prescription. 12 tablet 6  . venlafaxine XR (EFFEXOR XR) 37.5 MG 24 hr capsule Take 1 capsule (37.5 mg total) by mouth daily with breakfast. 30 capsule 11   No facility-administered medications prior to visit.    PAST MEDICAL HISTORY: Past Medical History:  Diagnosis Date  . Anemia 2010   FE  . Anxiety   . Depression   . Headache   . Infection 02/2017   CHLAMYDIA  . Insomnia   . Migraine   . Pregnant   . Syncope   . Wrist fracture 2009 X2  PAST SURGICAL HISTORY: Past Surgical History:  Procedure Laterality Date  . NO PAST SURGERIES      FAMILY HISTORY: Family History  Problem Relation Age of Onset  . Kidney disease Mother        FAILURE  . Heart disease Mother        CHF  . Early death Mother 67  . Hypertension Mother   . Thrombophlebitis Mother   . Hyperlipidemia Maternal Grandmother   . Hypertension Maternal Grandmother     SOCIAL HISTORY: Social History   Socioeconomic  History  . Marital status: Single    Spouse name: Not on file  . Number of children: 1  . Years of education: 66  . Highest education level: Not on file  Occupational History  . Occupation: CUSTOMER SERVICE  Tobacco Use  . Smoking status: Never Smoker  . Smokeless tobacco: Never Used  Substance and Sexual Activity  . Alcohol use: Yes    Alcohol/week: 0.0 standard drinks    Comment: RARELY  . Drug use: No  . Sexual activity: Yes    Partners: Male    Birth control/protection: None  Other Topics Concern  . Not on file  Social History Narrative   Lives at home with boyfriend and daughter.   Right-handed.   No caffeine use.   Social Determinants of Health   Financial Resource Strain:   . Difficulty of Paying Living Expenses:   Food Insecurity:   . Worried About Programme researcher, broadcasting/film/video in the Last Year:   . Barista in the Last Year:   Transportation Needs:   . Freight forwarder (Medical):   Marland Kitchen Lack of Transportation (Non-Medical):   Physical Activity:   . Days of Exercise per Week:   . Minutes of Exercise per Session:   Stress:   . Feeling of Stress :   Social Connections:   . Frequency of Communication with Friends and Family:   . Frequency of Social Gatherings with Friends and Family:   . Attends Religious Services:   . Active Member of Clubs or Organizations:   . Attends Banker Meetings:   Marland Kitchen Marital Status:   Intimate Partner Violence:   . Fear of Current or Ex-Partner:   . Emotionally Abused:   Marland Kitchen Physically Abused:   . Sexually Abused:       PHYSICAL EXAM  There were no vitals filed for this visit. There is no height or weight on file to calculate BMI.  Generalized: Well developed, in no acute distress   Neurological examination  Mentation: Alert oriented to time, place, history taking. Follows all commands speech and language fluent Cranial nerve II-XII: Pupils were equal round reactive to light. Extraocular movements were full,  visual field were full on confrontational test. Facial sensation and strength were normal. Uvula tongue midline. Head turning and shoulder shrug  were normal and symmetric. Motor: The motor testing reveals 5 over 5 strength of all 4 extremities. Good symmetric motor tone is noted throughout.  Sensory: Sensory testing is intact to soft touch on all 4 extremities. No evidence of extinction is noted.  Coordination: Cerebellar testing reveals good finger-nose-finger and heel-to-shin bilaterally.  Gait and station: Gait is normal. Tandem gait is normal. Romberg is negative. No drift is seen.  Reflexes: Deep tendon reflexes are symmetric and normal bilaterally.   DIAGNOSTIC DATA (LABS, IMAGING, TESTING) - I reviewed patient records, labs, notes, testing and imaging myself where available.  Lab Results  Component  Value Date   WBC 6.3 07/25/2019   HGB 11.5 (L) 07/25/2019   HCT 35.6 (L) 07/25/2019   MCV 76.7 (L) 07/25/2019   PLT 302 07/25/2019      Component Value Date/Time   NA 138 07/25/2019 1205   K 3.8 07/25/2019 1205   CL 107 07/25/2019 1205   CO2 22 07/25/2019 1205   GLUCOSE 90 07/25/2019 1205   BUN 8 07/25/2019 1205   CREATININE 0.88 07/25/2019 1205   CALCIUM 8.9 07/25/2019 1205   PROT 6.8 12/01/2017 0628   ALBUMIN 3.9 12/01/2017 0628   AST 14 (L) 12/01/2017 0628   ALT 9 12/01/2017 0628   ALKPHOS 40 12/01/2017 0628   BILITOT 1.0 12/01/2017 0628   GFRNONAA >60 07/25/2019 1205   GFRAA >60 07/25/2019 1205   Lab Results  Component Value Date   CHOL 151 12/01/2017   HDL 72 12/01/2017   LDLCALC 74 12/01/2017   TRIG 23 12/01/2017   CHOLHDL 2.1 12/01/2017   Lab Results  Component Value Date   HGBA1C 5.5 12/01/2017   No results found for: [id number] Lab Results  Component Value Date   TSH 0.959 12/01/2017      ASSESSMENT AND PLAN 29 y.o. year old female  has a past medical history of Anemia (2010), Anxiety, Depression, Headache, Infection (02/2017), Insomnia, Migraine,  Pregnant, Syncope, and Wrist fracture (2009 X2). here with:  1.  Chronic migraine headaches   I spent 15 minutes with the patient. 50% of this time was spent   Margie Ege, [place], DNP 11/21/2019, 5:17 AM [facility] [address] [place], [place] [address] [phone]

## 2019-12-02 ENCOUNTER — Ambulatory Visit: Payer: Medicaid Other

## 2019-12-04 ENCOUNTER — Ambulatory Visit: Payer: Medicaid Other

## 2019-12-04 ENCOUNTER — Other Ambulatory Visit: Payer: Self-pay

## 2019-12-04 ENCOUNTER — Ambulatory Visit (INDEPENDENT_AMBULATORY_CARE_PROVIDER_SITE_OTHER): Payer: Medicaid Other

## 2019-12-04 VITALS — Wt 171.5 lb

## 2019-12-04 DIAGNOSIS — Z3042 Encounter for surveillance of injectable contraceptive: Secondary | ICD-10-CM

## 2019-12-04 MED ORDER — MEDROXYPROGESTERONE ACETATE 150 MG/ML IM SUSP
150.0000 mg | Freq: Once | INTRAMUSCULAR | Status: AC
  Administered 2019-12-04: 150 mg via INTRAMUSCULAR

## 2019-12-04 NOTE — Progress Notes (Signed)
Pt is here for depo injection. She is on time for injection. Injection given without difficulty. Next injection due 09/29-10/13.

## 2019-12-25 ENCOUNTER — Encounter: Payer: Self-pay | Admitting: Neurology

## 2019-12-25 ENCOUNTER — Ambulatory Visit: Payer: Medicaid Other | Admitting: Neurology

## 2019-12-25 NOTE — Progress Notes (Deleted)
PATIENT: Anne Baxter DOB: [birth year]  REASON FOR VISIT: follow up HISTORY FROM: patient  HISTORY OF PRESENT ILLNESS: Today 12/25/19  HISTORY Anne Baxter is a 29 year old female, seen in request by her OB/GYN Dr. Coral Ceo, and her primary care nurse practitioner Elane Fritz to reestablish the care for her migraine headaches.  I have reviewed and summarized the referring note from the referring physician.  I saw her initially in 2018 for passing out spells, she reported intermittent passing out spells since high school, that contributed to her heavy menstruation, low blood pressure, she described increased passing out spells, headaches since 2014 aft excessive stress,  She reported 1 episode on January 18 2015, it was Sunday morning, she was relaxing watching TV, suddenly felt stomach ache, urge to have a bowel movement, she was able to use bathroom, at the same time, she felt heart racing fast, sweaty, nauseous, fainting, she was able to was for help, was able to lie down in her bed,  she was able to hear her family talking, but was not able to respond episode last about 20 to 30 minutes, gradually recovered, there was no witnessed seizure-like activity.  Have ordered MRI of the brain, EEG, suggested cardiac monitoring at that time but she lost to follow-up, she denies recurrent passing out spell.  Today her focus is migraine headaches, which started since high school, is usually started on the left side, spreading forward become left retro-orbital area severe pounding headache with associated light noise sensitivity, sometimes preceded by visual aura, she has migraine headaches about once or twice each week, Imitrex 50 mg was only helpful 50% of the time, she has been taking frequent over-the-counter Excedrin Migraine which also only provide temporarily help  Trigger for her migraines are stress, bright light, exertion, sleep deprivation,  Laboratory evaluation in  2021, normal BMP, CBC showed mild anemia hemoglobin of 11.5, urine culture was positive for E. Coli  Update December 25, 2019 SS:   REVIEW OF SYSTEMS: Out of a complete 14 system review of symptoms, the patient complains only of the following symptoms, and all other reviewed systems are negative.  ALLERGIES: No Known Allergies  HOME MEDICATIONS: Outpatient Medications Prior to Visit  Medication Sig Dispense Refill  . medroxyPROGESTERone (DEPO-PROVERA) 150 MG/ML injection Inject 1 mL (150 mg total) into the muscle every 3 (three) months. 1 mL 3  . mirtazapine (REMERON) 15 MG tablet Take 15 mg by mouth at bedtime.    . ondansetron (ZOFRAN ODT) 4 MG disintegrating tablet Take 1 tablet (4 mg total) by mouth every 8 (eight) hours as needed. 20 tablet 6  . rizatriptan (MAXALT-MLT) 10 MG disintegrating tablet Take 1 tab at onset of migraine.  May repeat in 2 hrs, if needed.  Max dose: 2 tabs/day. This is a 30 day prescription. 12 tablet 6  . venlafaxine XR (EFFEXOR XR) 37.5 MG 24 hr capsule Take 1 capsule (37.5 mg total) by mouth daily with breakfast. 30 capsule 11   No facility-administered medications prior to visit.    PAST MEDICAL HISTORY: Past Medical History:  Diagnosis Date  . Anemia 2010   FE  . Anxiety   . Depression   . Headache   . Infection 02/2017   CHLAMYDIA  . Insomnia   . Migraine   . Pregnant   . Syncope   . Wrist fracture 2009 X2    PAST SURGICAL HISTORY: Past Surgical History:  Procedure Laterality Date  . NO PAST SURGERIES  FAMILY HISTORY: Family History  Problem Relation Age of Onset  . Kidney disease Mother        FAILURE  . Heart disease Mother        CHF  . Early death Mother 37  . Hypertension Mother   . Thrombophlebitis Mother   . Hyperlipidemia Maternal Grandmother   . Hypertension Maternal Grandmother     SOCIAL HISTORY: Social History   Socioeconomic History  . Marital status: Single    Spouse name: Not on file  . Number of  children: 1  . Years of education: 57  . Highest education level: Not on file  Occupational History  . Occupation: CUSTOMER SERVICE  Tobacco Use  . Smoking status: Never Smoker  . Smokeless tobacco: Never Used  Substance and Sexual Activity  . Alcohol use: Yes    Alcohol/week: 0.0 standard drinks    Comment: RARELY  . Drug use: No  . Sexual activity: Yes    Partners: Male    Birth control/protection: None  Other Topics Concern  . Not on file  Social History Narrative   Lives at home with boyfriend and daughter.   Right-handed.   No caffeine use.   Social Determinants of Health   Financial Resource Strain:   . Difficulty of Paying Living Expenses:   Food Insecurity:   . Worried About Programme researcher, broadcasting/film/video in the Last Year:   . Barista in the Last Year:   Transportation Needs:   . Freight forwarder (Medical):   Marland Kitchen Lack of Transportation (Non-Medical):   Physical Activity:   . Days of Exercise per Week:   . Minutes of Exercise per Session:   Stress:   . Feeling of Stress :   Social Connections:   . Frequency of Communication with Friends and Family:   . Frequency of Social Gatherings with Friends and Family:   . Attends Religious Services:   . Active Member of Clubs or Organizations:   . Attends Banker Meetings:   Marland Kitchen Marital Status:   Intimate Partner Violence:   . Fear of Current or Ex-Partner:   . Emotionally Abused:   Marland Kitchen Physically Abused:   . Sexually Abused:       PHYSICAL EXAM  There were no vitals filed for this visit. There is no height or weight on file to calculate BMI.  Generalized: Well developed, in no acute distress   Neurological examination  Mentation: Alert oriented to time, place, history taking. Follows all commands speech and language fluent Cranial nerve II-XII: Pupils were equal round reactive to light. Extraocular movements were full, visual field were full on confrontational test. Facial sensation and strength  were normal. Uvula tongue midline. Head turning and shoulder shrug  were normal and symmetric. Motor: The motor testing reveals 5 over 5 strength of all 4 extremities. Good symmetric motor tone is noted throughout.  Sensory: Sensory testing is intact to soft touch on all 4 extremities. No evidence of extinction is noted.  Coordination: Cerebellar testing reveals good finger-nose-finger and heel-to-shin bilaterally.  Gait and station: Gait is normal. Tandem gait is normal. Romberg is negative. No drift is seen.  Reflexes: Deep tendon reflexes are symmetric and normal bilaterally.   DIAGNOSTIC DATA (LABS, IMAGING, TESTING) - I reviewed patient records, labs, notes, testing and imaging myself where available.  Lab Results  Component Value Date   WBC 6.3 07/25/2019   HGB 11.5 (L) 07/25/2019   HCT 35.6 (L) 07/25/2019  MCV 76.7 (L) 07/25/2019   PLT 302 07/25/2019      Component Value Date/Time   NA 138 07/25/2019 1205   K 3.8 07/25/2019 1205   CL 107 07/25/2019 1205   CO2 22 07/25/2019 1205   GLUCOSE 90 07/25/2019 1205   BUN 8 07/25/2019 1205   CREATININE 0.88 07/25/2019 1205   CALCIUM 8.9 07/25/2019 1205   PROT 6.8 12/01/2017 0628   ALBUMIN 3.9 12/01/2017 0628   AST 14 (L) 12/01/2017 0628   ALT 9 12/01/2017 0628   ALKPHOS 40 12/01/2017 0628   BILITOT 1.0 12/01/2017 0628   GFRNONAA >60 07/25/2019 1205   GFRAA >60 07/25/2019 1205   Lab Results  Component Value Date   CHOL 151 12/01/2017   HDL 72 12/01/2017   LDLCALC 74 12/01/2017   TRIG 23 12/01/2017   CHOLHDL 2.1 12/01/2017   Lab Results  Component Value Date   HGBA1C 5.5 12/01/2017   No results found for: [id number] Lab Results  Component Value Date   TSH 0.959 12/01/2017      ASSESSMENT AND PLAN 29 y.o. year old female  has a past medical history of Anemia (2010), Anxiety, Depression, Headache, Infection (02/2017), Insomnia, Migraine, Pregnant, Syncope, and Wrist fracture (2009 X2). here with:  1.  Chronic  migraine headaches   I spent 15 minutes with the patient. 50% of this time was spent   Margie Ege, [place], DNP 12/25/2019, 5:24 AM [facility] [address], [address] [place], [place] [address] [phone]

## 2020-01-07 DIAGNOSIS — F332 Major depressive disorder, recurrent severe without psychotic features: Secondary | ICD-10-CM | POA: Diagnosis not present

## 2020-01-07 DIAGNOSIS — F411 Generalized anxiety disorder: Secondary | ICD-10-CM | POA: Diagnosis not present

## 2020-02-26 ENCOUNTER — Ambulatory Visit: Payer: Medicaid Other

## 2020-03-03 ENCOUNTER — Ambulatory Visit (INDEPENDENT_AMBULATORY_CARE_PROVIDER_SITE_OTHER): Payer: Medicaid Other

## 2020-03-03 ENCOUNTER — Other Ambulatory Visit: Payer: Self-pay

## 2020-03-03 VITALS — BP 119/80 | HR 87 | Wt 181.0 lb

## 2020-03-03 DIAGNOSIS — F332 Major depressive disorder, recurrent severe without psychotic features: Secondary | ICD-10-CM | POA: Diagnosis not present

## 2020-03-03 DIAGNOSIS — Z3042 Encounter for surveillance of injectable contraceptive: Secondary | ICD-10-CM

## 2020-03-03 DIAGNOSIS — F411 Generalized anxiety disorder: Secondary | ICD-10-CM | POA: Diagnosis not present

## 2020-03-03 MED ORDER — MEDROXYPROGESTERONE ACETATE 150 MG/ML IM SUSP
150.0000 mg | Freq: Once | INTRAMUSCULAR | Status: AC
  Administered 2020-03-03: 150 mg via INTRAMUSCULAR

## 2020-03-03 NOTE — Progress Notes (Signed)
29 y.o GYN presents for Depo Injection, given in [facility]OQ, tolerated well.  Next DEPO Dec. 29/21- Jan. 03/2021  Last PAP 01/03/2019  Administrations This Visit    medroxyPROGESTERone (DEPO-PROVERA) injection 150 mg    Admin Date 03/03/2020 Action Given Dose 150 mg Route Intramuscular Administered By Maretta Bees, RMA

## 2020-03-03 NOTE — Progress Notes (Signed)
Patient was assessed and managed by nursing staff during this encounter. I have reviewed the chart and agree with the documentation and plan. I have also made any necessary editorial changes.  Marylen Ponto, NP 03/03/2020 9:23 AM

## 2020-05-27 ENCOUNTER — Other Ambulatory Visit: Payer: Self-pay

## 2020-05-27 ENCOUNTER — Ambulatory Visit (INDEPENDENT_AMBULATORY_CARE_PROVIDER_SITE_OTHER): Payer: Medicaid Other | Admitting: *Deleted

## 2020-05-27 DIAGNOSIS — Z3042 Encounter for surveillance of injectable contraceptive: Secondary | ICD-10-CM | POA: Diagnosis not present

## 2020-05-27 MED ORDER — MEDROXYPROGESTERONE ACETATE 150 MG/ML IM SUSP
150.0000 mg | Freq: Once | INTRAMUSCULAR | Status: AC
  Administered 2020-05-27: 150 mg via INTRAMUSCULAR

## 2020-05-27 NOTE — Progress Notes (Signed)
Pt is in office for Depo injection. Pt is on time her Depo. Injection given, pt tolerated well.   Pt advised to RTO 3/23-08/26/20 for next Depo.   Administrations This Visit    medroxyPROGESTERone (DEPO-PROVERA) injection 150 mg    Admin Date 05/27/2020 Action Given Dose 150 mg Route Intramuscular Administered By Lanney Gins, CMA

## 2020-05-27 NOTE — Progress Notes (Deleted)
PATIENT: Anne Baxter DOB: Feb 09, 1991  REASON FOR VISIT: follow up HISTORY FROM: patient  HISTORY OF PRESENT ILLNESS: Today 05/27/20  HISTORY  Anne Baxter is a 30 year old female, seen in request by her OB/GYN Dr. Coral Ceo, and her primary care nurse practitioner Elane Fritz to reestablish the care for her migraine headaches.  I have reviewed and summarized the referring note from the referring physician.  I saw her initially in 2018 for passing out spells, she reported intermittent passing out spells since high school, that contributed to her heavy menstruation, low blood pressure, she described increased passing out spells, headaches since 2014 aft excessive stress,  She reported 1 episode on January 18 2015, it was Sunday morning, she was relaxing watching TV, suddenly felt stomach ache, urge to have a bowel movement, she was able to use bathroom, at the same time, she felt heart racing fast, sweaty, nauseous, fainting, she was able to was for help, was able to lie down in her bed,  she was able to hear her family talking, but was not able to respond episode last about 20 to 30 minutes, gradually recovered, there was no witnessed seizure-like activity.  Have ordered MRI of the brain, EEG, suggested cardiac monitoring at that time but she lost to follow-up, she denies recurrent passing out spell.  Today her focus is migraine headaches, which started since high school, is usually started on the left side, spreading forward become left retro-orbital area severe pounding headache with associated light noise sensitivity, sometimes preceded by visual aura, she has migraine headaches about once or twice each week, Imitrex 50 mg was only helpful 50% of the time, she has been taking frequent over-the-counter Excedrin Migraine which also only provide temporarily help  Trigger for her migraines are stress, bright light, exertion, sleep deprivation,  Laboratory evaluation in  2021, normal BMP, CBC showed mild anemia hemoglobin of 11.5, urine culture was positive for E. coli  Update May 28, 2020 SS:   REVIEW OF SYSTEMS: Out of a complete 14 system review of symptoms, the patient complains only of the following symptoms, and all other reviewed systems are negative.  ALLERGIES: No Known Allergies  HOME MEDICATIONS: Outpatient Medications Prior to Visit  Medication Sig Dispense Refill  . medroxyPROGESTERone (DEPO-PROVERA) 150 MG/ML injection Inject 1 mL (150 mg total) into the muscle every 3 (three) months. 1 mL 3  . mirtazapine (REMERON) 15 MG tablet Take 15 mg by mouth at bedtime.    . ondansetron (ZOFRAN ODT) 4 MG disintegrating tablet Take 1 tablet (4 mg total) by mouth every 8 (eight) hours as needed. 20 tablet 6  . rizatriptan (MAXALT-MLT) 10 MG disintegrating tablet Take 1 tab at onset of migraine.  May repeat in 2 hrs, if needed.  Max dose: 2 tabs/day. This is a 30 day prescription. (Patient not taking: Reported on 05/27/2020) 12 tablet 6  . venlafaxine XR (EFFEXOR XR) 37.5 MG 24 hr capsule Take 1 capsule (37.5 mg total) by mouth daily with breakfast. (Patient not taking: Reported on 05/27/2020) 30 capsule 11   No facility-administered medications prior to visit.    PAST MEDICAL HISTORY: Past Medical History:  Diagnosis Date  . Anemia 2010   FE  . Anxiety   . Depression   . Headache   . Infection 02/2017   CHLAMYDIA  . Insomnia   . Migraine   . Pregnant   . Syncope   . Wrist fracture 2009 X2    PAST SURGICAL  HISTORY: Past Surgical History:  Procedure Laterality Date  . NO PAST SURGERIES      FAMILY HISTORY: Family History  Problem Relation Age of Onset  . Kidney disease Mother        FAILURE  . Heart disease Mother        CHF  . Early death Mother 72  . Hypertension Mother   . Thrombophlebitis Mother   . Hyperlipidemia Maternal Grandmother   . Hypertension Maternal Grandmother     SOCIAL HISTORY: Social History    Socioeconomic History  . Marital status: Single    Spouse name: Not on file  . Number of children: 1  . Years of education: 3  . Highest education level: Not on file  Occupational History  . Occupation: CUSTOMER SERVICE  Tobacco Use  . Smoking status: Never Smoker  . Smokeless tobacco: Never Used  Substance and Sexual Activity  . Alcohol use: Yes    Alcohol/week: 0.0 standard drinks    Comment: RARELY  . Drug use: No  . Sexual activity: Yes    Partners: Male    Birth control/protection: None  Other Topics Concern  . Not on file  Social History Narrative   Lives at home with boyfriend and daughter.   Right-handed.   No caffeine use.   Social Determinants of Health   Financial Resource Strain: Not on file  Food Insecurity: Not on file  Transportation Needs: Not on file  Physical Activity: Not on file  Stress: Not on file  Social Connections: Not on file  Intimate Partner Violence: Not on file      PHYSICAL EXAM  There were no vitals filed for this visit. There is no height or weight on file to calculate BMI.  Generalized: Well developed, in no acute distress   Neurological examination  Mentation: Alert oriented to time, place, history taking. Follows all commands speech and language fluent Cranial nerve II-XII: Pupils were equal round reactive to light. Extraocular movements were full, visual field were full on confrontational test. Facial sensation and strength were normal. Uvula tongue midline. Head turning and shoulder shrug  were normal and symmetric. Motor: The motor testing reveals 5 over 5 strength of all 4 extremities. Good symmetric motor tone is noted throughout.  Sensory: Sensory testing is intact to soft touch on all 4 extremities. No evidence of extinction is noted.  Coordination: Cerebellar testing reveals good finger-nose-finger and heel-to-shin bilaterally.  Gait and station: Gait is normal. Tandem gait is normal. Romberg is negative. No drift is  seen.  Reflexes: Deep tendon reflexes are symmetric and normal bilaterally.   DIAGNOSTIC DATA (LABS, IMAGING, TESTING) - I reviewed patient records, labs, notes, testing and imaging myself where available.  Lab Results  Component Value Date   WBC 6.3 07/25/2019   HGB 11.5 (L) 07/25/2019   HCT 35.6 (L) 07/25/2019   MCV 76.7 (L) 07/25/2019   PLT 302 07/25/2019      Component Value Date/Time   NA 138 07/25/2019 1205   K 3.8 07/25/2019 1205   CL 107 07/25/2019 1205   CO2 22 07/25/2019 1205   GLUCOSE 90 07/25/2019 1205   BUN 8 07/25/2019 1205   CREATININE 0.88 07/25/2019 1205   CALCIUM 8.9 07/25/2019 1205   PROT 6.8 12/01/2017 0628   ALBUMIN 3.9 12/01/2017 0628   AST 14 (L) 12/01/2017 0628   ALT 9 12/01/2017 0628   ALKPHOS 40 12/01/2017 0628   BILITOT 1.0 12/01/2017 0628   GFRNONAA >60 07/25/2019 1205  GFRAA >60 07/25/2019 1205   Lab Results  Component Value Date   CHOL 151 12/01/2017   HDL 72 12/01/2017   LDLCALC 74 12/01/2017   TRIG 23 12/01/2017   CHOLHDL 2.1 12/01/2017   Lab Results  Component Value Date   HGBA1C 5.5 12/01/2017   No results found for: [id number] Lab Results  Component Value Date   TSH 0.959 12/01/2017      ASSESSMENT AND PLAN 30 y.o. year old female  has a past medical history of Anemia (2010), Anxiety, Depression, Headache, Infection (02/2017), Insomnia, Migraine, Pregnant, Syncope, and Wrist fracture (2009 X2). here with:  1.  Chronic migraine headache   I spent 15 minutes with the patient. 50% of this time was spent   Butler Denmark, [place], DNP 05/27/2020, 2:07 PM [facility] [address], [place], [place] [address] [phone]

## 2020-05-27 NOTE — Progress Notes (Signed)
I have reviewed this chart and agree with the RN/CMA assessment and management.    K. Meryl Santiago Stenzel, MD, FACOG Attending [facility] ([facility])  

## 2020-05-28 ENCOUNTER — Telehealth: Payer: Self-pay | Admitting: Neurology

## 2020-05-28 ENCOUNTER — Ambulatory Visit: Payer: Medicaid Other | Admitting: Neurology

## 2020-05-28 NOTE — Telephone Encounter (Signed)
She will need to be dismissed from our practice.

## 2020-05-28 NOTE — Telephone Encounter (Signed)
FYI: Pt had third no show within six months today.

## 2020-05-28 NOTE — Telephone Encounter (Signed)
Noted  

## 2020-06-02 ENCOUNTER — Encounter: Payer: Self-pay | Admitting: Neurology

## 2020-06-10 DIAGNOSIS — F411 Generalized anxiety disorder: Secondary | ICD-10-CM | POA: Diagnosis not present

## 2020-06-10 DIAGNOSIS — F332 Major depressive disorder, recurrent severe without psychotic features: Secondary | ICD-10-CM | POA: Diagnosis not present

## 2020-08-11 ENCOUNTER — Other Ambulatory Visit: Payer: Self-pay | Admitting: Obstetrics & Gynecology

## 2020-08-11 DIAGNOSIS — Z01419 Encounter for gynecological examination (general) (routine) without abnormal findings: Secondary | ICD-10-CM

## 2020-08-11 DIAGNOSIS — Z30013 Encounter for initial prescription of injectable contraceptive: Secondary | ICD-10-CM

## 2020-08-12 ENCOUNTER — Ambulatory Visit (INDEPENDENT_AMBULATORY_CARE_PROVIDER_SITE_OTHER): Payer: Medicaid Other

## 2020-08-12 ENCOUNTER — Encounter: Payer: Self-pay | Admitting: Obstetrics and Gynecology

## 2020-08-12 ENCOUNTER — Other Ambulatory Visit (HOSPITAL_COMMUNITY)
Admission: RE | Admit: 2020-08-12 | Discharge: 2020-08-12 | Disposition: A | Discharge status: Home / Self Care | Payer: Medicaid Other | Source: Ambulatory Visit | Attending: Obstetrics | Admitting: Obstetrics

## 2020-08-12 ENCOUNTER — Other Ambulatory Visit: Payer: Self-pay

## 2020-08-12 VITALS — BP 123/85 | HR 78 | Wt 188.0 lb

## 2020-08-12 DIAGNOSIS — N898 Other specified noninflammatory disorders of vagina: Secondary | ICD-10-CM | POA: Insufficient documentation

## 2020-08-12 DIAGNOSIS — Z3042 Encounter for surveillance of injectable contraceptive: Secondary | ICD-10-CM

## 2020-08-12 MED ORDER — MEDROXYPROGESTERONE ACETATE 150 MG/ML IM SUSP
150.0000 mg | Freq: Once | INTRAMUSCULAR | Status: AC
  Administered 2020-08-12: 150 mg via INTRAMUSCULAR

## 2020-08-12 NOTE — Progress Notes (Signed)
SUBJECTIVE:  30 y.o. female complains of vag discharge  vaginal discharge for duration:Denies abnormal vaginal bleeding or significant pelvic pain or fever. No UTI symptoms. Denies history of known exposure to STD.  No LMP recorded. Patient has had an injection. Depo given in left gluteal today.  [facility] #[id number] She is on schedule for her depo-provera   OBJECTIVE:  She appears well, afebrile. Urine dipstick:   ASSESSMENT:  Vaginal Discharge small amount  Vaginal Odor : No   PLAN:  GC, chlamydia, trichomonas, BVAG, CVAG probe sent to lab. Treatment: To be determined once lab results are received ROV prn if symptoms persist or worsen.

## 2020-08-13 ENCOUNTER — Other Ambulatory Visit: Payer: Self-pay | Admitting: Obstetrics

## 2020-08-13 DIAGNOSIS — B9689 Other specified bacterial agents as the cause of diseases classified elsewhere: Secondary | ICD-10-CM

## 2020-08-13 DIAGNOSIS — N760 Acute vaginitis: Secondary | ICD-10-CM

## 2020-08-13 LAB — CERVICOVAGINAL ANCILLARY ONLY
Bacterial Vaginitis (gardnerella): POSITIVE — AB
Candida Glabrata: NEGATIVE
Candida Vaginitis: NEGATIVE
Chlamydia: NEGATIVE
Comment: NEGATIVE
Comment: NEGATIVE
Comment: NEGATIVE
Comment: NEGATIVE
Comment: NEGATIVE
Comment: NORMAL
Neisseria Gonorrhea: NEGATIVE
Trichomonas: NEGATIVE

## 2020-08-13 MED ORDER — METRONIDAZOLE 500 MG PO TABS: 500.0000 mg | ORAL_TABLET | Freq: Two times a day (BID) | ORAL | 2 refills | Status: DC

## 2020-08-17 DIAGNOSIS — F332 Major depressive disorder, recurrent severe without psychotic features: Secondary | ICD-10-CM | POA: Diagnosis not present

## 2020-08-17 DIAGNOSIS — F411 Generalized anxiety disorder: Secondary | ICD-10-CM | POA: Diagnosis not present

## 2020-11-02 ENCOUNTER — Ambulatory Visit: Payer: Medicaid Other

## 2020-11-03 DIAGNOSIS — F411 Generalized anxiety disorder: Secondary | ICD-10-CM | POA: Diagnosis not present

## 2020-11-03 DIAGNOSIS — F332 Major depressive disorder, recurrent severe without psychotic features: Secondary | ICD-10-CM | POA: Diagnosis not present

## 2020-11-04 ENCOUNTER — Ambulatory Visit: Payer: Medicaid Other

## 2020-11-06 ENCOUNTER — Ambulatory Visit (INDEPENDENT_AMBULATORY_CARE_PROVIDER_SITE_OTHER): Payer: Medicaid Other

## 2020-11-06 ENCOUNTER — Other Ambulatory Visit: Payer: Self-pay

## 2020-11-06 DIAGNOSIS — Z3042 Encounter for surveillance of injectable contraceptive: Secondary | ICD-10-CM

## 2020-11-06 MED ORDER — MEDROXYPROGESTERONE ACETATE 150 MG/ML IM SUSP
150.0000 mg | Freq: Once | INTRAMUSCULAR | Status: AC
  Administered 2020-11-06: 150 mg via INTRAMUSCULAR

## 2020-11-06 NOTE — Progress Notes (Signed)
Patient was assessed and managed by nursing staff during this encounter. I have reviewed the chart and agree with the documentation and plan. I have also made any necessary editorial changes.  Catalina Antigua, MD 11/06/2020 11:36 AM

## 2020-11-06 NOTE — Progress Notes (Signed)
Subjective:  Pt in for Depo Provera injection.    Objective: Need for contraception. No unusual complaints.    Assessment: Per pt's request, Depo given R upper outer quadrant. Pt tolerated injection well.   Plan:  Next injection due Sept 2-16

## 2020-12-31 DIAGNOSIS — F332 Major depressive disorder, recurrent severe without psychotic features: Secondary | ICD-10-CM | POA: Diagnosis not present

## 2020-12-31 DIAGNOSIS — F411 Generalized anxiety disorder: Secondary | ICD-10-CM | POA: Diagnosis not present

## 2021-01-27 ENCOUNTER — Ambulatory Visit: Payer: Medicaid Other

## 2021-02-03 ENCOUNTER — Other Ambulatory Visit: Payer: Self-pay

## 2021-02-03 ENCOUNTER — Ambulatory Visit (INDEPENDENT_AMBULATORY_CARE_PROVIDER_SITE_OTHER): Payer: Medicaid Other

## 2021-02-03 VITALS — BP 112/82 | HR 78

## 2021-02-03 DIAGNOSIS — Z3042 Encounter for surveillance of injectable contraceptive: Secondary | ICD-10-CM | POA: Diagnosis not present

## 2021-02-03 MED ORDER — MEDROXYPROGESTERONE ACETATE 150 MG/ML IM SUSP
150.0000 mg | Freq: Once | INTRAMUSCULAR | Status: AC
  Administered 2021-02-03: 150 mg via INTRAMUSCULAR

## 2021-02-03 NOTE — Progress Notes (Signed)
Patient presents to clinic today for depo-provera. Given By: D.Cheree Ditto Dose: Depo-Provera 150 mg Route: RUOQ HCG Serum: N/A Side Effects: No NDC# [phone]

## 2021-02-11 ENCOUNTER — Ambulatory Visit: Payer: Medicaid Other

## 2021-02-11 ENCOUNTER — Other Ambulatory Visit: Payer: Self-pay

## 2021-02-11 DIAGNOSIS — R300 Dysuria: Secondary | ICD-10-CM

## 2021-02-11 MED ORDER — NITROFURANTOIN MONOHYD MACRO 100 MG PO CAPS: 100.0000 mg | ORAL_CAPSULE | Freq: Two times a day (BID) | ORAL | 0 refills | Status: DC

## 2021-03-31 DIAGNOSIS — F411 Generalized anxiety disorder: Secondary | ICD-10-CM | POA: Diagnosis not present

## 2021-03-31 DIAGNOSIS — F332 Major depressive disorder, recurrent severe without psychotic features: Secondary | ICD-10-CM | POA: Diagnosis not present

## 2021-04-07 ENCOUNTER — Other Ambulatory Visit (HOSPITAL_COMMUNITY)
Admission: RE | Admit: 2021-04-07 | Discharge: 2021-04-07 | Disposition: A | Discharge status: Home / Self Care | Payer: Medicaid Other | Source: Ambulatory Visit | Attending: Obstetrics and Gynecology | Admitting: Obstetrics and Gynecology

## 2021-04-07 ENCOUNTER — Other Ambulatory Visit: Payer: Self-pay

## 2021-04-07 ENCOUNTER — Ambulatory Visit (INDEPENDENT_AMBULATORY_CARE_PROVIDER_SITE_OTHER): Payer: Medicaid Other | Admitting: *Deleted

## 2021-04-07 DIAGNOSIS — N760 Acute vaginitis: Secondary | ICD-10-CM

## 2021-04-07 DIAGNOSIS — Z113 Encounter for screening for infections with a predominantly sexual mode of transmission: Secondary | ICD-10-CM | POA: Diagnosis present

## 2021-04-07 NOTE — Progress Notes (Signed)
Agree with nurses's documentation of this patient's clinic encounter.  Korbin Notaro L, MD  

## 2021-04-07 NOTE — Progress Notes (Signed)
SUBJECTIVE:  30 y.o. female complains of vaginal itching and irritation for 6 day(s). Denies abnormal vaginal bleeding or significant pelvic pain or fever. No UTI symptoms. Denies history of known exposure to STD.  No LMP recorded. Patient has had an injection.  OBJECTIVE:  She appears well, afebrile. Urine dipstick: not done.  ASSESSMENT:  Vaginal Discharge  Vaginal Odor   PLAN:  GC, chlamydia, trichomonas, BVAG, CVAG probe sent to lab. Treatment: To be determined once lab results are received ROV prn if symptoms persist or worsen.

## 2021-04-08 LAB — CERVICOVAGINAL ANCILLARY ONLY
Bacterial Vaginitis (gardnerella): NEGATIVE
Candida Glabrata: NEGATIVE
Candida Vaginitis: POSITIVE — AB
Chlamydia: NEGATIVE
Comment: NEGATIVE
Comment: NEGATIVE
Comment: NEGATIVE
Comment: NEGATIVE
Comment: NEGATIVE
Comment: NORMAL
Neisseria Gonorrhea: NEGATIVE
Trichomonas: NEGATIVE

## 2021-04-09 ENCOUNTER — Other Ambulatory Visit: Payer: Self-pay

## 2021-04-09 DIAGNOSIS — N760 Acute vaginitis: Secondary | ICD-10-CM

## 2021-04-09 MED ORDER — FLUCONAZOLE 150 MG PO TABS: 150.0000 mg | ORAL_TABLET | Freq: Once | ORAL | 3 refills | Status: AC

## 2021-04-27 ENCOUNTER — Ambulatory Visit: Payer: Medicaid Other

## 2021-04-28 ENCOUNTER — Other Ambulatory Visit: Payer: Self-pay

## 2021-04-28 ENCOUNTER — Ambulatory Visit (INDEPENDENT_AMBULATORY_CARE_PROVIDER_SITE_OTHER): Payer: Medicaid Other

## 2021-04-28 DIAGNOSIS — Z3042 Encounter for surveillance of injectable contraceptive: Secondary | ICD-10-CM

## 2021-04-28 MED ORDER — MEDROXYPROGESTERONE ACETATE 150 MG/ML IM SUSP
150.0000 mg | Freq: Once | INTRAMUSCULAR | Status: AC
  Administered 2021-04-28: 150 mg via INTRAMUSCULAR

## 2021-04-28 NOTE — Progress Notes (Signed)
Patient presents for a depo injection. Patient is within her window. Inj given in LUOQ. Patient tolerated well. Next depo due 2/22-3/8

## 2021-05-26 NOTE — Progress Notes (Signed)
Patient was assessed and managed by nursing staff during this encounter. I have reviewed the chart and agree with the documentation and plan. I have also made any necessary editorial changes.  Scheryl Darter, MD 05/26/2021 10:53 AM

## 2021-06-29 DIAGNOSIS — F411 Generalized anxiety disorder: Secondary | ICD-10-CM | POA: Diagnosis not present

## 2021-06-29 DIAGNOSIS — F332 Major depressive disorder, recurrent severe without psychotic features: Secondary | ICD-10-CM | POA: Diagnosis not present

## 2021-07-19 ENCOUNTER — Telehealth: Payer: Self-pay

## 2021-07-19 ENCOUNTER — Other Ambulatory Visit: Payer: Self-pay | Admitting: Obstetrics & Gynecology

## 2021-07-19 ENCOUNTER — Ambulatory Visit (INDEPENDENT_AMBULATORY_CARE_PROVIDER_SITE_OTHER): Payer: Medicaid Other

## 2021-07-19 ENCOUNTER — Other Ambulatory Visit: Payer: Self-pay

## 2021-07-19 DIAGNOSIS — Z3042 Encounter for surveillance of injectable contraceptive: Secondary | ICD-10-CM

## 2021-07-19 DIAGNOSIS — Z01419 Encounter for gynecological examination (general) (routine) without abnormal findings: Secondary | ICD-10-CM

## 2021-07-19 DIAGNOSIS — Z30013 Encounter for initial prescription of injectable contraceptive: Secondary | ICD-10-CM

## 2021-07-19 MED ORDER — MEDROXYPROGESTERONE ACETATE 150 MG/ML IM SUSP
150.0000 mg | Freq: Once | INTRAMUSCULAR | Status: AC
  Administered 2021-07-19: 150 mg via INTRAMUSCULAR

## 2021-07-19 MED ORDER — MEDROXYPROGESTERONE ACETATE 150 MG/ML IM SUSP: 150.0000 mg | INTRAMUSCULAR | 0 refills | Status: DC

## 2021-07-19 NOTE — Telephone Encounter (Signed)
Attempted to contact, no vm available, pt requesting depo refill due to appt today for inj. Sent one refill, will advise that annual appt is needed.

## 2021-07-19 NOTE — Progress Notes (Signed)
Subjective:  Pt in Depo Provera injection. Used pt's supply   Objective: Need for contraception. No unusual complaints.    Assessment: Pt tolerated Depo injection. Depo given R upper outer quadrant.   Plan:  Next injection due May 15-29.   Pt needs an annual - Annual scheduled Sep 01, 2021   Administrations This Visit     medroxyPROGESTERone (DEPO-PROVERA) injection 150 mg     Admin Date 07/19/2021 Action Given Dose 150 mg Route Intramuscular Administered By Lewayne Bunting, CMA

## 2021-07-19 NOTE — Progress Notes (Signed)
Patient was assessed and managed by nursing staff during this encounter. I have reviewed the chart and agree with the documentation and plan. I have also made any necessary editorial changes.  Catalina Antigua, MD 07/19/2021 4:18 PM

## 2021-09-01 ENCOUNTER — Ambulatory Visit: Payer: Medicaid Other | Admitting: Obstetrics and Gynecology

## 2021-09-22 DIAGNOSIS — F332 Major depressive disorder, recurrent severe without psychotic features: Secondary | ICD-10-CM | POA: Diagnosis not present

## 2021-09-22 DIAGNOSIS — F411 Generalized anxiety disorder: Secondary | ICD-10-CM | POA: Diagnosis not present

## 2021-09-24 DIAGNOSIS — L42 Pityriasis rosea: Secondary | ICD-10-CM | POA: Diagnosis not present

## 2021-10-04 ENCOUNTER — Ambulatory Visit (INDEPENDENT_AMBULATORY_CARE_PROVIDER_SITE_OTHER): Payer: Medicaid Other

## 2021-10-04 ENCOUNTER — Other Ambulatory Visit: Payer: Self-pay | Admitting: Obstetrics

## 2021-10-04 ENCOUNTER — Other Ambulatory Visit: Payer: Self-pay | Admitting: *Deleted

## 2021-10-04 DIAGNOSIS — Z30013 Encounter for initial prescription of injectable contraceptive: Secondary | ICD-10-CM

## 2021-10-04 DIAGNOSIS — Z3042 Encounter for surveillance of injectable contraceptive: Secondary | ICD-10-CM

## 2021-10-04 DIAGNOSIS — Z01419 Encounter for gynecological examination (general) (routine) without abnormal findings: Secondary | ICD-10-CM

## 2021-10-04 MED ORDER — MEDROXYPROGESTERONE ACETATE 150 MG/ML IM SUSP: INTRAMUSCULAR | 3 refills | Status: DC

## 2021-10-04 MED ORDER — MEDROXYPROGESTERONE ACETATE 150 MG/ML IM SUSP
150.0000 mg | INTRAMUSCULAR | Status: AC
  Administered 2021-10-04: 150 mg via INTRAMUSCULAR

## 2021-10-04 NOTE — Progress Notes (Signed)
Patient was assessed and managed by nursing staff during this encounter. I have reviewed the chart and agree with the documentation and plan. I have also made any necessary editorial changes. ? ?Catalina Antigua, MD ?10/04/2021 7:28 PM  ? ?

## 2021-10-04 NOTE — Progress Notes (Signed)
Pt is in the office for depo injection, administered in LUOQ and pt tolerated well. ?Next due July 31- Aug 14. ?.. ?Administrations This Visit   ? ? medroxyPROGESTERone (DEPO-PROVERA) injection 150 mg   ? ? Admin Date ?10/04/2021 Action ?Given Dose ?150 mg Route ?Intramuscular Administered By ?Katrina Stack, RN  ? ?  ?  ? ?  ?  ?

## 2021-10-04 NOTE — Progress Notes (Signed)
Pt called for refill Depo for today's injection. Needs annual. Scheduled for 10/20/21. Pt informed that one refill would be send but that no other refills would be sent until she is seen for her annual exam. ?

## 2021-10-20 ENCOUNTER — Ambulatory Visit: Payer: Medicaid Other | Admitting: Obstetrics

## 2021-10-21 IMAGING — US US PELVIS COMPLETE WITH TRANSVAGINAL
1 series · 15 of 25 positions shown · non-contrast
Comparison: None

CLINICAL DATA: Pelvic pain



[Series 1: us pelvis complete with transvaginal · 15 of 84 slices shown]
[im 1/84]
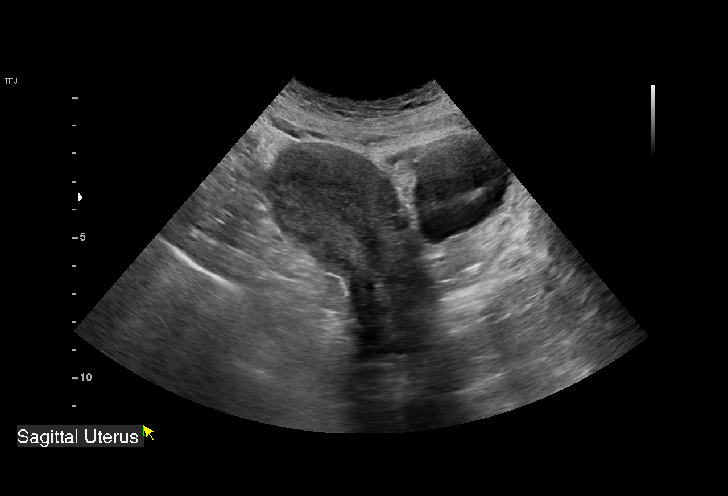
[im 7/84]
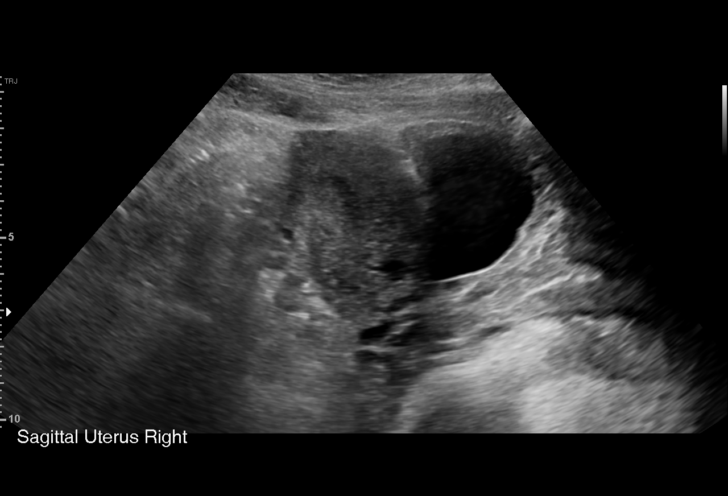
[im 14/84]
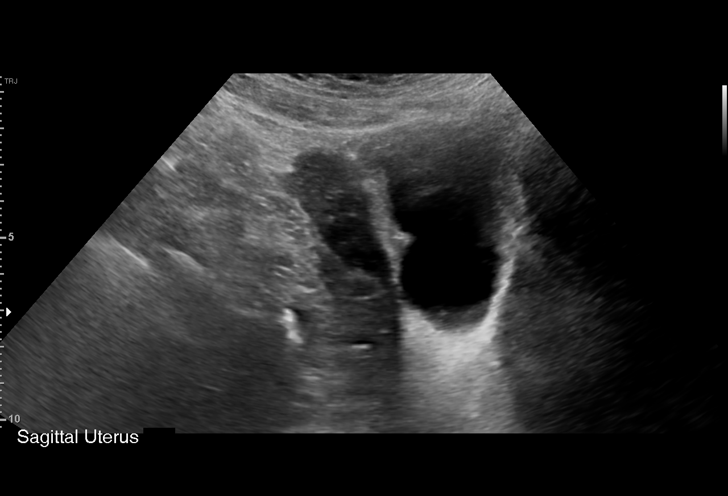
[im 18/84]
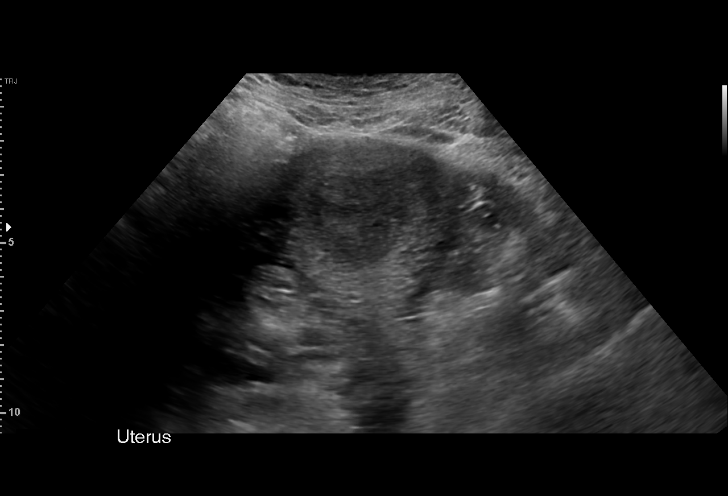
[im 25/84]
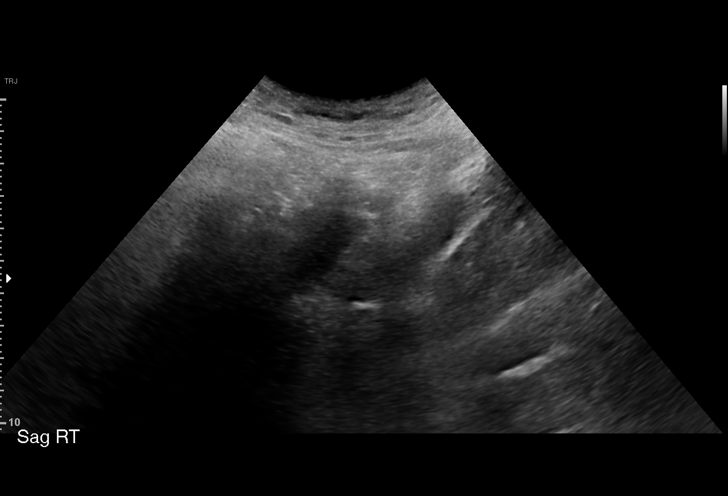
[im 32/84]
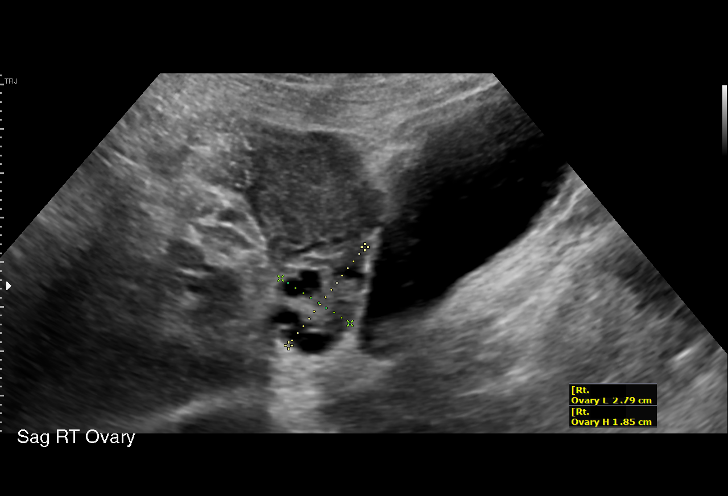
[im 35/84]
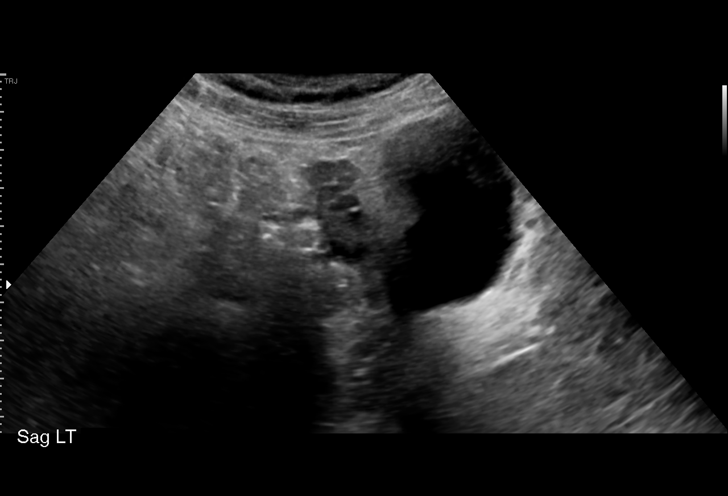
[im 42/84]
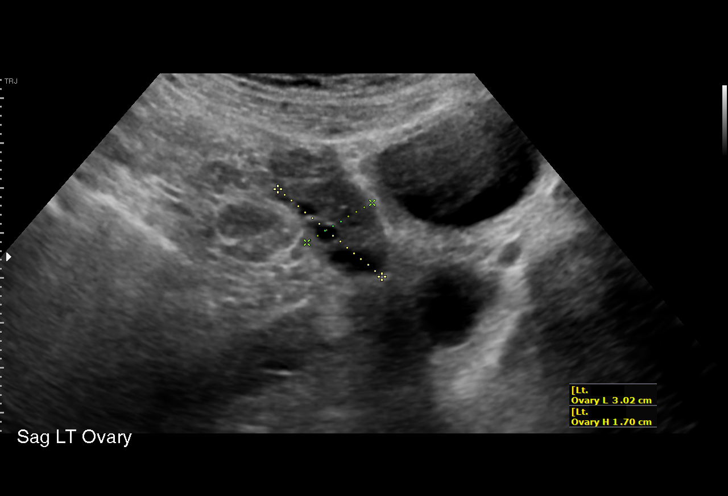
[im 49/84]
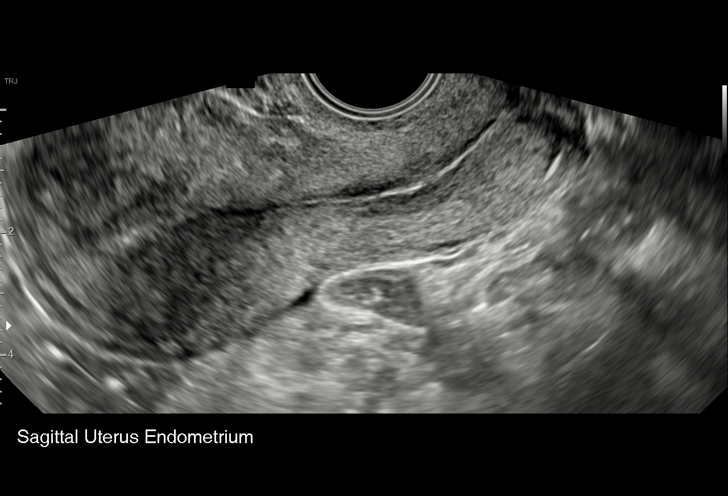
[im 52/84]
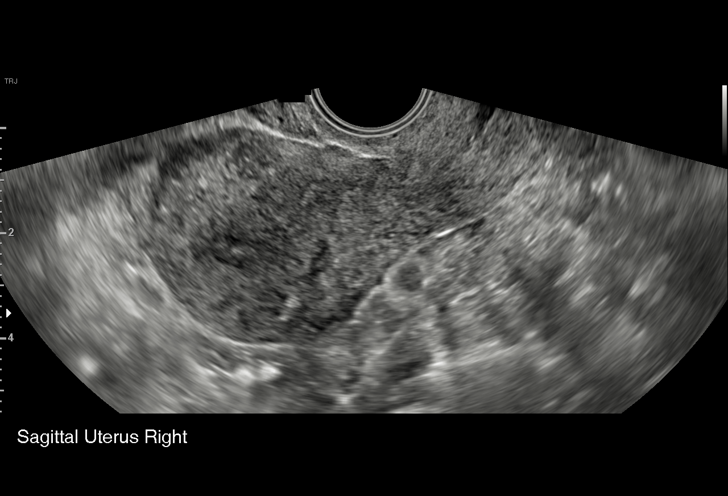
[im 59/84]
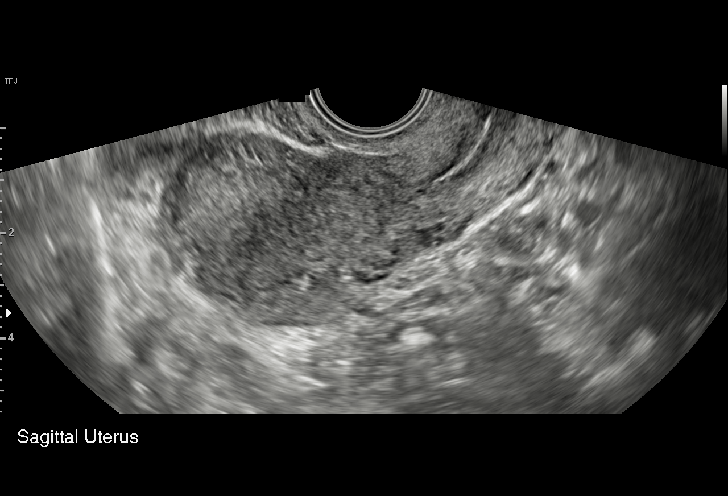
[im 66/84]
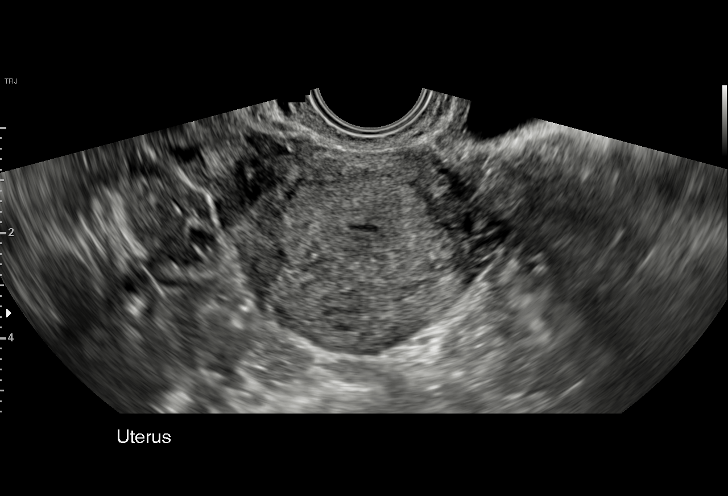
[im 70/84]
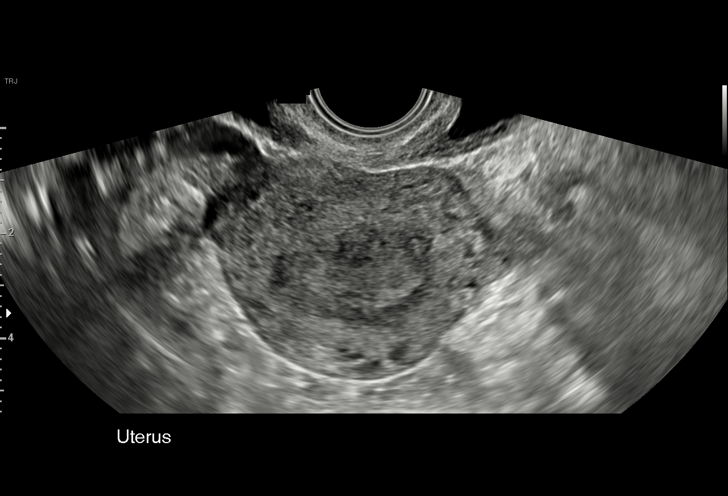
[im 77/84]
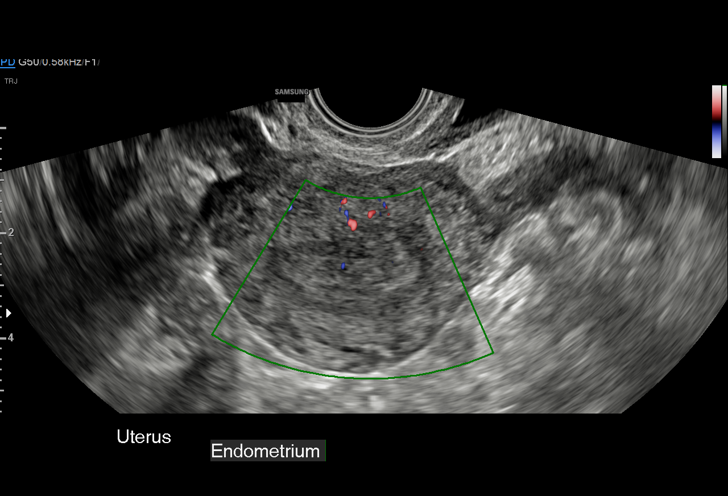
[im 84/84]
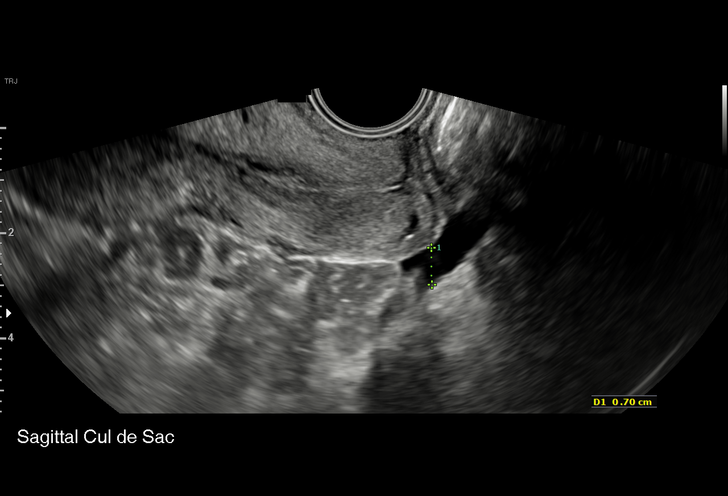

[15 of 25 positions shown; findings below may reference images not displayed]

FINDINGS: Uterus

Measurements: 8.6 x 4.2 x 5.7 cm = volume: 107 mL. Mildly
heterogeneous myometrium without focal mass

Endometrium

Thickness: 4 mm.  No endometrial fluid or focal abnormality

Right ovary

Measurements: 3.4 x 1.7 x 2.5 cm = volume: 7.4 mL. Normal morphology
without mass. Internal blood flow present on color Doppler imaging.

Left ovary

Measurements: 2.8 x 1.4 x 2.4 cm = volume: 4.8 mL. Normal morphology
without mass. Internal blood flow present on color Doppler imaging.

Other findings

Trace free pelvic fluid.

No adnexal masses.
IMPRESSION: No pelvic sonographic abnormalities.

## 2021-11-02 ENCOUNTER — Ambulatory Visit: Payer: Medicaid Other | Admitting: Obstetrics

## 2021-12-20 DIAGNOSIS — F411 Generalized anxiety disorder: Secondary | ICD-10-CM | POA: Diagnosis not present

## 2021-12-20 DIAGNOSIS — F332 Major depressive disorder, recurrent severe without psychotic features: Secondary | ICD-10-CM | POA: Diagnosis not present

## 2021-12-21 ENCOUNTER — Other Ambulatory Visit (HOSPITAL_COMMUNITY)
Admission: RE | Admit: 2021-12-21 | Discharge: 2021-12-21 | Disposition: A | Discharge status: Home / Self Care | Payer: Medicaid Other | Source: Ambulatory Visit | Attending: Obstetrics and Gynecology | Admitting: Obstetrics and Gynecology

## 2021-12-21 ENCOUNTER — Encounter: Payer: Self-pay | Admitting: Obstetrics and Gynecology

## 2021-12-21 ENCOUNTER — Ambulatory Visit: Payer: Medicaid Other

## 2021-12-21 ENCOUNTER — Ambulatory Visit (INDEPENDENT_AMBULATORY_CARE_PROVIDER_SITE_OTHER): Payer: Medicaid Other | Admitting: Obstetrics and Gynecology

## 2021-12-21 VITALS — BP 116/80 | HR 88 | Ht 66.0 in | Wt 221.0 lb

## 2021-12-21 DIAGNOSIS — Z01419 Encounter for gynecological examination (general) (routine) without abnormal findings: Secondary | ICD-10-CM | POA: Insufficient documentation

## 2021-12-21 DIAGNOSIS — Z3042 Encounter for surveillance of injectable contraceptive: Secondary | ICD-10-CM | POA: Diagnosis not present

## 2021-12-21 DIAGNOSIS — Z202 Contact with and (suspected) exposure to infections with a predominantly sexual mode of transmission: Secondary | ICD-10-CM | POA: Insufficient documentation

## 2021-12-21 DIAGNOSIS — R87612 Low grade squamous intraepithelial lesion on cytologic smear of cervix (LGSIL): Secondary | ICD-10-CM | POA: Diagnosis not present

## 2021-12-21 MED ORDER — MEDROXYPROGESTERONE ACETATE 150 MG/ML IM SUSP
150.0000 mg | Freq: Once | INTRAMUSCULAR | Status: AC
  Administered 2021-12-21: 150 mg via INTRAMUSCULAR

## 2021-12-21 NOTE — Patient Instructions (Signed)

## 2021-12-21 NOTE — Progress Notes (Signed)
Anne Baxter is a 31 y.o. [phone] female here for a routine annual gynecologic exam.  Current complaints: none.   Denies abnormal vaginal bleeding, discharge, pelvic pain, problems with intercourse or other gynecologic concerns.    Gynecologic History No LMP recorded. Patient has had an injection. Contraception: Depo-Provera injections Last Pap: 2020. Results were: abnormal Last mammogram: NA.   Obstetric History OB History  Gravida Para Term Preterm AB Living  4 2 2  0 1 2  SAB IAB Ectopic Multiple Live Births  0 1 0 0 2    # Outcome Date GA Lbr Len/2nd Weight Sex Delivery Anes PTL Lv  4 Gravida           3 Term 06/01/16 [redacted]w[redacted]d 01:42 / 00:16 6 lb 4.5 oz (2.849 kg) M Vag-Spont None  LIV  2 Term 10/31/12 [redacted]w[redacted]d 13:30 / 01:44 6 lb 6.1 oz (2.895 kg) F Vag-Spont EPI  LIV  1 IAB 10/2011 [redacted]w[redacted]d    TAB       Past Medical History:  Diagnosis Date   Anemia 2010   FE   Anxiety    Depression    Headache    Infection 02/2017   CHLAMYDIA   Insomnia    Migraine    Pregnant    Syncope    Wrist fracture 2009 X2    Past Surgical History:  Procedure Laterality Date   NO PAST SURGERIES      Current Outpatient Medications on File Prior to Visit  Medication Sig Dispense Refill   medroxyPROGESTERone (DEPO-PROVERA) 150 MG/ML injection Inject 1 mL (150 mg total) into the muscle every 3 (three) months. 1 mL 0   mirtazapine (REMERON) 15 MG tablet Take 15 mg by mouth at bedtime.     medroxyPROGESTERone (DEPO-PROVERA) 150 MG/ML injection ADMINISTER 1 ML(150 MG) IN THE MUSCLE EVERY 3 MONTHS 1 mL 3   Current Facility-Administered Medications on File Prior to Visit  Medication Dose Route Frequency Provider Last Rate Last Admin   medroxyPROGESTERone (DEPO-PROVERA) injection 150 mg  150 mg Intramuscular Q90 days Constant, Peggy, MD   150 mg at 10/04/21 1610    No Known Allergies  Social History   Socioeconomic History   Marital status: Single    Spouse name: Not on file   Number of  children: 1   Years of education: 15   Highest education level: Not on file  Occupational History   Occupation: CUSTOMER SERVICE  Tobacco Use   Smoking status: Never   Smokeless tobacco: Never  Substance and Sexual Activity   Alcohol use: Yes    Alcohol/week: 0.0 standard drinks of alcohol    Comment: RARELY   Drug use: No   Sexual activity: Yes    Partners: Male    Birth control/protection: Injection  Other Topics Concern   Not on file  Social History Narrative   Lives at home with boyfriend and daughter.   Right-handed.   No caffeine use.   Social Determinants of Health   Financial Resource Strain: Not on file  Food Insecurity: Not on file  Transportation Needs: Not on file  Physical Activity: Not on file  Stress: Not on file  Social Connections: Not on file  Intimate Partner Violence: Not on file    Family History  Problem Relation Age of Onset   Kidney disease Mother        FAILURE   Heart disease Mother        CHF   Early death Mother 99  Hypertension Mother    Thrombophlebitis Mother    Hyperlipidemia Maternal Grandmother    Hypertension Maternal Grandmother     The following portions of the patient's history were reviewed and updated as appropriate: allergies, current medications, past family history, past medical history, past social history, past surgical history and problem list.  Review of Systems Pertinent items noted in HPI and remainder of comprehensive ROS otherwise negative.   Objective:  BP 116/80   Pulse 88   Ht 5\' 6"  (1.676 m)   Wt 221 lb (100.2 kg)   BMI 35.67 kg/m  CONSTITUTIONAL: Well-developed, well-nourished female in no acute distress.  HENT:  Normocephalic, atraumatic, External right and left ear normal. Oropharynx is clear and moist EYES: Conjunctivae and EOM are normal. Pupils are equal, round, and reactive to light. No scleral icterus.  NECK: Normal range of motion, supple, no masses.  Normal thyroid.  SKIN: Skin is warm and  dry. No rash noted. Not diaphoretic. No erythema. No pallor. NEUROLGIC: Alert and oriented to R[place], place, and time. Normal reflexes, muscle tone coordination. No cranial nerve deficit noted. PSYCHIATRIC: Normal mood and affect. Normal behavior. Normal judgment and thought content. CARDIOVASCULAR: Normal heart rate noted, regular rhythm RESPIRATORY: Clear to auscultation bilaterally. Effort and breath sounds normal, no problems with respiration noted. BREASTS: Symmetric in size. No masses, skin changes, nipple drainage, or lymphadenopathy. ABDOMEN: Soft, normal bowel sounds, no distention noted.  No tenderness, rebound or guarding.  PELVIC: Normal appearing external genitalia; normal appearing vaginal mucosa and cervix.  No abnormal discharge noted.  Pap smear obtained.  Normal uterine size, no other palpable masses, no uterine or adnexal tenderness. MUSCULOSKELETAL: Normal range of motion. No tenderness.  No cyanosis, clubbing, or edema.  2+ distal pulses.   Assessment:  Annual gynecologic examination with pap smear STD testing Contraception management Plan:  Will follow up results of pap smear and manage accordingly. STD testing. Continue with Depo Provera Routine preventative health maintenance measures emphasized. Please refer to After Visit Summary for other counseling recommendations.    , MD, FACOG Attending Obstetrician & Gynecologist Center for [facility], [facility]

## 2021-12-22 LAB — CERVICOVAGINAL ANCILLARY ONLY
Bacterial Vaginitis (gardnerella): POSITIVE — AB
Candida Glabrata: NEGATIVE
Candida Vaginitis: NEGATIVE
Chlamydia: NEGATIVE
Comment: NEGATIVE
Comment: NEGATIVE
Comment: NEGATIVE
Comment: NEGATIVE
Comment: NEGATIVE
Comment: NORMAL
Neisseria Gonorrhea: NEGATIVE
Trichomonas: NEGATIVE

## 2021-12-22 LAB — RPR+HBSAG+HCVAB+...
HIV Screen 4th Generation wRfx: NONREACTIVE
Hep C Virus Ab: NONREACTIVE
Hepatitis B Surface Ag: NEGATIVE
RPR Ser Ql: NONREACTIVE

## 2021-12-23 ENCOUNTER — Other Ambulatory Visit: Payer: Self-pay

## 2021-12-23 MED ORDER — METRONIDAZOLE 500 MG PO TABS: 500.0000 mg | ORAL_TABLET | Freq: Two times a day (BID) | ORAL | 0 refills | Status: DC

## 2021-12-27 LAB — CYTOLOGY - PAP
Comment: NEGATIVE
Diagnosis: NEGATIVE
High risk HPV: NEGATIVE

## 2022-03-04 DIAGNOSIS — F332 Major depressive disorder, recurrent severe without psychotic features: Secondary | ICD-10-CM | POA: Diagnosis not present

## 2022-03-04 DIAGNOSIS — F411 Generalized anxiety disorder: Secondary | ICD-10-CM | POA: Diagnosis not present

## 2022-03-14 DIAGNOSIS — R051 Acute cough: Secondary | ICD-10-CM | POA: Diagnosis not present

## 2022-03-14 DIAGNOSIS — J029 Acute pharyngitis, unspecified: Secondary | ICD-10-CM | POA: Diagnosis not present

## 2022-03-14 DIAGNOSIS — J069 Acute upper respiratory infection, unspecified: Secondary | ICD-10-CM | POA: Diagnosis not present

## 2022-03-18 ENCOUNTER — Ambulatory Visit: Payer: Medicaid Other

## 2022-03-22 ENCOUNTER — Ambulatory Visit (INDEPENDENT_AMBULATORY_CARE_PROVIDER_SITE_OTHER): Payer: Medicaid Other | Admitting: General Practice

## 2022-03-22 VITALS — BP 121/79 | HR 80 | Ht 66.0 in | Wt 225.6 lb

## 2022-03-22 DIAGNOSIS — Z3042 Encounter for surveillance of injectable contraceptive: Secondary | ICD-10-CM

## 2022-03-22 LAB — POCT URINE PREGNANCY: Preg Test, Ur: NEGATIVE

## 2022-03-22 MED ORDER — MEDROXYPROGESTERONE ACETATE 150 MG/ML IM SUSP
150.0000 mg | INTRAMUSCULAR | Status: AC
  Administered 2022-03-22 – 2023-05-25 (×2): 150 mg via INTRAMUSCULAR

## 2022-03-22 NOTE — Progress Notes (Signed)
Date last pap: 12-21-21. Last Depo-Provera: Pt states last Depo was 12-21-21. Side Effects if any: Pt tolerated well. Serum HCG indicated? Negative UPT. Depo-Provera 150 mg IM given by: Arlie Solomons, CMA in the [place] per pt request. Next appointment due 1/16-1/30.   Pt was seen in office for AEX on 12-21-21. She states she is sure Depo was given at that visit but it was not documented. Due to lack of documentation, UPT was given in office today. UPT was negative, Depo given today per Mora Bellman, MD.

## 2022-03-31 DIAGNOSIS — F332 Major depressive disorder, recurrent severe without psychotic features: Secondary | ICD-10-CM | POA: Diagnosis not present

## 2022-03-31 DIAGNOSIS — F411 Generalized anxiety disorder: Secondary | ICD-10-CM | POA: Diagnosis not present

## 2022-05-26 DIAGNOSIS — F411 Generalized anxiety disorder: Secondary | ICD-10-CM | POA: Diagnosis not present

## 2022-05-26 DIAGNOSIS — F332 Major depressive disorder, recurrent severe without psychotic features: Secondary | ICD-10-CM | POA: Diagnosis not present

## 2022-06-13 ENCOUNTER — Ambulatory Visit (INDEPENDENT_AMBULATORY_CARE_PROVIDER_SITE_OTHER): Payer: Medicaid Other

## 2022-06-13 VITALS — BP 111/75 | HR 92 | Ht 66.0 in | Wt 227.0 lb

## 2022-06-13 DIAGNOSIS — Z30013 Encounter for initial prescription of injectable contraceptive: Secondary | ICD-10-CM

## 2022-06-13 DIAGNOSIS — Z3042 Encounter for surveillance of injectable contraceptive: Secondary | ICD-10-CM

## 2022-06-13 DIAGNOSIS — Z01419 Encounter for gynecological examination (general) (routine) without abnormal findings: Secondary | ICD-10-CM

## 2022-06-13 MED ORDER — MEDROXYPROGESTERONE ACETATE 150 MG/ML IM SUSP
150.0000 mg | Freq: Once | INTRAMUSCULAR | Status: AC
  Administered 2022-06-13: 150 mg via INTRAMUSCULAR

## 2022-06-13 MED ORDER — MEDROXYPROGESTERONE ACETATE 150 MG/ML IM SUSP: INTRAMUSCULAR | 3 refills | Status: DC

## 2022-06-13 NOTE — Progress Notes (Addendum)
SUBJECTIVE: Date last PAP: 12/21/2021. Last Depo-Provera: 03/22/2022. Side Effects if any: None.   Given in [place], tolerated well. Serum HCG indicated? NA.  PLAN: Depo-Provera 150 mg IM given by: Jasmine Awe,  CMA.  Pt tolerated well.  Next appointment due. Next DEPO Injection due April 9-23, 2024.   Administrations This Visit     medroxyPROGESTERone (DEPO-PROVERA) injection 150 mg     Admin Date 06/13/2022 Action Given Dose 150 mg Route Intramuscular Administered By Tamela Oddi, RMA

## 2022-07-22 DIAGNOSIS — F411 Generalized anxiety disorder: Secondary | ICD-10-CM | POA: Diagnosis not present

## 2022-07-22 DIAGNOSIS — F332 Major depressive disorder, recurrent severe without psychotic features: Secondary | ICD-10-CM | POA: Diagnosis not present

## 2022-08-15 DIAGNOSIS — U071 COVID-19: Secondary | ICD-10-CM | POA: Diagnosis not present

## 2022-08-15 DIAGNOSIS — J029 Acute pharyngitis, unspecified: Secondary | ICD-10-CM | POA: Diagnosis not present

## 2022-08-15 DIAGNOSIS — R509 Fever, unspecified: Secondary | ICD-10-CM | POA: Diagnosis not present

## 2022-08-15 DIAGNOSIS — M791 Myalgia, unspecified site: Secondary | ICD-10-CM | POA: Diagnosis not present

## 2022-08-15 DIAGNOSIS — R051 Acute cough: Secondary | ICD-10-CM | POA: Diagnosis not present

## 2022-09-02 DIAGNOSIS — F411 Generalized anxiety disorder: Secondary | ICD-10-CM | POA: Diagnosis not present

## 2022-09-02 DIAGNOSIS — F332 Major depressive disorder, recurrent severe without psychotic features: Secondary | ICD-10-CM | POA: Diagnosis not present

## 2022-09-05 ENCOUNTER — Ambulatory Visit (INDEPENDENT_AMBULATORY_CARE_PROVIDER_SITE_OTHER): Payer: Medicaid Other

## 2022-09-05 DIAGNOSIS — Z3042 Encounter for surveillance of injectable contraceptive: Secondary | ICD-10-CM

## 2022-09-05 MED ORDER — MEDROXYPROGESTERONE ACETATE 150 MG/ML IM SUSY
150.0000 mg | PREFILLED_SYRINGE | Freq: Once | INTRAMUSCULAR | Status: AC
  Administered 2022-09-05: 150 mg via INTRAMUSCULAR

## 2022-09-05 NOTE — Progress Notes (Addendum)
Date last pap: 12/21/21. Last Depo-Provera: 06/13/22. Side Effects if any: N/A. Serum HCG indicated? N/A. Depo-Provera 150 mg IM given by: Dreama Saa., RN. Injection given in RUOQ. Patient tolerated well. Next appointment due July 1- July 15.   Office supply given. Patient pharmacy was closed.

## 2022-10-20 DIAGNOSIS — F411 Generalized anxiety disorder: Secondary | ICD-10-CM | POA: Diagnosis not present

## 2022-10-20 DIAGNOSIS — F332 Major depressive disorder, recurrent severe without psychotic features: Secondary | ICD-10-CM | POA: Diagnosis not present

## 2022-11-07 ENCOUNTER — Ambulatory Visit: Payer: Medicaid Other | Admitting: Family Medicine

## 2022-11-29 ENCOUNTER — Ambulatory Visit: Payer: Medicaid Other

## 2022-12-08 ENCOUNTER — Ambulatory Visit: Payer: Medicaid Other

## 2022-12-13 ENCOUNTER — Ambulatory Visit: Payer: Medicaid Other | Admitting: Family Medicine

## 2022-12-14 ENCOUNTER — Ambulatory Visit (INDEPENDENT_AMBULATORY_CARE_PROVIDER_SITE_OTHER): Payer: Medicaid Other | Admitting: *Deleted

## 2022-12-14 DIAGNOSIS — Z3042 Encounter for surveillance of injectable contraceptive: Secondary | ICD-10-CM | POA: Diagnosis not present

## 2022-12-14 DIAGNOSIS — Z3202 Encounter for pregnancy test, result negative: Secondary | ICD-10-CM

## 2022-12-14 LAB — POCT URINE PREGNANCY: Preg Test, Ur: NEGATIVE

## 2022-12-14 MED ORDER — MEDROXYPROGESTERONE ACETATE 150 MG/ML IM SUSP
150.0000 mg | Freq: Once | INTRAMUSCULAR | Status: AC
Start: 2022-12-14 — End: 2022-12-14
  Administered 2022-12-14: 150 mg via INTRAMUSCULAR

## 2022-12-14 NOTE — Progress Notes (Signed)
Date last pap: 12/21/21. Last Depo-Provera: 09/05/22. Side Effects if any: NA. Serum HCG indicated? Neg. Depo-Provera 150 mg IM given by: Montez Morita, RN. Next appointment due 03/01/23-03/15/23.

## 2023-01-20 DIAGNOSIS — F332 Major depressive disorder, recurrent severe without psychotic features: Secondary | ICD-10-CM | POA: Diagnosis not present

## 2023-01-20 DIAGNOSIS — F411 Generalized anxiety disorder: Secondary | ICD-10-CM | POA: Diagnosis not present

## 2023-02-21 ENCOUNTER — Ambulatory Visit (INDEPENDENT_AMBULATORY_CARE_PROVIDER_SITE_OTHER): Payer: Medicaid Other | Admitting: Family Medicine

## 2023-02-21 ENCOUNTER — Encounter: Payer: Self-pay | Admitting: Family Medicine

## 2023-02-21 VITALS — BP 110/78 | HR 95 | Temp 98.3°F | Ht 66.5 in | Wt 237.1 lb

## 2023-02-21 DIAGNOSIS — L2089 Other atopic dermatitis: Secondary | ICD-10-CM | POA: Diagnosis not present

## 2023-02-21 DIAGNOSIS — R635 Abnormal weight gain: Secondary | ICD-10-CM | POA: Insufficient documentation

## 2023-02-21 DIAGNOSIS — G43109 Migraine with aura, not intractable, without status migrainosus: Secondary | ICD-10-CM

## 2023-02-21 DIAGNOSIS — R5383 Other fatigue: Secondary | ICD-10-CM | POA: Diagnosis not present

## 2023-02-21 LAB — COMPREHENSIVE METABOLIC PANEL
ALT: 16 U/L (ref 0–35)
AST: 15 U/L (ref 0–37)
Albumin: 4.2 g/dL (ref 3.5–5.2)
Alkaline Phosphatase: 121 U/L — ABNORMAL HIGH (ref 39–117)
BUN: 11 mg/dL (ref 6–23)
CO2: 25 meq/L (ref 19–32)
Calcium: 9.6 mg/dL (ref 8.4–10.5)
Chloride: 107 meq/L (ref 96–112)
Creatinine, Ser: 1.1 mg/dL (ref 0.40–1.20)
GFR: 66.62 mL/min (ref >60.00)
Glucose, Bld: 94 mg/dL (ref 70–99)
Potassium: 4.1 meq/L (ref 3.5–5.1)
Sodium: 141 meq/L (ref 135–145)
Total Bilirubin: 0.4 mg/dL (ref 0.2–1.2)
Total Protein: 7.9 g/dL (ref 6.0–8.3)

## 2023-02-21 LAB — HEMOGLOBIN A1C: Hgb A1c MFr Bld: 6.3 % (ref 4.6–6.5)

## 2023-02-21 LAB — CBC WITH DIFFERENTIAL/PLATELET
Basophils Absolute: 0 10*3/uL (ref 0.0–0.1)
Basophils Relative: 0.5 % (ref 0.0–3.0)
Eosinophils Absolute: 0.1 10*3/uL (ref 0.0–0.7)
Eosinophils Relative: 1.2 % (ref 0.0–5.0)
HCT: 38.4 % (ref 36.0–46.0)
Hemoglobin: 11.6 g/dL — ABNORMAL LOW (ref 12.0–15.0)
Lymphocytes Relative: 28.3 % (ref 12.0–46.0)
Lymphs Abs: 1.7 10*3/uL (ref 0.7–4.0)
MCHC: 30.3 g/dL (ref 30.0–36.0)
MCV: 74.5 fL — ABNORMAL LOW (ref 78.0–100.0)
Monocytes Absolute: 0.5 10*3/uL (ref 0.1–1.0)
Monocytes Relative: 7.3 % (ref 3.0–12.0)
Neutro Abs: 3.9 10*3/uL (ref 1.4–7.7)
Neutrophils Relative %: 62.7 % (ref 43.0–77.0)
Platelets: 372 10*3/uL (ref 150.0–400.0)
RBC: 5.15 Mil/uL — ABNORMAL HIGH (ref 3.87–5.11)
RDW: 15.8 % — ABNORMAL HIGH (ref 11.5–15.5)
WBC: 6.2 10*3/uL (ref 4.0–10.5)

## 2023-02-21 LAB — TSH: TSH: 2.03 u[IU]/mL (ref 0.35–5.50)

## 2023-02-21 MED ORDER — SUMATRIPTAN SUCCINATE 50 MG PO TABS
50.0000 mg | ORAL_TABLET | Freq: Every day | ORAL | 5 refills | Status: DC | PRN
Start: 2023-02-21 — End: 2023-08-09

## 2023-02-21 MED ORDER — FAMOTIDINE 20 MG PO TABS: 20.0000 mg | ORAL_TABLET | Freq: Two times a day (BID) | ORAL | 0 refills | Status: AC

## 2023-02-21 MED ORDER — TRIAMCINOLONE ACETONIDE 0.1 % EX CREA: 1.0000 | TOPICAL_CREAM | Freq: Two times a day (BID) | CUTANEOUS | 1 refills | Status: DC

## 2023-02-21 NOTE — Patient Instructions (Addendum)
Speak to your behavorial provider  about stopping the mirtazapine and possibly switching to effexor -- possibly if something is needed for depression/anxiety symptoms This medicine would help reduce frequency of migraines  Another possibility to consider Trazodone just for sleep issues  Another possibility to consider is Amitriptyline -- could be for sleep AND to reduce migraines  Probtiotics-- may take up to 3 times a day-- help

## 2023-02-21 NOTE — Assessment & Plan Note (Signed)
Will give patient a trial of sumatriptan to use for her acute migraines, we discussed changing her remeron to amitriptyline to help reduce headache frequency,. She will discuss this with her [facility] provider.

## 2023-02-21 NOTE — Progress Notes (Signed)
New Patient Office Visit  Subjective    Patient ID: Anne Baxter, female    DOB: 1991/03/23  Age: 32 y.o. MRN: [id number]  CC:  Chief Complaint  Patient presents with   Establish Care    HPI Anne Baxter presents to establish care Pt has not had a PCP in some time.   Patient is reporting a new symptoms of stomach issues that has been going on for a while. States that it has been going on for about 10 months or so. States that it randomly started about that time. Feels like what ever Anne Baxter is eating just sits in her stomach and doesn't "go down" also has random bouts of diarrhea as well, not related to diet. Lasts for a whole day when it occurs. Happens about once a month. Isn't having periods due to the depo injections so is not sure if it is related.  Pt has hx of migraines, thinks that they are getting worse/ more frequent, usually lasts a few hours, gets sensitivity to light and sound. Also reporting increasing fatigue, has been taking mirtazapine at bedtime for the last 2 years due to depression symptoms and difficulty sleeping. States Anne Baxter sees a [facility] provider who is prescribing the medication for her. States that Anne Baxter is much better, denies any depression symptoms at this time, has a good job and her home situation is stable.   Also reporting a rash on her lower back and wrists. States that it comes and goes, states that when Anne Baxter initally got it it was all over her body, states that Anne Baxter was told it was something that started with an R, was given prednisone and the rahs impoved. Now Anne Baxter gets occasional breakouts of the rash on her lwoer back, wrists and ocasoinlyl her legs.   Current Outpatient Medications  Medication Instructions   famotidine (PEPCID) 20 mg, Oral, 2 times daily   medroxyPROGESTERone (DEPO-PROVERA) 150 MG/ML injection ADMINISTER 1 ML(150 MG) IN THE MUSCLE EVERY 3 MONTHS   mirtazapine (REMERON) 15 mg, Oral, Daily at bedtime   SUMAtriptan (IMITREX) 50 mg, Oral, Daily  PRN, May repeat in 2 hours if headache persists or recurs.   triamcinolone cream (KENALOG) 0.1 % 1 Application, Topical, 2 times daily    Past Medical History:  Diagnosis Date   Anemia 2010   FE   Anxiety    Depression    Headache    Infection 02/2017   CHLAMYDIA   Insomnia    Migraine    Pregnant    Syncope    Wrist fracture 2009 X2    Past Surgical History:  Procedure Laterality Date   NO PAST SURGERIES      Family History  Problem Relation Age of Onset   Kidney disease Mother        FAILURE   Heart disease Mother        CHF   Early death Mother 86   Hypertension Mother    Thrombophlebitis Mother    Hyperlipidemia Maternal Grandmother    Hypertension Maternal Grandmother     Social History   Socioeconomic History   Marital status: Single    Spouse name: Not on file   Number of children: 1   Years of education: 15   Highest education level: Not on file  Occupational History   Occupation: CUSTOMER SERVICE  Tobacco Use   Smoking status: Never   Smokeless tobacco: Never  Vaping Use   Vaping status: Never Used  Substance and  Sexual Activity   Alcohol use: Yes    Comment: socially   Drug use: No   Sexual activity: Not Currently    Partners: Male    Birth control/protection: Injection  Other Topics Concern   Not on file  Social History Narrative   Lives at home with boyfriend and daughter.   Right-handed.   No caffeine use.   Social Determinants of Health   Financial Resource Strain: Not on file  Food Insecurity: Not on file  Transportation Needs: Not on file  Physical Activity: Not on file  Stress: Not on file  Social Connections: Not on file  Intimate Partner Violence: Not on file    Review of Systems  All other systems reviewed and are negative.       Objective    BP 110/78 (BP Location: Left Arm, Patient Position: Sitting, Cuff Size: Large)   Pulse 95   Temp 98.3 F (36.8 C) (Oral)   Ht 5' 6.5" (1.689 m)   Wt 237 lb 1.6 oz  (107.5 kg)   SpO2 96%   BMI 37.70 kg/m   Physical Exam Vitals reviewed.  Constitutional:      Appearance: Normal appearance. Anne Baxter is well-groomed. Anne Baxter is obese.  Eyes:     Conjunctiva/sclera: Conjunctivae normal.  Neck:     Thyroid: No thyromegaly.  Cardiovascular:     Rate and Rhythm: Normal rate and regular rhythm.     Pulses: Normal pulses.     Heart sounds: S1 normal and S2 normal.  Pulmonary:     Effort: Pulmonary effort is normal.     Breath sounds: Normal breath sounds and air entry.  Abdominal:     General: Bowel sounds are normal.  Musculoskeletal:     Right lower leg: No edema.     Left lower leg: No edema.  Skin:    Findings: Rash (very small hyperpigmented round lesions on the lower back that have some overlying dry flaky skin) present.  Neurological:     Mental Status: Anne Baxter is alert and oriented to [place], place, and time. Mental status is at baseline.     Gait: Gait is intact.  Psychiatric:        Mood and Affect: Mood and affect normal.        Speech: Speech normal.        Behavior: Behavior normal.        Judgment: Judgment normal.         Assessment & Plan:  Migraine with aura and without status migrainosus, not intractable Assessment & Plan: Will give patient a trial of sumatriptan to use for her acute migraines, we discussed changing her remeron to amitriptyline to help reduce headache frequency,. Anne Baxter will discuss this with her [facility] provider.  Orders: -     SUMAtriptan Succinate; Take 1 tablet (50 mg total) by mouth daily as needed for migraine. May repeat in 2 hours if headache persists or recurs.  Dispense: 10 tablet; Refill: 5  Other fatigue Assessment & Plan: Pt is experiencing a large amount of weight gain since being on the remeron, 16 pounds of weight in the last year. I will order CMP, A1C, TSH and CBC to assess. I advised that getting her off the remeron would be a big help in reducing/ reversing the weight gain and finding a different  medication for her chronic insomnia would also help. OSA also becomes a possibiltiy due to the weight gain....  Orders: -     CBC with Differential/Platelet -  TSH  Weight gain Assessment & Plan: See above plan  Orders: -     Comprehensive metabolic panel -     Hemoglobin A1c  Other atopic dermatitis Assessment & Plan: Pt reports it is very itchy, states that the rash on her lower back is not nearly as sever as the initial outbreak. I will giver her triamcinolone cream to use PRN, I advised that when Anne Baxter gets an outbreak Anne Baxter needs to be seen and possibly get a biopsy -- could be inflammatory hives but it is not clear at this time  Orders: -     Triamcinolone Acetonide; Apply 1 Application topically 2 (two) times daily.  Dispense: 30 g; Refill: 1  Other orders -     Famotidine; Take 1 tablet (20 mg total) by mouth 2 (two) times daily for 14 days.  Dispense: 28 tablet; Refill: 0   I spent 45 minutes with the patient today reviewing her symptoms and discussing her issues, counseling on sleep hygiene and weight gain No follow-ups on file.   Karie Georges, MD

## 2023-02-21 NOTE — Assessment & Plan Note (Signed)
Pt is experiencing a large amount of weight gain since being on the remeron, 16 pounds of weight in the last year. I will order CMP, A1C, TSH and CBC to assess. I advised that getting her off the remeron would be a big help in reducing/ reversing the weight gain and finding a different medication for her chronic insomnia would also help. OSA also becomes a possibiltiy due to the weight gain.Marland KitchenMarland KitchenMarland Kitchen

## 2023-02-21 NOTE — Assessment & Plan Note (Signed)
See above plan. 

## 2023-02-21 NOTE — Assessment & Plan Note (Signed)
Pt reports it is very itchy, states that the rash on her lower back is not nearly as sever as the initial outbreak. I will giver her triamcinolone cream to use PRN, I advised that when she gets an outbreak she needs to be seen and possibly get a biopsy -- could be inflammatory hives but it is not clear at this time

## 2023-02-24 ENCOUNTER — Telehealth (INDEPENDENT_AMBULATORY_CARE_PROVIDER_SITE_OTHER): Payer: Medicaid Other | Admitting: Family Medicine

## 2023-02-24 ENCOUNTER — Encounter: Payer: Self-pay | Admitting: Family Medicine

## 2023-02-24 DIAGNOSIS — R7303 Prediabetes: Secondary | ICD-10-CM | POA: Insufficient documentation

## 2023-02-24 DIAGNOSIS — E66812 Obesity, class 2: Secondary | ICD-10-CM | POA: Diagnosis not present

## 2023-02-24 DIAGNOSIS — Z6837 Body mass index (BMI) 37.0-37.9, adult: Secondary | ICD-10-CM | POA: Diagnosis not present

## 2023-02-24 MED ORDER — WEGOVY 0.25 MG/0.5ML ~~LOC~~ SOAJ: 0.2500 mg | SUBCUTANEOUS | 0 refills | Status: DC

## 2023-02-24 NOTE — Progress Notes (Signed)
Patient unable to check vital signs at home for virtual visit.

## 2023-02-24 NOTE — Assessment & Plan Note (Signed)
NEW diagnosis. I discussed reducing sugar and starches in her diet, I have given her parameters to help achieve this. Will check A1C in 3 months.

## 2023-02-24 NOTE — Assessment & Plan Note (Signed)
Patient's Comorbid condition is prediabetes. She has been placed on a new dietary plan and she will continue her exercising 3-4 times a week that she has already started.  The goals I have given her are listed below:  Total Calories per day: 2000  Total Carbs per day: less than 100 grams  Total Protein per day: More than 100 grams  Fiber: 35 grams  Pt is an excellent candidate for [facility] treatment. I will call this in and begin the PA process. RTC every 3 months to track her progress. Risks/benefits of the medication were discussed in depth.

## 2023-02-24 NOTE — Patient Instructions (Signed)
Total Calories per day: 2000  Total Carbs per day: less than 100 grams  Total Protein per day: More than 100 grams  Fiber: 35 grams  Multivitamin once daily  Apps that will help track your progress: Loseit!, Carb Manager, Myfitnesspal

## 2023-02-24 NOTE — Progress Notes (Signed)
Virtual Medical Office Visit  Patient:  Anne Baxter      Age: 32 y.o.       Sex:  female  Date:   02/24/2023  PCP:    Karie Georges, MD   Today's Healthcare Provider: Karie Georges, MD    Assessment/Plan:   Summary assessment:  Anne Baxter was seen today for results.  Class 2 severe obesity with serious comorbidity and body mass index (BMI) of 37.0 to 37.9 in adult, unspecified obesity type [facility]) Assessment & Plan: Patient's Comorbid condition is prediabetes. She has been placed on a new dietary plan and she will continue her exercising 3-4 times a week that she has already started.  The goals I have given her are listed below:  Total Calories per day: 2000  Total Carbs per day: less than 100 grams  Total Protein per day: More than 100 grams  Fiber: 35 grams  Pt is an excellent candidate for [facility] treatment. I will call this in and begin the PA process. RTC every 3 months to track her progress. Risks/benefits of the medication were discussed in depth.   Orders: -     Wegovy; Inject 0.25 mg into the skin once a week.  Dispense: 2 mL; Refill: 0  Prediabetes Assessment & Plan: NEW diagnosis. I discussed reducing sugar and starches in her diet, I have given her parameters to help achieve this. Will check A1C in 3 months.       No follow-ups on file.   She was advised to call the office or go to ER if her condition worsens    Subjective:   Anne Baxter is a 32 y.o. female with PMH significant for: Past Medical History:  Diagnosis Date   Anemia 2010   FE   Anxiety    Depression    Headache    Infection 02/2017   CHLAMYDIA   Insomnia    Migraine    Pregnant    Syncope    Wrist fracture 2009 X2     Presenting today with: Chief Complaint  Patient presents with   Results    Patient presents for a virtual visit to discuss lab test results     She clarifies and reports that her condition: Patient is here on this video visit to discuss her blood  work. She had an A1C of 6.3 which is prediabetes. We had a discussion about medications and we previously discussed stopping the mirtazapine to reduce the weight gain. Pt will discuss with her [facility] provider. I have had an extensive 30 minute conversation today with the patient about healthy eating habits, exercise, calorie and carb goals for sustainable and successful weight loss. I gave the patient caloric and protein daily intake values as well as described the importance of increasing fiber and water intake. I discussed weight loss medications that could be used in the treatment of this patient. I feel that she would be an excellent candidate for [facility] therapy, especially because her Co morbid condition is Prediabetes.            Objective/Observations  Physical Exam:  Polite and friendly Gen: NAD, resting comfortably Pulm: Normal work of breathing Neuro: Grossly normal, moves all extremities Psych: Normal affect and thought content Problem specific physical exam findings: Morbidly obesie   No images are attached to the encounter or orders placed in the encounter.    Results: No results found for any visits on 02/24/23.  Virtual Visit via Video   I connected with Anne Baxter on 02/24/23 at 10:00 AM EDT by a video enabled telemedicine application and verified that I am speaking with the correct [name] using two identifiers. The limitations of evaluation and management by telemedicine and the availability of in [place] appointments were discussed. The patient expressed understanding and agreed to proceed.   Percentage of appointment time on video:  100% Patient location: Home Provider location: [place] [facility] Persons participating in the virtual visit: Myself and Patient

## 2023-02-27 ENCOUNTER — Telehealth: Payer: Self-pay

## 2023-02-27 ENCOUNTER — Other Ambulatory Visit (HOSPITAL_COMMUNITY): Payer: Self-pay

## 2023-02-27 NOTE — Telephone Encounter (Signed)
Pharmacy Patient Advocate Encounter   Received notification from Physician's Office that prior authorization for [facility] is required/requested.   Insurance verification completed.   The patient is insured through  [employer]  .   Per test claim: PA required; PA submitted to [employer] via CoverMyMeds Key/confirmation #/[id number] (Key: [id number])  Status is pending

## 2023-02-28 ENCOUNTER — Other Ambulatory Visit (HOSPITAL_COMMUNITY): Payer: Self-pay

## 2023-02-28 NOTE — Telephone Encounter (Signed)
Spoke with Mel Almond at [facility] and informed her of the approval as below.  She stated the Rx is on back order and the patient will be contacted once this is ready for pick up.

## 2023-02-28 NOTE — Telephone Encounter (Signed)
Pharmacy Patient Advocate Encounter  Received notification from   [employer]  that Prior Authorization for Wegovy0.25MG  has been APPROVED from 10.7.24 to 4.725. Ran test claim, Copay is $4.00. This test claim was processed through [facility]- copay amounts may vary at other pharmacies due to pharmacy/plan contracts, or as the patient moves through the different stages of their insurance plan.   PA #/Case ID/Reference #: [id number]

## 2023-03-01 ENCOUNTER — Ambulatory Visit: Payer: Medicaid Other | Admitting: Emergency Medicine

## 2023-03-01 VITALS — BP 120/81 | HR 93 | Wt 236.0 lb

## 2023-03-01 DIAGNOSIS — Z3042 Encounter for surveillance of injectable contraceptive: Secondary | ICD-10-CM

## 2023-03-01 DIAGNOSIS — Z113 Encounter for screening for infections with a predominantly sexual mode of transmission: Secondary | ICD-10-CM | POA: Diagnosis not present

## 2023-03-01 MED ORDER — MEDROXYPROGESTERONE ACETATE 150 MG/ML IM SUSP
150.0000 mg | Freq: Once | INTRAMUSCULAR | Status: AC
Start: 2023-03-01 — End: 2023-03-01
  Administered 2023-03-01: 150 mg via INTRAMUSCULAR

## 2023-03-01 NOTE — Progress Notes (Signed)
Date last pap: 12/21/2021. Last Depo-Provera: 12/14/22. Side Effects if any: NA. Serum HCG indicated? NA. Depo-Provera 150 mg IM given by: Resa Miner, RN. Next appointment due: Dec 25- Jan 5

## 2023-03-02 LAB — HEPATITIS B SURFACE ANTIGEN: Hepatitis B Surface Ag: NEGATIVE

## 2023-03-02 LAB — RPR: RPR Ser Ql: NONREACTIVE

## 2023-03-02 LAB — HIV ANTIBODY (ROUTINE TESTING W REFLEX): HIV Screen 4th Generation wRfx: NONREACTIVE

## 2023-03-02 LAB — HEPATITIS C ANTIBODY: Hep C Virus Ab: NONREACTIVE

## 2023-04-14 DIAGNOSIS — F332 Major depressive disorder, recurrent severe without psychotic features: Secondary | ICD-10-CM | POA: Diagnosis not present

## 2023-04-14 DIAGNOSIS — F411 Generalized anxiety disorder: Secondary | ICD-10-CM | POA: Diagnosis not present

## 2023-05-25 ENCOUNTER — Ambulatory Visit: Payer: Medicaid Other

## 2023-05-25 VITALS — BP 125/84 | HR 90 | Wt 235.4 lb

## 2023-05-25 DIAGNOSIS — Z3042 Encounter for surveillance of injectable contraceptive: Secondary | ICD-10-CM | POA: Diagnosis not present

## 2023-05-25 NOTE — Progress Notes (Addendum)
 Pt is in the office for depo injection.  Administered in RUOQ, per pt preference, and pt tolerated well. Next due March 20-April 3 .. Administrations This Visit     medroxyPROGESTERone  (DEPO-PROVERA ) injection 150 mg     Admin Date 05/25/2023 Action Given Dose 150 mg Route Intramuscular Documented By Doneta Laymon BIRCH, RN

## 2023-05-31 ENCOUNTER — Ambulatory Visit: Payer: Medicaid Other | Admitting: Family Medicine

## 2023-06-02 ENCOUNTER — Ambulatory Visit: Payer: Medicaid Other | Admitting: Family Medicine

## 2023-06-22 ENCOUNTER — Ambulatory Visit: Payer: Medicaid Other | Admitting: Family Medicine

## 2023-07-11 ENCOUNTER — Telehealth: Payer: Self-pay | Admitting: Family Medicine

## 2023-07-11 DIAGNOSIS — F332 Major depressive disorder, recurrent severe without psychotic features: Secondary | ICD-10-CM | POA: Diagnosis not present

## 2023-07-11 DIAGNOSIS — F411 Generalized anxiety disorder: Secondary | ICD-10-CM | POA: Diagnosis not present

## 2023-07-11 NOTE — Telephone Encounter (Signed)
Called pt and left vm. Provider will be working remote on 2/19, pt's appt needs to be changed to a virtual appt or rescheduled to another date.

## 2023-07-12 ENCOUNTER — Ambulatory Visit: Payer: Medicaid Other | Admitting: Family Medicine

## 2023-07-28 ENCOUNTER — Ambulatory Visit: Payer: Medicaid Other | Admitting: Family Medicine

## 2023-08-09 ENCOUNTER — Other Ambulatory Visit (HOSPITAL_BASED_OUTPATIENT_CLINIC_OR_DEPARTMENT_OTHER): Payer: Self-pay

## 2023-08-09 ENCOUNTER — Ambulatory Visit (INDEPENDENT_AMBULATORY_CARE_PROVIDER_SITE_OTHER): Admitting: Family Medicine

## 2023-08-09 VITALS — BP 108/80 | HR 95 | Temp 98.3°F | Ht 66.5 in | Wt 230.0 lb

## 2023-08-09 DIAGNOSIS — G43109 Migraine with aura, not intractable, without status migrainosus: Secondary | ICD-10-CM

## 2023-08-09 DIAGNOSIS — L2089 Other atopic dermatitis: Secondary | ICD-10-CM

## 2023-08-09 DIAGNOSIS — R7303 Prediabetes: Secondary | ICD-10-CM | POA: Diagnosis not present

## 2023-08-09 DIAGNOSIS — E66812 Obesity, class 2: Secondary | ICD-10-CM

## 2023-08-09 DIAGNOSIS — Z6837 Body mass index (BMI) 37.0-37.9, adult: Secondary | ICD-10-CM

## 2023-08-09 LAB — POCT GLYCOSYLATED HEMOGLOBIN (HGB A1C): Hemoglobin A1C: 6 % — AB (ref 4.0–5.6)

## 2023-08-09 MED ORDER — WEGOVY 0.25 MG/0.5ML ~~LOC~~ SOAJ
0.2500 mg | SUBCUTANEOUS | 0 refills | Status: AC
  Filled 2023-08-09 – 2023-08-21 (×2): qty 2, 28d supply, fill #0

## 2023-08-09 MED ORDER — WEGOVY 0.5 MG/0.5ML ~~LOC~~ SOAJ
0.5000 mg | SUBCUTANEOUS | 0 refills | Status: AC
  Filled 2023-08-09: qty 2, fill #0

## 2023-08-09 MED ORDER — TOPIRAMATE 50 MG PO TABS
ORAL_TABLET | ORAL | 1 refills | Status: AC
Start: 2023-08-09 — End: 2023-09-20

## 2023-08-09 MED ORDER — CLOBETASOL PROPIONATE 0.05 % EX OINT
1.0000 | TOPICAL_OINTMENT | Freq: Every day | CUTANEOUS | 2 refills | Status: AC
Start: 2023-08-09 — End: ?
  Filled 2023-08-09: qty 15, 14d supply, fill #0

## 2023-08-09 NOTE — Assessment & Plan Note (Signed)
 Was unable to get wegovy due to being on "back order" her A1C is improved, down to 6.0 and she has lost 5 pounds with diet and exercise. We will continue the current plan and re-order the wegovy to see if a different pharmacy has it available. RTC in 3 months

## 2023-08-09 NOTE — Progress Notes (Signed)
 Established Patient Office Visit  Subjective   Patient ID: Anne Baxter, female    DOB: [birth year]  Age: 33 y.o. MRN: [id number]  Chief Complaint  Patient presents with   Medical Management of Chronic Issues    Patient states she never started [facility] as she was told Rx was on back order   Medication Problem    Patient states she discontinued Rx for Triamcinolone as this "burned her skin"    Patient is here for follow up today.  Obesity-- pt states she never able to get the medication for this because the [facility] told her it was on back order. States she asked them every month and they told her "they would call her when it was available." Pt has been doing the dietary plan that I gave her previously, has been exercising regularly. Actually has lost 5 pounds since her last visit which is very good without the medication. She is continuing to make the lifestyle changes and staying away from high sugar/ highly processed foods. The diet plan I gave her is listed below:   Total Calories per day: 2000   Total Carbs per day: less than 100 grams   Total Protein per day: More than 100 grams   Fiber: 35 grams  Chronic migraines-- patient reports that the sumatriptan did not work at all to help with her migraines, had side effects of nausea, cold chills, with the sumatriptan. States that she stopped taking it due to the side effects. Reports that she is taking excedrin migraine which helps but is still having them every other day. States she is feeling very tired.    Current Outpatient Medications  Medication Instructions   clobetasol ointment (TEMOVATE) 0.05 % 1 Application, Topical, Daily   famotidine (PEPCID) 20 mg, Oral, 2 times daily   medroxyPROGESTERone (DEPO-PROVERA) 150 MG/ML injection ADMINISTER 1 ML(150 MG) IN THE MUSCLE EVERY 3 MONTHS   mirtazapine (REMERON) 15 mg, Daily at bedtime   topiramate (TOPAMAX) 50 MG tablet Take 0.5 tablets (25 mg total) by mouth at bedtime for 7  days, THEN 0.5 tablets (25 mg total) 2 (two) times daily for 7 days, THEN 1 tablet (50 mg total) 2 (two) times daily for 28 days.   Wegovy 0.25 mg, Subcutaneous, Weekly   [START ON 09/06/2023] Wegovy 0.5 mg, Subcutaneous, Weekly    Patient Active Problem List   Diagnosis Date Noted   Class 2 severe obesity with serious comorbidity and body mass index (BMI) of 37.0 to 37.9 in adult [facility]) 02/24/2023   Prediabetes 02/24/2023   Other fatigue 02/21/2023   Weight gain 02/21/2023   Other atopic dermatitis 02/21/2023   Visit for routine gyn exam 12/21/2021   Encounter for surveillance of injectable contraceptive 12/21/2021   Exposure to STD 12/21/2021   Migraine 08/22/2019   MDD (major depressive disorder) 11/30/2017   Severe recurrent major depression without psychotic features (HCC) 08/07/2017   Depression 09/20/2016   History of migraine headaches 03/16/2016      Review of Systems  All other systems reviewed and are negative.     Objective:     BP 108/80   Pulse 95   Temp 98.3 F (36.8 C) (Oral)   Ht 5' 6.5" (1.689 m)   Wt 230 lb (104.3 kg)   SpO2 100%   BMI 36.57 kg/m    Physical Exam Vitals reviewed.  Constitutional:      Appearance: Normal appearance. She is well-groomed. She is obese.  Eyes:  Conjunctiva/sclera: Conjunctivae normal.  Neck:     Thyroid: No thyromegaly.  Cardiovascular:     Rate and Rhythm: Normal rate and regular rhythm.     Heart sounds: S1 normal and S2 normal.  Pulmonary:     Effort: Pulmonary effort is normal.     Breath sounds: Normal breath sounds and air entry.  Musculoskeletal:     Right lower leg: No edema.     Left lower leg: No edema.  Skin:    Findings: Rash (very small hyperpigmented round lesions on the lower back) present.  Neurological:     Mental Status: She is alert and oriented to [place], place, and time. Mental status is at baseline.     Gait: Gait is intact.  Psychiatric:        Mood and Affect: Mood and affect  normal.        Speech: Speech normal.        Behavior: Behavior normal.        Judgment: Judgment normal.      Results for orders placed or performed in visit on 08/09/23  POC HgB A1c  Result Value Ref Range   Hemoglobin A1C 6.0 (A) 4.0 - 5.6 %   HbA1c POC (<> result, manual entry)     HbA1c, POC (prediabetic range)     HbA1c, POC (controlled diabetic range)        The ASCVD Risk score (Arnett DK, et al., 2019) failed to calculate for the following reasons:   The 2019 ASCVD risk score is only valid for ages 3 to 25    Assessment & Plan:  Other atopic dermatitis Assessment & Plan: The rash is improved but the hyperpigmentation is still there, it is most likely postinflammatory hyperpigmentation, will switch her to clobetasol ointment to see if this will help lighten the areas over time.   Orders: -     Clobetasol Propionate; Apply 1 Application topically daily.  Dispense: 30 g; Refill: 2  Class 2 severe obesity with serious comorbidity and body mass index (BMI) of 37.0 to 37.9 in adult, unspecified obesity type [facility]) Assessment & Plan: Was unable to get wegovy due to being on "back order" her A1C is improved, down to 6.0 and she has lost 5 pounds with diet and exercise. We will continue the current plan and re-order the wegovy to see if a different pharmacy has it available. RTC in 3 months  Orders: -     Wegovy; Inject 0.25 mg into the skin once a week.  Dispense: 2 mL; Refill: 0 -     Wegovy; Inject 0.5 mg into the skin once a week.  Dispense: 2 mL; Refill: 0  Prediabetes Assessment & Plan: A1C improved since using dietary changes, will continue to monitor every 6 months.   Orders: -     POCT glycosylated hemoglobin (Hb A1C) -     Collection capillary blood specimen  Migraine with aura and without status migrainosus, not intractable Assessment & Plan: Failed sumatriptan, I gave the patient samples of nurtec 75 mg PRN acute migraine and we discussed options including  a daily preventative-- will start topiramate and titrate up to 50 mg BID.   Orders: -     Topiramate; Take 0.5 tablets (25 mg total) by mouth at bedtime for 7 days, THEN 0.5 tablets (25 mg total) 2 (two) times daily for 7 days, THEN 1 tablet (50 mg total) 2 (two) times daily for 28 days.  Dispense: 180 tablet; Refill: 1  Return in about 3 months (around 11/09/2023) for annual physical exam.    Karie Georges, MD

## 2023-08-09 NOTE — Assessment & Plan Note (Signed)
 A1C improved since using dietary changes, will continue to monitor every 6 months.

## 2023-08-09 NOTE — Assessment & Plan Note (Signed)
 The rash is improved but the hyperpigmentation is still there, it is most likely postinflammatory hyperpigmentation, will switch her to clobetasol ointment to see if this will help lighten the areas over time.

## 2023-08-09 NOTE — Patient Instructions (Signed)
 Iron -- at least 60 mg elemental iron- or look ferrous sulfate -- 325 mg tablet once daily  B complex vitamins -- B 2, 6 and 12

## 2023-08-09 NOTE — Assessment & Plan Note (Signed)
 Failed sumatriptan, I gave the patient samples of nurtec 75 mg PRN acute migraine and we discussed options including a daily preventative-- will start topiramate and titrate up to 50 mg BID.

## 2023-08-17 ENCOUNTER — Ambulatory Visit: Payer: Medicaid Other | Admitting: Obstetrics and Gynecology

## 2023-08-17 ENCOUNTER — Ambulatory Visit: Payer: Medicaid Other

## 2023-08-19 ENCOUNTER — Other Ambulatory Visit (HOSPITAL_BASED_OUTPATIENT_CLINIC_OR_DEPARTMENT_OTHER): Payer: Self-pay

## 2023-08-21 ENCOUNTER — Other Ambulatory Visit: Payer: Self-pay

## 2023-08-21 ENCOUNTER — Ambulatory Visit

## 2023-08-21 ENCOUNTER — Other Ambulatory Visit (HOSPITAL_BASED_OUTPATIENT_CLINIC_OR_DEPARTMENT_OTHER): Payer: Self-pay

## 2023-08-21 VITALS — BP 123/83 | HR 78

## 2023-08-21 DIAGNOSIS — Z3042 Encounter for surveillance of injectable contraceptive: Secondary | ICD-10-CM

## 2023-08-21 MED ORDER — MEDROXYPROGESTERONE ACETATE 150 MG/ML IM SUSP: INTRAMUSCULAR | 0 refills | Status: DC

## 2023-08-21 MED ORDER — MEDROXYPROGESTERONE ACETATE 150 MG/ML IM SUSP
150.0000 mg | Freq: Once | INTRAMUSCULAR | Status: AC
  Administered 2023-08-21: 150 mg via INTRAMUSCULAR

## 2023-08-21 NOTE — Progress Notes (Signed)
 Refill of depo sent in to pharmacy, informed needs AEX for more refills

## 2023-08-21 NOTE — Progress Notes (Signed)
 Date last pap: 12/21/21. Last Depo-Provera: 05/25/23. Side Effects if any: NA. Serum HCG indicated? NA. Depo-Provera 150 mg IM given by: Karma Ganja, RN. Next appointment due June 16-30 2025.  Office supply used due to pharmacy not being able to fill medication in time, and pt had to do appt today due to insurance running out tomorrow.

## 2023-08-22 ENCOUNTER — Ambulatory Visit

## 2023-09-13 ENCOUNTER — Encounter: Payer: Self-pay | Admitting: Family Medicine

## 2023-09-20 ENCOUNTER — Ambulatory Visit: Admitting: Obstetrics and Gynecology

## 2023-09-29 ENCOUNTER — Encounter: Payer: Self-pay | Admitting: Obstetrics and Gynecology

## 2023-09-29 ENCOUNTER — Ambulatory Visit: Payer: Self-pay | Admitting: Obstetrics and Gynecology

## 2023-09-29 VITALS — BP 149/89 | HR 109 | Ht 66.0 in | Wt 228.0 lb

## 2023-09-29 DIAGNOSIS — M533 Sacrococcygeal disorders, not elsewhere classified: Secondary | ICD-10-CM

## 2023-09-29 DIAGNOSIS — R102 Pelvic and perineal pain: Secondary | ICD-10-CM | POA: Diagnosis not present

## 2023-09-29 DIAGNOSIS — W109XXA Fall (on) (from) unspecified stairs and steps, initial encounter: Secondary | ICD-10-CM | POA: Diagnosis not present

## 2023-09-29 MED ORDER — TIZANIDINE HCL 2 MG PO TABS
2.0000 mg | ORAL_TABLET | Freq: Three times a day (TID) | ORAL | 0 refills | Status: AC | PRN
Start: 2023-09-29 — End: ?

## 2023-09-29 NOTE — Progress Notes (Signed)
 GYNECOLOGY VISIT  Discussed the use of AI scribe software for clinical note transcription with the patient, who gave verbal consent to proceed.  Chief Complaint:   Pelvic Pain  History:  Anne Baxter is a 33 y.o. [id number] being seen today for vaginal/pelvic pain after fall.   History of Present Illness Anne Baxter is a 33 year old female who presents with pelvic pain following a fall.  She experienced significant soreness in the vaginal and perineal area following a fall approximately one week ago. Initially, there was soreness in the buttocks, but over the subsequent days, the pain extended to the vaginal area, making it difficult for her to wipe or use a bidet without discomfort. The pain is exacerbated by laughing, and she feels that 'something isn't right.'  The fall occurred when she missed a step while distracted on a school morning, causing her to fall down five or six steps and land hard on her bottom. She did not lose consciousness during the fall. She has not engaged in any activities that might otherwise explain the pain, such as intercourse.  She has not taken any medication for the pain and describes herself as 'suffering in peace.' She fears defecating due to the pain and is currently experiencing discomfort while sitting. She has not used any muscle relaxers before and has no known allergies. No pain in the legs.   Past Medical History:  Diagnosis Date   Anemia 2010   FE   Anxiety    Depression    Headache    Infection 02/2017   CHLAMYDIA   Insomnia    Migraine    Pregnant    Syncope    Wrist fracture 2009 X2    Past Surgical History:  Procedure Laterality Date   NO PAST SURGERIES      The following portions of the patient's history were reviewed and updated as appropriate: allergies, current medications, past family history, past medical history, past social history, past surgical history and problem list.   Health Maintenance:   Last pap      Component Value Date/Time   DIAGPAP  12/21/2021 0841    - Negative for intraepithelial lesion or malignancy (NILM)   DIAGPAP (A) 01/03/2019 0000    LOW GRADE SQUAMOUS INTRAEPITHELIAL LESION: CIN-1/ HPV (LSIL).   HPVHIGH Negative 12/21/2021 0841   ADEQPAP  12/21/2021 0841    Satisfactory for evaluation; transformation zone component PRESENT.   ADEQPAP (A) 01/03/2019 0000    Satisfactory for evaluation  endocervical/transformation zone component PRESENT.    High Risk HPV: Positive  Adequacy:  Satisfactory for evaluation, transformation zone component PRESENT  Diagnosis:  Atypical squamous cells of undetermined significance (ASC-US )  Last mammogram: n/a   Review of Systems:  Pertinent items are noted in HPI. Comprehensive review of systems was otherwise negative.   Objective:  Physical Exam BP (!) 149/89   Pulse (!) 109   Ht 5\' 6"  (1.676 m)   Wt 228 lb (103.4 kg)   BMI 36.80 kg/m    Physical Exam Vitals and nursing note reviewed. Exam conducted with a chaperone present.  Constitutional:      Appearance: Normal appearance.  HENT:     Head: Normocephalic and atraumatic.  Pulmonary:     Effort: Pulmonary effort is normal.  Genitourinary:    General: Normal vulva.     Comments: Nontender superficial pelvic floor muscles Tender pelvic floor muscles bilaterally, right > left Tenderness at perineum Sacral tenderness with palpation approaching  coccyx Skin:    General: Skin is warm and dry.  Neurological:     General: No focal deficit present.     Mental Status: She is alert.  Psychiatric:        Mood and Affect: Mood normal.        Behavior: Behavior normal.        Thought Content: Thought content normal.        Judgment: Judgment normal.       Assessment & Plan:   Assessment & Plan Coccyx pain post-fall Coccyx pain likely due to injury from fall. Possible fracture or contusion. Imaging required to confirm diagnosis. - Order pelvic x-ray to assess coccyx for  fracture or other abnormalities.  Pelvic muscle pain Pelvic muscle pain likely due to fall, indicating muscle strain or contusion. - Trial muscle relaxer to alleviate muscle pain along  - Recommend Tylenol  or ibuprofen  for pain management in addition to muscle relaxer - Advise use of a donut cushion when sitting to reduce pressure on the pelvic area.  Routine preventative health maintenance measures emphasized.  Kiki Pelton, MD [facility], [facility]

## 2023-09-29 NOTE — Patient Instructions (Addendum)
 Can take muscle relaxer up to 3 times a day. May make you drowsy. Can take 600mg  of ibuprofen  every 8 hours and/or tylenol  1000mg  every 6-8 hours   Have a pelvic xray completed to look at your tailbone

## 2023-09-29 NOTE — Progress Notes (Signed)
 Pt reports pain since falling down the stair 1 weeks ago. Pt is abstinent

## 2023-10-02 ENCOUNTER — Ambulatory Visit (HOSPITAL_COMMUNITY)
Admission: RE | Admit: 2023-10-02 | Discharge: 2023-10-02 | Disposition: A | Discharge status: Home / Self Care | Source: Ambulatory Visit | Attending: Obstetrics and Gynecology | Admitting: Obstetrics and Gynecology

## 2023-10-02 DIAGNOSIS — M533 Sacrococcygeal disorders, not elsewhere classified: Secondary | ICD-10-CM | POA: Diagnosis not present

## 2023-10-02 DIAGNOSIS — W109XXA Fall (on) (from) unspecified stairs and steps, initial encounter: Secondary | ICD-10-CM | POA: Insufficient documentation

## 2023-10-02 DIAGNOSIS — R102 Pelvic and perineal pain: Secondary | ICD-10-CM | POA: Insufficient documentation

## 2023-10-02 DIAGNOSIS — Z043 Encounter for examination and observation following other accident: Secondary | ICD-10-CM | POA: Diagnosis not present

## 2023-10-03 ENCOUNTER — Telehealth: Payer: Self-pay

## 2023-10-03 NOTE — Telephone Encounter (Signed)
 Pt made aware that she will be  contacted once results are available.

## 2023-10-06 ENCOUNTER — Ambulatory Visit: Payer: Self-pay | Admitting: Obstetrics and Gynecology

## 2023-10-11 ENCOUNTER — Ambulatory Visit: Admitting: Obstetrics and Gynecology

## 2023-11-07 ENCOUNTER — Ambulatory Visit

## 2023-11-14 ENCOUNTER — Encounter: Admitting: Family Medicine

## 2023-11-15 ENCOUNTER — Ambulatory Visit

## 2023-11-21 ENCOUNTER — Other Ambulatory Visit: Payer: Self-pay

## 2023-11-21 ENCOUNTER — Ambulatory Visit

## 2023-11-21 DIAGNOSIS — Z3042 Encounter for surveillance of injectable contraceptive: Secondary | ICD-10-CM

## 2023-11-21 MED ORDER — MEDROXYPROGESTERONE ACETATE 150 MG/ML IM SUSP: INTRAMUSCULAR | 0 refills | Status: AC

## 2023-11-22 ENCOUNTER — Ambulatory Visit

## 2023-11-27 ENCOUNTER — Encounter: Admitting: Family Medicine

## 2023-11-29 ENCOUNTER — Encounter: Payer: Self-pay | Admitting: Family Medicine

## 2023-12-07 ENCOUNTER — Encounter: Admitting: Family Medicine

## 2024-02-08 DIAGNOSIS — F411 Generalized anxiety disorder: Secondary | ICD-10-CM | POA: Diagnosis not present

## 2024-02-08 DIAGNOSIS — F332 Major depressive disorder, recurrent severe without psychotic features: Secondary | ICD-10-CM | POA: Diagnosis not present

## 2024-02-15 ENCOUNTER — Ambulatory Visit: Admitting: Physician Assistant

## 2024-03-01 ENCOUNTER — Ambulatory Visit: Admitting: Obstetrics

## 2024-03-03 DIAGNOSIS — M2391 Unspecified internal derangement of right knee: Secondary | ICD-10-CM | POA: Diagnosis not present

## 2024-03-03 DIAGNOSIS — M25561 Pain in right knee: Secondary | ICD-10-CM | POA: Diagnosis not present

## 2024-03-03 DIAGNOSIS — S82201A Unspecified fracture of shaft of right tibia, initial encounter for closed fracture: Secondary | ICD-10-CM | POA: Diagnosis not present

## 2024-03-03 DIAGNOSIS — M25461 Effusion, right knee: Secondary | ICD-10-CM | POA: Diagnosis not present

## 2024-03-06 DIAGNOSIS — S82141A Displaced bicondylar fracture of right tibia, initial encounter for closed fracture: Secondary | ICD-10-CM | POA: Diagnosis not present

## 2024-03-11 ENCOUNTER — Ambulatory Visit: Admitting: Obstetrics

## 2024-03-13 DIAGNOSIS — M25561 Pain in right knee: Secondary | ICD-10-CM | POA: Diagnosis not present

## 2024-03-13 DIAGNOSIS — S82201A Unspecified fracture of shaft of right tibia, initial encounter for closed fracture: Secondary | ICD-10-CM | POA: Diagnosis not present

## 2024-03-20 DIAGNOSIS — S83282D Other tear of lateral meniscus, current injury, left knee, subsequent encounter: Secondary | ICD-10-CM | POA: Diagnosis not present

## 2024-03-20 DIAGNOSIS — S82292D Other fracture of shaft of left tibia, subsequent encounter for closed fracture with routine healing: Secondary | ICD-10-CM | POA: Diagnosis not present

## 2024-03-20 DIAGNOSIS — S83512D Sprain of anterior cruciate ligament of left knee, subsequent encounter: Secondary | ICD-10-CM | POA: Diagnosis not present

## 2024-04-10 DIAGNOSIS — M25661 Stiffness of right knee, not elsewhere classified: Secondary | ICD-10-CM | POA: Diagnosis not present

## 2024-04-10 DIAGNOSIS — S82141D Displaced bicondylar fracture of right tibia, subsequent encounter for closed fracture with routine healing: Secondary | ICD-10-CM | POA: Diagnosis not present

## 2024-04-10 DIAGNOSIS — M25361 Other instability, right knee: Secondary | ICD-10-CM | POA: Diagnosis not present
